# Patient Record
Sex: Female | Born: 1955
Health system: Southern US, Community
[De-identification: ages and names within clinical notes are randomized; demographics above are authoritative.]

## PROBLEM LIST (undated history)

## (undated) DIAGNOSIS — E119 Type 2 diabetes mellitus without complications: Secondary | ICD-10-CM

## (undated) DIAGNOSIS — L309 Dermatitis, unspecified: Secondary | ICD-10-CM

## (undated) DIAGNOSIS — D649 Anemia, unspecified: Secondary | ICD-10-CM

## (undated) DIAGNOSIS — R519 Headache, unspecified: Secondary | ICD-10-CM

## (undated) DIAGNOSIS — M1990 Unspecified osteoarthritis, unspecified site: Secondary | ICD-10-CM

## (undated) DIAGNOSIS — M109 Gout, unspecified: Secondary | ICD-10-CM

## (undated) DIAGNOSIS — I83019 Varicose veins of right lower extremity with ulcer of unspecified site: Secondary | ICD-10-CM

## (undated) DIAGNOSIS — N189 Chronic kidney disease, unspecified: Secondary | ICD-10-CM

## (undated) DIAGNOSIS — I10 Essential (primary) hypertension: Secondary | ICD-10-CM

## (undated) DIAGNOSIS — K219 Gastro-esophageal reflux disease without esophagitis: Secondary | ICD-10-CM

## (undated) DIAGNOSIS — I890 Lymphedema, not elsewhere classified: Secondary | ICD-10-CM

## (undated) HISTORY — PX: LEG SURGERY: SHX1003

## (undated) HISTORY — PX: OTHER SURGICAL HISTORY: SHX169

## (undated) HISTORY — PX: DG RIGHT TIBIA AND FIBULA (ARMC HX): HXRAD1598

---

## 2007-06-01 ENCOUNTER — Emergency Department: Payer: Self-pay

## 2007-10-29 ENCOUNTER — Ambulatory Visit: Payer: Self-pay | Admitting: Gastroenterology

## 2010-01-12 DIAGNOSIS — IMO0002 Reserved for concepts with insufficient information to code with codable children: Secondary | ICD-10-CM | POA: Insufficient documentation

## 2010-01-12 DIAGNOSIS — R000 Tachycardia, unspecified: Secondary | ICD-10-CM | POA: Insufficient documentation

## 2010-01-12 DIAGNOSIS — S129XXA Fracture of neck, unspecified, initial encounter: Secondary | ICD-10-CM | POA: Insufficient documentation

## 2010-01-12 DIAGNOSIS — E0590 Thyrotoxicosis, unspecified without thyrotoxic crisis or storm: Secondary | ICD-10-CM | POA: Insufficient documentation

## 2010-05-01 DIAGNOSIS — T847XXA Infection and inflammatory reaction due to other internal orthopedic prosthetic devices, implants and grafts, initial encounter: Secondary | ICD-10-CM | POA: Insufficient documentation

## 2010-06-04 ENCOUNTER — Ambulatory Visit: Payer: Self-pay | Admitting: Orthopaedic Surgery

## 2014-11-11 ENCOUNTER — Inpatient Hospital Stay: Payer: Self-pay | Admitting: Specialist

## 2014-11-11 LAB — CBC
HCT: 40.1 % (ref 35.0–47.0)
HGB: 13.2 g/dL (ref 12.0–16.0)
MCH: 29 pg (ref 26.0–34.0)
MCHC: 32.9 g/dL (ref 32.0–36.0)
MCV: 88 fL (ref 80–100)
Platelet: 283 10*3/uL (ref 150–440)
RBC: 4.55 10*6/uL (ref 3.80–5.20)
RDW: 14.5 % (ref 11.5–14.5)
WBC: 11 10*3/uL (ref 3.6–11.0)

## 2014-11-11 LAB — COMPREHENSIVE METABOLIC PANEL
ALK PHOS: 97 U/L
ALT: 24 U/L
Albumin: 3 g/dL — ABNORMAL LOW (ref 3.4–5.0)
Anion Gap: 4 — ABNORMAL LOW (ref 7–16)
BILIRUBIN TOTAL: 0.6 mg/dL (ref 0.2–1.0)
BUN: 27 mg/dL — ABNORMAL HIGH (ref 7–18)
CALCIUM: 9.4 mg/dL (ref 8.5–10.1)
CHLORIDE: 99 mmol/L (ref 98–107)
CO2: 31 mmol/L (ref 21–32)
CREATININE: 1.23 mg/dL (ref 0.60–1.30)
GFR CALC AF AMER: 58 — AB
GFR CALC NON AF AMER: 47 — AB
Glucose: 115 mg/dL — ABNORMAL HIGH (ref 65–99)
Osmolality: 274 (ref 275–301)
Potassium: 3.4 mmol/L — ABNORMAL LOW (ref 3.5–5.1)
SGOT(AST): 30 U/L (ref 15–37)
Sodium: 134 mmol/L — ABNORMAL LOW (ref 136–145)
Total Protein: 8.8 g/dL — ABNORMAL HIGH (ref 6.4–8.2)

## 2014-11-12 LAB — CBC WITH DIFFERENTIAL/PLATELET
BASOS PCT: 0.4 %
Basophil #: 0 10*3/uL (ref 0.0–0.1)
EOS PCT: 1.3 %
Eosinophil #: 0.1 10*3/uL (ref 0.0–0.7)
HCT: 35 % (ref 35.0–47.0)
HGB: 11.5 g/dL — ABNORMAL LOW (ref 12.0–16.0)
LYMPHS PCT: 19.3 %
Lymphocyte #: 2 10*3/uL (ref 1.0–3.6)
MCH: 28.7 pg (ref 26.0–34.0)
MCHC: 32.9 g/dL (ref 32.0–36.0)
MCV: 87 fL (ref 80–100)
MONOS PCT: 13 %
Monocyte #: 1.3 x10 3/mm — ABNORMAL HIGH (ref 0.2–0.9)
NEUTROS ABS: 6.7 10*3/uL — AB (ref 1.4–6.5)
NEUTROS PCT: 66 %
Platelet: 251 10*3/uL (ref 150–440)
RBC: 4.01 10*6/uL (ref 3.80–5.20)
RDW: 14.3 % (ref 11.5–14.5)
WBC: 10.2 10*3/uL (ref 3.6–11.0)

## 2014-11-12 LAB — BASIC METABOLIC PANEL
Anion Gap: 8 (ref 7–16)
BUN: 27 mg/dL — ABNORMAL HIGH (ref 7–18)
CHLORIDE: 101 mmol/L (ref 98–107)
CO2: 29 mmol/L (ref 21–32)
Calcium, Total: 8.5 mg/dL (ref 8.5–10.1)
Creatinine: 1.21 mg/dL (ref 0.60–1.30)
EGFR (African American): 59 — ABNORMAL LOW
EGFR (Non-African Amer.): 48 — ABNORMAL LOW
Glucose: 163 mg/dL — ABNORMAL HIGH (ref 65–99)
Osmolality: 284 (ref 275–301)
Potassium: 3.4 mmol/L — ABNORMAL LOW (ref 3.5–5.1)
Sodium: 138 mmol/L (ref 136–145)

## 2014-11-13 LAB — POTASSIUM: Potassium: 3.7 mmol/L (ref 3.5–5.1)

## 2014-11-16 LAB — CULTURE, BLOOD (SINGLE)

## 2014-11-25 DIAGNOSIS — I890 Lymphedema, not elsewhere classified: Secondary | ICD-10-CM | POA: Insufficient documentation

## 2014-11-25 DIAGNOSIS — IMO0002 Reserved for concepts with insufficient information to code with codable children: Secondary | ICD-10-CM | POA: Insufficient documentation

## 2015-01-17 DIAGNOSIS — R05 Cough: Secondary | ICD-10-CM | POA: Insufficient documentation

## 2015-01-17 DIAGNOSIS — T464X5A Adverse effect of angiotensin-converting-enzyme inhibitors, initial encounter: Secondary | ICD-10-CM | POA: Insufficient documentation

## 2015-01-17 DIAGNOSIS — I10 Essential (primary) hypertension: Secondary | ICD-10-CM | POA: Insufficient documentation

## 2015-01-17 DIAGNOSIS — R058 Other specified cough: Secondary | ICD-10-CM | POA: Insufficient documentation

## 2015-01-17 DIAGNOSIS — L309 Dermatitis, unspecified: Secondary | ICD-10-CM | POA: Insufficient documentation

## 2015-01-17 DIAGNOSIS — M19271 Secondary osteoarthritis, right ankle and foot: Secondary | ICD-10-CM | POA: Insufficient documentation
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
## 2015-09-08 ENCOUNTER — Ambulatory Visit (INDEPENDENT_AMBULATORY_CARE_PROVIDER_SITE_OTHER): Payer: BC Managed Care – PPO | Admitting: Physician Assistant

## 2015-09-08 ENCOUNTER — Encounter: Payer: Self-pay | Admitting: Physician Assistant

## 2015-09-08 VITALS — BP 140/80 | HR 83 | Temp 97.6°F | Resp 16 | Ht 62.0 in | Wt 251.2 lb

## 2015-09-08 DIAGNOSIS — Z0000 Encounter for general adult medical examination without abnormal findings: Secondary | ICD-10-CM

## 2015-09-08 DIAGNOSIS — Z7189 Other specified counseling: Secondary | ICD-10-CM

## 2015-09-08 DIAGNOSIS — I890 Lymphedema, not elsewhere classified: Secondary | ICD-10-CM

## 2015-09-08 DIAGNOSIS — E0590 Thyrotoxicosis, unspecified without thyrotoxic crisis or storm: Secondary | ICD-10-CM

## 2015-09-08 DIAGNOSIS — Z7689 Persons encountering health services in other specified circumstances: Secondary | ICD-10-CM

## 2015-09-08 DIAGNOSIS — Z23 Encounter for immunization: Secondary | ICD-10-CM | POA: Diagnosis not present

## 2015-09-08 DIAGNOSIS — I10 Essential (primary) hypertension: Secondary | ICD-10-CM | POA: Diagnosis not present

## 2015-09-08 MED ORDER — FUROSEMIDE 20 MG PO TABS: 20.0000 mg | ORAL_TABLET | Freq: Every day | ORAL | Status: DC

## 2015-09-08 MED ORDER — LOSARTAN POTASSIUM 50 MG PO TABS: 50.0000 mg | ORAL_TABLET | Freq: Every day | ORAL | Status: DC

## 2015-09-08 MED ORDER — METOPROLOL TARTRATE 25 MG PO TABS: 25.0000 mg | ORAL_TABLET | Freq: Every day | ORAL | Status: DC

## 2015-09-08 MED ORDER — TRIAMCINOLONE ACETONIDE 0.1 % EX OINT: TOPICAL_OINTMENT | Freq: Two times a day (BID) | CUTANEOUS | Status: DC

## 2015-09-08 MED ORDER — AMLODIPINE BESYLATE 2.5 MG PO TABS: 2.5000 mg | ORAL_TABLET | Freq: Every day | ORAL | Status: DC

## 2015-09-08 MED ORDER — HYDROCHLOROTHIAZIDE 25 MG PO TABS: 25.0000 mg | ORAL_TABLET | Freq: Every day | ORAL | Status: DC
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
## 2015-09-08 NOTE — Patient Instructions (Addendum)
Menopause is a normal process in which your reproductive ability comes to an end. This process happens gradually over a span of months to years, usually between the ages of 48 and 55. Menopause is complete when you have missed 12 consecutive menstrual periods. It is important to talk with your health care provider about some of the most common conditions that affect postmenopausal women, such as heart disease, cancer, and bone loss (osteoporosis). Adopting a healthy lifestyle and getting preventive care can help to promote your health and wellness. Those actions can also lower your chances of developing some of these common conditions. WHAT SHOULD I KNOW ABOUT MENOPAUSE? During menopause, you may experience a number of symptoms, such as:  Moderate-to-severe hot flashes.  Night sweats.  Decrease in sex drive.  Mood swings.  Headaches.  Tiredness.  Irritability.  Memory problems.  Insomnia. Choosing to treat or not to treat menopausal changes is an individual decision that you make with your health care provider. WHAT SHOULD I KNOW ABOUT HORMONE REPLACEMENT THERAPY AND SUPPLEMENTS? Hormone therapy products are effective for treating symptoms that are associated with menopause, such as hot flashes and night sweats. Hormone replacement carries certain risks, especially as you become older. If you are thinking about using estrogen or estrogen with progestin treatments, discuss the benefits and risks with your health care provider. WHAT SHOULD I KNOW ABOUT HEART DISEASE AND STROKE? Heart disease, heart attack, and stroke become more likely as you age. This may be due, in part, to the hormonal changes that your body experiences during menopause. These can affect how your body processes dietary fats, triglycerides, and cholesterol. Heart attack and stroke are both medical emergencies. There are many things that you can do to help prevent heart disease and stroke:  Have your blood pressure  checked at least every 1-2 years. High blood pressure causes heart disease and increases the risk of stroke.  If you are 55-79 years old, ask your health care provider if you should take aspirin to prevent a heart attack or a stroke.  Do not use any tobacco products, including cigarettes, chewing tobacco, or electronic cigarettes. If you need help quitting, ask your health care provider.  It is important to eat a healthy diet and maintain a healthy weight.  Be sure to include plenty of vegetables, fruits, low-fat dairy products, and lean protein.  Avoid eating foods that are high in solid fats, added sugars, or salt (sodium).  Get regular exercise. This is one of the most important things that you can do for your health.  Try to exercise for at least 150 minutes each week. The type of exercise that you do should increase your heart rate and make you sweat. This is known as moderate-intensity exercise.  Try to do strengthening exercises at least twice each week. Do these in addition to the moderate-intensity exercise.  Know your numbers.Ask your health care provider to check your cholesterol and your blood glucose. Continue to have your blood tested as directed by your health care provider. WHAT SHOULD I KNOW ABOUT CANCER SCREENING? There are several types of cancer. Take the following steps to reduce your risk and to catch any cancer development as early as possible. Breast Cancer  Practice breast self-awareness.  This means understanding how your breasts normally appear and feel.  It also means doing regular breast self-exams. Let your health care provider know about any changes, no matter how small.  If you are 40 or older, have a clinician do a   breast exam (clinical breast exam or CBE) every year. Depending on your age, family history, and medical history, it may be recommended that you also have a yearly breast X-ray (mammogram).  If you have a family history of breast cancer,  talk with your health care provider about genetic screening.  If you are at high risk for breast cancer, talk with your health care provider about having an MRI and a mammogram every year.  Breast cancer (BRCA) gene test is recommended for women who have family members with BRCA-related cancers. Results of the assessment will determine the need for genetic counseling and BRCA1 and for BRCA2 testing. BRCA-related cancers include these types:  Breast. This occurs in males or females.  Ovarian.  Tubal. This may also be called fallopian tube cancer.  Cancer of the abdominal or pelvic lining (peritoneal cancer).  Prostate.  Pancreatic. Cervical, Uterine, and Ovarian Cancer Your health care provider may recommend that you be screened regularly for cancer of the pelvic organs. These include your ovaries, uterus, and vagina. This screening involves a pelvic exam, which includes checking for microscopic changes to the surface of your cervix (Pap test).  For women ages 21-65, health care providers may recommend a pelvic exam and a Pap test every three years. For women ages 77-65, they may recommend the Pap test and pelvic exam, combined with testing for human papilloma virus (HPV), every five years. Some types of HPV increase your risk of cervical cancer. Testing for HPV may also be done on women of any age who have unclear Pap test results.  Other health care providers may not recommend any screening for nonpregnant women who are considered low risk for pelvic cancer and have no symptoms. Ask your health care provider if a screening pelvic exam is right for you.  If you have had past treatment for cervical cancer or a condition that could lead to cancer, you need Pap tests and screening for cancer for at least 20 years after your treatment. If Pap tests have been discontinued for you, your risk factors (such as having a new sexual partner) need to be reassessed to determine if you should start having  screenings again. Some women have medical problems that increase the chance of getting cervical cancer. In these cases, your health care provider may recommend that you have screening and Pap tests more often.  If you have a family history of uterine cancer or ovarian cancer, talk with your health care provider about genetic screening.  If you have vaginal bleeding after reaching menopause, tell your health care provider.  There are currently no reliable tests available to screen for ovarian cancer. Lung Cancer Lung cancer screening is recommended for adults 3-70 years old who are at high risk for lung cancer because of a history of smoking. A yearly low-dose CT scan of the lungs is recommended if you:  Currently smoke.  Have a history of at least 30 pack-years of smoking and you currently smoke or have quit within the past 15 years. A pack-year is smoking an average of one pack of cigarettes per day for one year. Yearly screening should:  Continue until it has been 15 years since you quit.  Stop if you develop a health problem that would prevent you from having lung cancer treatment. Colorectal Cancer  This type of cancer can be detected and can often be prevented.  Routine colorectal cancer screening usually begins at age 38 and continues through age 12.  If you have  risk factors for colon cancer, your health care provider may recommend that you be screened at an earlier age.  If you have a family history of colorectal cancer, talk with your health care provider about genetic screening.  Your health care provider may also recommend using home test kits to check for hidden blood in your stool.  A small camera at the end of a tube can be used to examine your colon directly (sigmoidoscopy or colonoscopy). This is done to check for the earliest forms of colorectal cancer.  Direct examination of the colon should be repeated every 5-10 years until age 67. However, if early forms of  precancerous polyps or small growths are found or if you have a family history or genetic risk for colorectal cancer, you may need to be screened more often. Skin Cancer  Check your skin from head to toe regularly.  Monitor any moles. Be sure to tell your health care provider:  About any new moles or changes in moles, especially if there is a change in a mole's shape or color.  If you have a mole that is larger than the size of a pencil eraser.  If any of your family members has a history of skin cancer, especially at a young age, talk with your health care provider about genetic screening.  Always use sunscreen. Apply sunscreen liberally and repeatedly throughout the day.  Whenever you are outside, protect yourself by wearing long sleeves, pants, a wide-brimmed hat, and sunglasses. WHAT SHOULD I KNOW ABOUT OSTEOPOROSIS? Osteoporosis is a condition in which bone destruction happens more quickly than new bone creation. After menopause, you may be at an increased risk for osteoporosis. To help prevent osteoporosis or the bone fractures that can happen because of osteoporosis, the following is recommended:  If you are 39-61 years old, get at least 1,000 mg of calcium and at least 600 mg of vitamin D per day.  If you are older than age 16 but younger than age 7, get at least 1,200 mg of calcium and at least 600 mg of vitamin D per day.  If you are older than age 47, get at least 1,200 mg of calcium and at least 800 mg of vitamin D per day. Smoking and excessive alcohol intake increase the risk of osteoporosis. Eat foods that are rich in calcium and vitamin D, and do weight-bearing exercises several times each week as directed by your health care provider. WHAT SHOULD I KNOW ABOUT HOW MENOPAUSE AFFECTS [place]? Depression may occur at any age, but it is more common as you become older. Common symptoms of depression include:  Low or sad mood.  Changes in sleep patterns.  Changes  in appetite or eating patterns.  Feeling an overall lack of motivation or enjoyment of activities that you previously enjoyed.  Frequent crying spells. Talk with your health care provider if you think that you are experiencing depression. WHAT SHOULD I KNOW ABOUT IMMUNIZATIONS? It is important that you get and maintain your immunizations. These include:  Tetanus, diphtheria, and pertussis (Tdap) booster vaccine.  Influenza every year before the flu season begins.  Pneumonia vaccine.  Shingles vaccine. Your health care provider may also recommend other immunizations.   This information is not intended to replace advice given to you by your health care provider. Make sure you discuss any questions you have with your health care provider.   Document Released: 12/20/2005 Document Revised: 11/18/2014 Document Reviewed: 06/30/2014 Elsevier Interactive Patient Education 2016 [employer]  Hypertension Hypertension, commonly called high blood pressure, is when the force of blood pumping through your arteries is too strong. Your arteries are the blood vessels that carry blood from your heart throughout your body. A blood pressure reading consists of a higher number over a lower number, such as 110/72. The higher number (systolic) is the pressure inside your arteries when your heart pumps. The lower number (diastolic) is the pressure inside your arteries when your heart relaxes. Ideally you want your blood pressure below 120/80. Hypertension forces your heart to work harder to pump blood. Your arteries may become narrow or stiff. Having untreated or uncontrolled hypertension can cause heart attack, stroke, kidney disease, and other problems. RISK FACTORS Some risk factors for high blood pressure are controllable. Others are not.  Risk factors you cannot control include:   Race. You may be at higher risk if you are African American.  Age. Risk increases with age.  Gender. Men are at higher  risk than women before age 97 years. After age 81, women are at higher risk than men. Risk factors you can control include:  Not getting enough exercise or physical activity.  Being overweight.  Getting too much fat, sugar, calories, or salt in your diet.  Drinking too much alcohol. SIGNS AND SYMPTOMS Hypertension does not usually cause signs or symptoms. Extremely high blood pressure (hypertensive crisis) may cause headache, anxiety, shortness of breath, and nosebleed. DIAGNOSIS To check if you have hypertension, your health care provider will measure your blood pressure while you are seated, with your arm held at the level of your heart. It should be measured at least twice using the same arm. Certain conditions can cause a difference in blood pressure between your right and left arms. A blood pressure reading that is higher than normal on one occasion does not mean that you need treatment. If it is not clear whether you have high blood pressure, you may be asked to return on a different day to have your blood pressure checked again. Or, you may be asked to monitor your blood pressure at home for 1 or more weeks. TREATMENT Treating high blood pressure includes making lifestyle changes and possibly taking medicine. Living a healthy lifestyle can help lower high blood pressure. You may need to change some of your habits. Lifestyle changes may include:  Following the DASH diet. This diet is high in fruits, vegetables, and whole grains. It is low in salt, red meat, and added sugars.  Keep your sodium intake below 2,300 mg per day.  Getting at least 30-45 minutes of aerobic exercise at least 4 times per week.  Losing weight if necessary.  Not smoking.  Limiting alcoholic beverages.  Learning ways to reduce stress. Your health care provider may prescribe medicine if lifestyle changes are not enough to get your blood pressure under control, and if one of the following is true:  You are  85-51 years of age and your systolic blood pressure is above 140.  You are 68 years of age or older, and your systolic blood pressure is above 150.  Your diastolic blood pressure is above 90.  You have diabetes, and your systolic blood pressure is over 161 or your diastolic blood pressure is over 90.  You have kidney disease and your blood pressure is above 140/90.  You have heart disease and your blood pressure is above 140/90. Your personal target blood pressure may vary depending on your medical conditions, your age, and other factors.  HOME CARE INSTRUCTIONS  Have your blood pressure rechecked as directed by your health care provider.   Take medicines only as directed by your health care provider. Follow the directions carefully. Blood pressure medicines must be taken as prescribed. The medicine does not work as well when you skip doses. Skipping doses also puts you at risk for problems.  Do not smoke.   Monitor your blood pressure at home as directed by your health care provider. SEEK MEDICAL CARE IF:   You think you are having a reaction to medicines taken.  You have recurrent headaches or feel dizzy.  You have swelling in your ankles.  You have trouble with your vision. SEEK IMMEDIATE MEDICAL CARE IF:  You develop a severe headache or confusion.  You have unusual weakness, numbness, or feel faint.  You have severe chest or abdominal pain.  You vomit repeatedly.  You have trouble breathing. MAKE SURE YOU:   Understand these instructions.  Will watch your condition.  Will get help right away if you are not doing well or get worse.   This information is not intended to replace advice given to you by your health care provider. Make sure you discuss any questions you have with your health care provider.   Document Released: 10/28/2005 Document Revised: 03/14/2015 Document Reviewed: 08/20/2013 Elsevier Interactive Patient Education 2016 [employer]  [employer]  Losartan tablets What is this medicine? LOSARTAN (loe SAR tan) is used to treat high blood pressure and to reduce the risk of stroke in certain patients. This drug also slows the progression of kidney disease in patients with diabetes. This medicine may be used for other purposes; ask your health care provider or pharmacist if you have questions. What should I tell my health care provider before I take this medicine? They need to know if you have any of these conditions: -heart failure -kidney or liver disease -an unusual or allergic reaction to losartan, other medicines, foods, dyes, or preservatives -pregnant or trying to get pregnant -breast-feeding How should I use this medicine? Take this medicine by mouth with a glass of water. Follow the directions on the prescription label. This medicine can be taken with or without food. Take your doses at regular intervals. Do not take your medicine more often than directed. Talk to your pediatrician regarding the use of this medicine in children. Special care may be needed. Overdosage: If you think you have taken too much of this medicine contact a poison control center or emergency room at once. NOTE: This medicine is only for you. Do not share this medicine with others. What if I miss a dose? If you miss a dose, take it as soon as you can. If it is almost time for your next dose, take only that dose. Do not take double or extra doses. What may interact with this medicine? -blood pressure medicines -diuretics, especially triamterene, spironolactone, or amiloride -fluconazole -NSAIDs, medicines for pain and inflammation, like ibuprofen or naproxen -potassium salts or potassium supplements -rifampin This list may not describe all possible interactions. Give your health care provider a list of all the medicines, herbs, non-prescription drugs, or dietary supplements you use. Also tell them if you smoke, drink alcohol, or use illegal drugs.  Some items may interact with your medicine. What should I watch for while using this medicine? Visit your doctor or health care professional for regular checks on your progress. Check your blood pressure as directed. Ask your doctor or health care professional what your blood pressure  should be and when you should contact him or her. Call your doctor or health care professional if you notice an irregular or fast heart beat. Women should inform their doctor if they wish to become pregnant or think they might be pregnant. There is a potential for serious side effects to an unborn child, particularly in the second or third trimester. Talk to your health care professional or pharmacist for more information. You may get drowsy or dizzy. Do not drive, use machinery, or do anything that needs mental alertness until you know how this drug affects you. Do not stand or sit up quickly, especially if you are an older patient. This reduces the risk of dizzy or fainting spells. Alcohol can make you more drowsy and dizzy. Avoid alcoholic drinks. Avoid salt substitutes unless you are told otherwise by your doctor or health care professional. Do not treat yourself for coughs, colds, or pain while you are taking this medicine without asking your doctor or health care professional for advice. Some ingredients may increase your blood pressure. What side effects may I notice from receiving this medicine? Side effects that you should report to your doctor or health care professional as soon as possible: -confusion, dizziness, light headedness or fainting spells -decreased amount of urine passed -difficulty breathing or swallowing, hoarseness, or tightening of the throat -fast or irregular heart beat, palpitations, or chest pain -skin rash, itching -swelling of your face, lips, tongue, hands, or feet Side effects that usually do not require medical attention (report to your doctor or health care professional if they  continue or are bothersome): -cough -decreased sexual function or desire -headache -nasal congestion or stuffiness -nausea or stomach pain -sore or cramping muscles This list may not describe all possible side effects. Call your doctor for medical advice about side effects. You may report side effects to FDA at 1-800-FDA-1088. Where should I keep my medicine? Keep out of the reach of children. Store at room temperature between 15 and 30 degrees C (59 and 86 degrees F). Protect from light. Keep container tightly closed. Throw away any unused medicine after the expiration date. NOTE: This sheet is a summary. It may not cover all possible information. If you have questions about this medicine, talk to your doctor, pharmacist, or health care provider.    2016, Elsevier/[employer]. (2008-01-08 16:42:18)

## 2015-09-09 LAB — CBC WITH DIFFERENTIAL/PLATELET
BASOS: 0 %
Basophils Absolute: 0 10*3/uL (ref 0.0–0.2)
EOS (ABSOLUTE): 0.1 10*3/uL (ref 0.0–0.4)
EOS: 2 %
HEMATOCRIT: 38.3 % (ref 34.0–46.6)
Hemoglobin: 12.6 g/dL (ref 11.1–15.9)
IMMATURE GRANS (ABS): 0 10*3/uL (ref 0.0–0.1)
IMMATURE GRANULOCYTES: 0 %
LYMPHS: 41 %
Lymphocytes Absolute: 2.2 10*3/uL (ref 0.7–3.1)
MCH: 28.8 pg (ref 26.6–33.0)
MCHC: 32.9 g/dL (ref 31.5–35.7)
MCV: 87 fL (ref 79–97)
MONOS ABS: 0.4 10*3/uL (ref 0.1–0.9)
Monocytes: 7 %
NEUTROS PCT: 50 %
Neutrophils Absolute: 2.6 10*3/uL (ref 1.4–7.0)
PLATELETS: 283 10*3/uL (ref 150–379)
RBC: 4.38 x10E6/uL (ref 3.77–5.28)
RDW: 14.6 % (ref 12.3–15.4)
WBC: 5.3 10*3/uL (ref 3.4–10.8)

## 2015-09-09 LAB — COMPREHENSIVE METABOLIC PANEL
ALT: 12 IU/L (ref 0–32)
AST: 16 IU/L (ref 0–40)
Albumin/Globulin Ratio: 1.3 (ref 1.1–2.5)
Albumin: 4.3 g/dL (ref 3.5–5.5)
Alkaline Phosphatase: 99 IU/L (ref 39–117)
BUN / CREAT RATIO: 17 (ref 9–23)
BUN: 13 mg/dL (ref 6–24)
Bilirubin Total: 0.4 mg/dL (ref 0.0–1.2)
CALCIUM: 9.4 mg/dL (ref 8.7–10.2)
CO2: 27 mmol/L (ref 18–29)
CREATININE: 0.75 mg/dL (ref 0.57–1.00)
Chloride: 99 mmol/L (ref 97–106)
GFR, EST AFRICAN AMERICAN: 101 mL/min/{1.73_m2} (ref >59)
GFR, EST NON AFRICAN AMERICAN: 88 mL/min/{1.73_m2} (ref >59)
Globulin, Total: 3.3 g/dL (ref 1.5–4.5)
Glucose: 104 mg/dL — ABNORMAL HIGH (ref 65–99)
Potassium: 4.4 mmol/L (ref 3.5–5.2)
Sodium: 141 mmol/L (ref 136–144)
TOTAL PROTEIN: 7.6 g/dL (ref 6.0–8.5)

## 2015-09-09 LAB — TSH: TSH: 0.69 u[IU]/mL (ref 0.450–4.500)

## 2015-09-09 LAB — LIPID PANEL
CHOL/HDL RATIO: 2.3 ratio (ref 0.0–4.4)
Cholesterol, Total: 166 mg/dL (ref 100–199)
HDL: 72 mg/dL (ref >39)
LDL Calculated: 81 mg/dL (ref 0–99)
TRIGLYCERIDES: 66 mg/dL (ref 0–149)
VLDL Cholesterol Cal: 13 mg/dL (ref 5–40)

## 2015-09-11 ENCOUNTER — Telehealth: Payer: Self-pay

## 2015-09-11 NOTE — Telephone Encounter (Signed)
-----   Message from Mar Daring, PA-C sent at 09/11/2015  8:30 AM EDT ----- All labs are stable and WNL

## 2015-09-11 NOTE — Telephone Encounter (Signed)
Patient advised as directed below.  Thanks,  -Joseline 

## 2016-03-06 ENCOUNTER — Encounter: Payer: Self-pay | Admitting: Physician Assistant

## 2016-03-06 ENCOUNTER — Ambulatory Visit (INDEPENDENT_AMBULATORY_CARE_PROVIDER_SITE_OTHER): Payer: BC Managed Care – PPO | Admitting: Physician Assistant

## 2016-03-06 VITALS — BP 160/90 | HR 70 | Temp 98.0°F | Resp 16 | Wt 261.4 lb

## 2016-03-06 DIAGNOSIS — I10 Essential (primary) hypertension: Secondary | ICD-10-CM

## 2016-03-06 DIAGNOSIS — Z6841 Body Mass Index (BMI) 40.0 and over, adult: Secondary | ICD-10-CM

## 2016-03-06 MED ORDER — LOSARTAN POTASSIUM 50 MG PO TABS: 50.0000 mg | ORAL_TABLET | Freq: Every day | ORAL | Status: DC

## 2016-03-06 NOTE — Progress Notes (Signed)
Patient: Autumn Burton Female    DOB: 1956/08/07   60 y.o.   MRN: [id number] Visit Date: 03/06/2016  Today's Provider: Mar Daring, PA-C   Chief Complaint  Patient presents with  . Follow-up    Essential Hypertension   Subjective:    HPI  Hypertension, follow-up:  BP Readings from Last 3 Encounters:  03/06/16 160/90  09/08/15 140/80    She was last seen for hypertension 6 months ago.  BP at that visit was 140/80. Management since that visit included to discontinued Lisinopril. She was put on Losartan. Amlodipine, Hydrochlorothiazide and Metoprolol were refilled and to have been continued along with the Furosemide for the leg swelling (lymphedema). Patient is not taking Losartan, Amlodipine and Furosemide; doesn't remember that she needed all these medications.   She reports she has a lot going on right now. She is taking care of her husband and mother who are both sick. She states she worries about them both a lot. She does not sleep deeply because she is constantly listening for her husband's breathing. She cannot remember things to do for herself as she is constantly making sure she has taken care of them. She states she sits with her mother during the day at her sister's house and tries to take her husband there also so she only has to watch them at one place. Some days her husband refuses to go over there so she has to go back and forth throughout the day to keep checking on him as well.   She reports good compliance with treatment. She thought the only medicines she needed to be taking was the Metoprolol, Aspirin, and Hydrochlorothiazide. She is not having side effects.  She is not exercising. She is starting to have bilateral knee pain but she is morbidly obese and also has lymphedema complicating issues.  She is adherent to low salt diet.   Outside blood pressures are systolic in the upper [id number] and the diastolic upper [id number] per patient. She is using her  mother's blood pressure cuff.  She is experiencing lower extremity edema.  Patient denies chest pain/pressure, headache, dizziness,fatigue, irregular heart beat, or palpitations. Cardiovascular risk factors include hypertension and obesity (BMI >= 30 kg/m2).    Weight trend: fluctuating a bit Wt Readings from Last 3 Encounters:  03/06/16 261 lb 6.4 oz (118.57 kg)  09/08/15 251 lb 3.2 oz (113.944 kg)    Current diet: in general, an "unhealthy" diet. She states most days she is only eating dinner because she is too busy doing for everyone else.   ------------------------------------------------------------------------      No Known Allergies Previous Medications   ASPIRIN EC 81 MG TABLET    Take by mouth.   FUROSEMIDE (LASIX) 20 MG TABLET    Take 1 tablet (20 mg total) by mouth daily.   HYDROCHLOROTHIAZIDE (HYDRODIURIL) 25 MG TABLET    Take 1 tablet (25 mg total) by mouth daily.   METOPROLOL TARTRATE (LOPRESSOR) 25 MG TABLET    Take 1 tablet (25 mg total) by mouth daily.   TRIAMCINOLONE OINTMENT (KENALOG) 0.1 %    Apply topically 2 (two) times daily.    Review of Systems  Constitutional: Negative for fatigue.  Eyes: Negative for visual disturbance.  Respiratory: Negative for cough, chest tightness and shortness of breath.   Cardiovascular: Positive for leg swelling. Negative for chest pain and palpitations.  Gastrointestinal: Negative.   Endocrine: Negative for polydipsia.  Musculoskeletal: Positive for arthralgias (bilateral  knees).  Neurological: Negative for dizziness, light-headedness and headaches.    Social History  Substance Use Topics  . Smoking status: Never Smoker   . Smokeless tobacco: Never Used  . Alcohol Use: No   Objective:   BP 160/90 mmHg  Pulse 70  Temp(Src) 98 F (36.7 C) (Oral)  Resp 16  Wt 261 lb 6.4 oz (118.57 kg)  Physical Exam  Constitutional: She appears well-developed and well-nourished. No distress.  Cardiovascular: Normal rate, regular  rhythm and normal heart sounds.  Exam reveals no gallop and no friction rub.   No murmur heard. Pulmonary/Chest: Effort normal and breath sounds normal. No respiratory distress. She has no wheezes. She has no rales.  Skin: She is not diaphoretic.  Vitals reviewed.       Assessment & Plan:     1. Essential (primary) hypertension Will add losartan for better control of hypertension. She voiced understanding and reiterated the medications she is to be taking daily. I will have her return in 3 months to recheck labs and see how her BP is maintaining with this addition. She is to continue HCTZ, metoprolol as well. Discontinued Norvasc for the time being since she is not taking and has not been taking since before she established with me. She is to call if she has any acute issue, questions, concerns or adverse reaction to the medication.  - losartan (COZAAR) 50 MG tablet; Take 1 tablet (50 mg total) by mouth daily.  Dispense: 90 tablet; Refill: 0  2. Morbid obesity with BMI of 45.0-49.9, adult ([place]) Discussed trying to eat a smaller meal for dinner and work on adding breakfast to her routine to help with memory and energy during the day. Try finding time for adding physical activity in small 5 minute spurts. Consider food diary.        Mar Daring, PA-C  [facility]

## 2016-03-06 NOTE — Patient Instructions (Signed)
Losartan tablets What is this medicine? LOSARTAN (loe SAR tan) is used to treat high blood pressure and to reduce the risk of stroke in certain patients. This drug also slows the progression of kidney disease in patients with diabetes. This medicine may be used for other purposes; ask your health care provider or pharmacist if you have questions. What should I tell my health care provider before I take this medicine? They need to know if you have any of these conditions: -heart failure -kidney or liver disease -an unusual or allergic reaction to losartan, other medicines, foods, dyes, or preservatives -pregnant or trying to get pregnant -breast-feeding How should I use this medicine? Take this medicine by mouth with a glass of water. Follow the directions on the prescription label. This medicine can be taken with or without food. Take your doses at regular intervals. Do not take your medicine more often than directed. Talk to your pediatrician regarding the use of this medicine in children. Special care may be needed. Overdosage: If you think you have taken too much of this medicine contact a poison control center or emergency room at once. NOTE: This medicine is only for you. Do not share this medicine with others. What if I miss a dose? If you miss a dose, take it as soon as you can. If it is almost time for your next dose, take only that dose. Do not take double or extra doses. What may interact with this medicine? -blood pressure medicines -diuretics, especially triamterene, spironolactone, or amiloride -fluconazole -NSAIDs, medicines for pain and inflammation, like ibuprofen or naproxen -potassium salts or potassium supplements -rifampin This list may not describe all possible interactions. Give your health care provider a list of all the medicines, herbs, non-prescription drugs, or dietary supplements you use. Also tell them if you smoke, drink alcohol, or use illegal drugs. Some items  may interact with your medicine. What should I watch for while using this medicine? Visit your doctor or health care professional for regular checks on your progress. Check your blood pressure as directed. Ask your doctor or health care professional what your blood pressure should be and when you should contact him or her. Call your doctor or health care professional if you notice an irregular or fast heart beat. Women should inform their doctor if they wish to become pregnant or think they might be pregnant. There is a potential for serious side effects to an unborn child, particularly in the second or third trimester. Talk to your health care professional or pharmacist for more information. You may get drowsy or dizzy. Do not drive, use machinery, or do anything that needs mental alertness until you know how this drug affects you. Do not stand or sit up quickly, especially if you are an older patient. This reduces the risk of dizzy or fainting spells. Alcohol can make you more drowsy and dizzy. Avoid alcoholic drinks. Avoid salt substitutes unless you are told otherwise by your doctor or health care professional. Do not treat yourself for coughs, colds, or pain while you are taking this medicine without asking your doctor or health care professional for advice. Some ingredients may increase your blood pressure. What side effects may I notice from receiving this medicine? Side effects that you should report to your doctor or health care professional as soon as possible: -confusion, dizziness, light headedness or fainting spells -decreased amount of urine passed -difficulty breathing or swallowing, hoarseness, or tightening of the throat -fast or irregular heart beat, palpitations,   or chest pain -skin rash, itching -swelling of your face, lips, tongue, hands, or feet Side effects that usually do not require medical attention (report to your doctor or health care professional if they continue or are  bothersome): -cough -decreased sexual function or desire -headache -nasal congestion or stuffiness -nausea or stomach pain -sore or cramping muscles This list may not describe all possible side effects. Call your doctor for medical advice about side effects. You may report side effects to FDA at 1-800-FDA-1088. Where should I keep my medicine? Keep out of the reach of children. Store at room temperature between 15 and 30 degrees C (59 and 86 degrees F). Protect from light. Keep container tightly closed. Throw away any unused medicine after the expiration date. NOTE: This sheet is a summary. It may not cover all possible information. If you have questions about this medicine, talk to your doctor, pharmacist, or health care provider.    2016, Elsevier/Gold Standard. (2008-01-08 16:42:18)  

## 2016-03-26 ENCOUNTER — Encounter: Payer: Self-pay | Admitting: Family Medicine

## 2016-03-26 ENCOUNTER — Ambulatory Visit
Admission: RE | Admit: 2016-03-26 | Discharge: 2016-03-26 | Disposition: A | Discharge status: Home / Self Care | Payer: BC Managed Care – PPO | Source: Ambulatory Visit | Attending: Family Medicine | Admitting: Family Medicine

## 2016-03-26 ENCOUNTER — Ambulatory Visit (INDEPENDENT_AMBULATORY_CARE_PROVIDER_SITE_OTHER): Payer: BC Managed Care – PPO | Admitting: Family Medicine

## 2016-03-26 VITALS — BP 130/70 | HR 92 | Temp 97.4°F | Resp 16 | Wt 261.8 lb

## 2016-03-26 DIAGNOSIS — M25562 Pain in left knee: Secondary | ICD-10-CM | POA: Diagnosis not present

## 2016-03-26 DIAGNOSIS — M1712 Unilateral primary osteoarthritis, left knee: Secondary | ICD-10-CM | POA: Insufficient documentation

## 2016-03-26 MED ORDER — HYDROCODONE-ACETAMINOPHEN 5-325 MG PO TABS: ORAL_TABLET | ORAL | Status: DC

## 2016-03-26 MED ORDER — MELOXICAM 15 MG PO TABS: 15.0000 mg | ORAL_TABLET | Freq: Every day | ORAL | Status: DC

## 2016-03-26 NOTE — Patient Instructions (Signed)
We will call you with the x-ray report. Don't take ibuprofen or Aleve with the meloxicam. Limit daily total of tylenol to 3000 mg/day.

## 2016-03-26 NOTE — Progress Notes (Signed)
Subjective:     Patient ID: Autumn Burton, female   DOB: [birth year], 60 y.o.   MRN: [id number]  HPI  Chief Complaint  Patient presents with  . Knee Pain    Patient comes in office today with concerns of sharp stabbing knee pain that has been occuring over the period of 3 weeks. Patient states that pain is worse at night, she has noticed redness and swelling. Patient denies any injury or fall, patient reports difficulty walking and sleeping at night. Patient states that last night she took 1000mg  of Tylenol with no relief.   States redness has resolved but continues to have both W.B.and N.W.B. Pain. 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 Prior right leg injury limits w.b. On that side. Not currently using as assistive device. Has also been taking high dose ibuprofen 3 x day.   Review of Systems  Constitutional: Negative for fever and chills.       Objective:   Physical Exam  Constitutional: She appears well-developed and well-nourished. She appears distressed (pain with standing and w.b.).  Musculoskeletal:  Left knee with mild increased warmth but no sign of infection. Tender over the medial aspect with increased pain with full extension and flexion > 90 degrees. No patellar tenderness. Collateral ligaments stable.       Assessment:    1. Knee pain, acute, left: Suspect osteoarthritis flare - DG Knee Complete 4 Views Left; Future - meloxicam (MOBIC) 15 MG tablet; Take 1 tablet (15 mg total) by mouth daily.  Dispense: 30 tablet; Refill: 0 - HYDROcodone-acetaminophen (NORCO/VICODIN) 5-325 MG tablet; 1/2 to one every 4-6 hours as needed for pain  Dispense: 28 tablet; Refill: 0    Plan:    Further f/u pending x-ray with probable orthopedic referral. Discussed nsaid and tylenol use.

## 2016-03-27 ENCOUNTER — Telehealth: Payer: Self-pay

## 2016-03-27 NOTE — Telephone Encounter (Signed)
-----   Message from Carmon Ginsberg, [place] sent at 03/26/2016  4:46 PM EDT ----- Arthritic changes noted particularly on the side you are hurting. Do you wish to try the new medication for a week or two or wish to see an orthopedic doctor now.

## 2016-03-27 NOTE — Telephone Encounter (Signed)
Patient advised as directed below. Patient prefers to try medication first before proceeding with ortho referral. Pharmacy: [facility].

## 2016-05-06 ENCOUNTER — Other Ambulatory Visit: Payer: Self-pay | Admitting: Family Medicine

## 2016-06-05 ENCOUNTER — Ambulatory Visit (INDEPENDENT_AMBULATORY_CARE_PROVIDER_SITE_OTHER): Payer: BC Managed Care – PPO | Admitting: Physician Assistant

## 2016-06-05 ENCOUNTER — Encounter: Payer: Self-pay | Admitting: Physician Assistant

## 2016-06-05 VITALS — BP 132/70 | Temp 97.6°F | Resp 16 | Wt 260.0 lb

## 2016-06-05 DIAGNOSIS — L309 Dermatitis, unspecified: Secondary | ICD-10-CM

## 2016-06-05 DIAGNOSIS — I10 Essential (primary) hypertension: Secondary | ICD-10-CM | POA: Diagnosis not present

## 2016-06-05 MED ORDER — MOMETASONE FUROATE 0.1 % EX CREA: 1.0000 "application " | TOPICAL_CREAM | Freq: Two times a day (BID) | CUTANEOUS | 0 refills | Status: DC

## 2016-06-05 MED ORDER — LOSARTAN POTASSIUM 50 MG PO TABS: 50.0000 mg | ORAL_TABLET | Freq: Every day | ORAL | 1 refills | Status: DC

## 2016-06-05 NOTE — Progress Notes (Signed)
Patient: Autumn Burton Female    DOB: 11-10-1956   60 y.o.   MRN: [id number] Visit Date: 06/05/2016  Today's Provider: Mar Daring, PA-C   Chief Complaint  Patient presents with  . Hypertension    1 month follow up    Subjective:    HPI  Hypertension, follow-up:  BP Readings from Last 3 Encounters:  06/05/16 132/70  03/26/16 130/70  03/06/16 (!) 160/90    She was last seen for hypertension 3 months ago.  BP at that visit was 130/70. Management since that visit includes adding Losartan 50mg  daily. She reports good compliance with treatment. She is not having side effects.  She is not exercising. She is not adherent to low salt diet.   Outside blood pressures are not being checked. Patient denies chest pain, exertional chest pressure/discomfort, fatigue, irregular heart beat, palpitations and tachypnea.       Weight trend: stable Wt Readings from Last 3 Encounters:  06/05/16 260 lb (117.9 kg)  03/26/16 261 lb 12.8 oz (118.8 kg)  03/06/16 261 lb 6.4 oz (118.6 kg)    Current diet: well balanced  ------------------------------------------------------------------------      No Known Allergies Current Meds  Medication Sig  . aspirin EC 81 MG tablet Take by mouth.  . furosemide (LASIX) 20 MG tablet Take 1 tablet (20 mg total) by mouth daily.  . hydrochlorothiazide (HYDRODIURIL) 25 MG tablet Take 1 tablet (25 mg total) by mouth daily.  Marland Kitchen HYDROcodone-acetaminophen (NORCO/VICODIN) 5-325 MG tablet 1/2 to one every 4-6 hours as needed for pain  . losartan (COZAAR) 50 MG tablet Take 1 tablet (50 mg total) by mouth daily.  . meloxicam (MOBIC) 15 MG tablet TAKE ONE TABLET BY MOUTH ONCE DAILY  . metoprolol tartrate (LOPRESSOR) 25 MG tablet Take 1 tablet (25 mg total) by mouth daily.  Marland Kitchen triamcinolone ointment (KENALOG) 0.1 % Apply topically 2 (two) times daily. (Patient taking differently: Apply topically 2 (two) times daily. )    Review of Systems    Constitutional: Negative.   Respiratory: Negative.   Cardiovascular: Negative.   Musculoskeletal: Negative.   Skin: Negative.     Social History  Substance Use Topics  . Smoking status: Never Smoker  . Smokeless tobacco: Never Used  . Alcohol use No   Objective:   BP 132/70 (BP Location: Right Arm, Patient Position: Sitting, Cuff Size: Large)   Temp 97.6 F (36.4 C)   Resp 16   Wt 260 lb (117.9 kg)   BMI 47.55 kg/m   Physical Exam  Constitutional: She appears well-developed and well-nourished. No distress.  Neck: Normal range of motion. Neck supple. No tracheal deviation present. No thyromegaly present.  Cardiovascular: Normal rate, regular rhythm and normal heart sounds.  Exam reveals no gallop and no friction rub.   No murmur heard. Pulmonary/Chest: Effort normal and breath sounds normal. No respiratory distress. She has no wheezes. She has no rales.  Lymphadenopathy:    She has no cervical adenopathy.  Skin: She is not diaphoretic.  Vitals reviewed.     Assessment & Plan:     1. Essential (primary) hypertension Improved and stable. Diagnosis pulled for medication refill. Continue current medical treatment plan. Will check labs as below and f/u pending results. I will see her back in 3 months for CPE. - losartan (COZAAR) 50 MG tablet; Take 1 tablet (50 mg total) by mouth daily.  Dispense: 90 tablet; Refill: 1 - Basic metabolic panel - CBC  2. Eczema Uncontrolled with triamcinolone cream. WIll try mometasone as below. If still no improvement she is to call the office as we may refer to dermatology. - mometasone (ELOCON) 0.1 % cream; Apply 1 application topically 2 (two) times daily. To affected areas  Dispense: 50 g; Refill: 0  The entirety of the information documented in the History of Present Illness, Review of Systems and Physical Exam were personally obtained by me. Portions of this information were initially documented by Wilburt Finlay, CMA and reviewed by me  for thoroughness and accuracy.      Mar Daring, PA-C  [facility]

## 2016-06-05 NOTE — Patient Instructions (Signed)
Hypertension Hypertension, commonly called high blood pressure, is when the force of blood pumping through your arteries is too strong. Your arteries are the blood vessels that carry blood from your heart throughout your body. A blood pressure reading consists of a higher number over a lower number, such as 110/72. The higher number (systolic) is the pressure inside your arteries when your heart pumps. The lower number (diastolic) is the pressure inside your arteries when your heart relaxes. Ideally you want your blood pressure below 120/80. Hypertension forces your heart to work harder to pump blood. Your arteries may become narrow or stiff. Having untreated or uncontrolled hypertension can cause heart attack, stroke, kidney disease, and other problems. RISK FACTORS Some risk factors for high blood pressure are controllable. Others are not.  Risk factors you cannot control include:   Race. You may be at higher risk if you are African American.  Age. Risk increases with age.  Gender. Men are at higher risk than women before age 45 years. After age 65, women are at higher risk than men. Risk factors you can control include:  Not getting enough exercise or physical activity.  Being overweight.  Getting too much fat, sugar, calories, or salt in your diet.  Drinking too much alcohol. SIGNS AND SYMPTOMS Hypertension does not usually cause signs or symptoms. Extremely high blood pressure (hypertensive crisis) may cause headache, anxiety, shortness of breath, and nosebleed. DIAGNOSIS To check if you have hypertension, your health care provider will measure your blood pressure while you are seated, with your arm held at the level of your heart. It should be measured at least twice using the same arm. Certain conditions can cause a difference in blood pressure between your right and left arms. A blood pressure reading that is higher than normal on one occasion does not mean that you need treatment. If  it is not clear whether you have high blood pressure, you may be asked to return on a different day to have your blood pressure checked again. Or, you may be asked to monitor your blood pressure at home for 1 or more weeks. TREATMENT Treating high blood pressure includes making lifestyle changes and possibly taking medicine. Living a healthy lifestyle can help lower high blood pressure. You may need to change some of your habits. Lifestyle changes may include:  Following the DASH diet. This diet is high in fruits, vegetables, and whole grains. It is low in salt, red meat, and added sugars.  Keep your sodium intake below 2,300 mg per day.  Getting at least 30-45 minutes of aerobic exercise at least 4 times per week.  Losing weight if necessary.  Not smoking.  Limiting alcoholic beverages.  Learning ways to reduce stress. Your health care provider may prescribe medicine if lifestyle changes are not enough to get your blood pressure under control, and if one of the following is true:  You are 18-59 years of age and your systolic blood pressure is above 140.  You are 60 years of age or older, and your systolic blood pressure is above 150.  Your diastolic blood pressure is above 90.  You have diabetes, and your systolic blood pressure is over 140 or your diastolic blood pressure is over 90.  You have kidney disease and your blood pressure is above 140/90.  You have heart disease and your blood pressure is above 140/90. Your personal target blood pressure may vary depending on your medical conditions, your age, and other factors. HOME CARE INSTRUCTIONS    Have your blood pressure rechecked as directed by your health care provider.   Take medicines only as directed by your health care provider. Follow the directions carefully. Blood pressure medicines must be taken as prescribed. The medicine does not work as well when you skip doses. Skipping doses also puts you at risk for  problems.  Do not smoke.   Monitor your blood pressure at home as directed by your health care provider. SEEK MEDICAL CARE IF:   You think you are having a reaction to medicines taken.  You have recurrent headaches or feel dizzy.  You have swelling in your ankles.  You have trouble with your vision. SEEK IMMEDIATE MEDICAL CARE IF:  You develop a severe headache or confusion.  You have unusual weakness, numbness, or feel faint.  You have severe chest or abdominal pain.  You vomit repeatedly.  You have trouble breathing. MAKE SURE YOU:   Understand these instructions.  Will watch your condition.  Will get help right away if you are not doing well or get worse.   This information is not intended to replace advice given to you by your health care provider. Make sure you discuss any questions you have with your health care provider.   Document Released: 10/28/2005 Document Revised: 03/14/2015 Document Reviewed: 08/20/2013 Elsevier Interactive Patient Education 2016 Elsevier Inc.  

## 2016-06-06 LAB — BASIC METABOLIC PANEL
BUN / CREAT RATIO: 18 (ref 12–28)
BUN: 16 mg/dL (ref 8–27)
CALCIUM: 9.5 mg/dL (ref 8.7–10.3)
CO2: 27 mmol/L (ref 18–29)
CREATININE: 0.9 mg/dL (ref 0.57–1.00)
Chloride: 96 mmol/L (ref 96–106)
GFR calc Af Amer: 80 mL/min/{1.73_m2} (ref >59)
GFR calc non Af Amer: 70 mL/min/{1.73_m2} (ref >59)
GLUCOSE: 109 mg/dL — AB (ref 65–99)
Potassium: 3.8 mmol/L (ref 3.5–5.2)
Sodium: 141 mmol/L (ref 134–144)

## 2016-06-06 LAB — CBC
HEMATOCRIT: 36.7 % (ref 34.0–46.6)
HEMOGLOBIN: 11.9 g/dL (ref 11.1–15.9)
MCH: 28.3 pg (ref 26.6–33.0)
MCHC: 32.4 g/dL (ref 31.5–35.7)
MCV: 87 fL (ref 79–97)
Platelets: 346 10*3/uL (ref 150–379)
RBC: 4.2 x10E6/uL (ref 3.77–5.28)
RDW: 15.1 % (ref 12.3–15.4)
WBC: 6.3 10*3/uL (ref 3.4–10.8)

## 2016-07-08 ENCOUNTER — Telehealth: Payer: Self-pay | Admitting: Physician Assistant

## 2016-07-08 NOTE — Telephone Encounter (Signed)
Pt called needing refill on her execma medication cream.  She wants to large jar. ( [place]) ?  She uses [address].  Her call back is [phone]  Thanks Con Memos

## 2016-07-09 ENCOUNTER — Other Ambulatory Visit: Payer: Self-pay | Admitting: Family Medicine

## 2016-07-09 DIAGNOSIS — L309 Dermatitis, unspecified: Secondary | ICD-10-CM

## 2016-07-09 MED ORDER — TRIAMCINOLONE ACETONIDE 0.025 % EX OINT: 1.0000 "application " | TOPICAL_OINTMENT | Freq: Two times a day (BID) | CUTANEOUS | 2 refills | Status: DC

## 2016-07-09 NOTE — Telephone Encounter (Signed)
done

## 2016-07-09 NOTE — Telephone Encounter (Signed)
I am not sure if this is the same thing that [facility] sent back yesterday.  Pt states they are not the same Rx.    Pt contacted office for refill request on the following medications:  TRIAMCINOLONE OINTMENT (KENALOG) 0.1 %.  Pt is requesting the 1 pound jar.  [place]  CB#[id number]/MW

## 2016-07-09 NOTE — Telephone Encounter (Signed)
Please review. KW 

## 2016-07-09 NOTE — Telephone Encounter (Signed)
Spoke with patient on the phone she states that she is request a pound of triamcinolone oint, she states that she takes this for maintenance. Patient stated that when she saw Patrici Ranks her skin was so bad that she agreed to try the Mometasone cream.

## 2016-07-09 NOTE — Telephone Encounter (Signed)
Please clarify with the patient. Jennie switched her to Mometasone cream at the end of last month.

## 2016-09-04 ENCOUNTER — Ambulatory Visit (INDEPENDENT_AMBULATORY_CARE_PROVIDER_SITE_OTHER): Payer: BC Managed Care – PPO | Admitting: Physician Assistant

## 2016-09-04 ENCOUNTER — Encounter: Payer: Self-pay | Admitting: Physician Assistant

## 2016-09-04 VITALS — BP 120/70 | HR 85 | Temp 97.8°F | Resp 16 | Ht 60.0 in | Wt 264.0 lb

## 2016-09-04 DIAGNOSIS — Z833 Family history of diabetes mellitus: Secondary | ICD-10-CM | POA: Diagnosis not present

## 2016-09-04 DIAGNOSIS — Z1211 Encounter for screening for malignant neoplasm of colon: Secondary | ICD-10-CM

## 2016-09-04 DIAGNOSIS — I10 Essential (primary) hypertension: Secondary | ICD-10-CM

## 2016-09-04 DIAGNOSIS — Z23 Encounter for immunization: Secondary | ICD-10-CM | POA: Diagnosis not present

## 2016-09-04 DIAGNOSIS — Z1159 Encounter for screening for other viral diseases: Secondary | ICD-10-CM

## 2016-09-04 DIAGNOSIS — Z0000 Encounter for general adult medical examination without abnormal findings: Secondary | ICD-10-CM | POA: Diagnosis not present

## 2016-09-04 DIAGNOSIS — I890 Lymphedema, not elsewhere classified: Secondary | ICD-10-CM | POA: Diagnosis not present

## 2016-09-04 DIAGNOSIS — Z13220 Encounter for screening for lipoid disorders: Secondary | ICD-10-CM

## 2016-09-04 DIAGNOSIS — L309 Dermatitis, unspecified: Secondary | ICD-10-CM

## 2016-09-04 DIAGNOSIS — E0590 Thyrotoxicosis, unspecified without thyrotoxic crisis or storm: Secondary | ICD-10-CM | POA: Diagnosis not present

## 2016-09-04 DIAGNOSIS — Z124 Encounter for screening for malignant neoplasm of cervix: Secondary | ICD-10-CM

## 2016-09-04 DIAGNOSIS — Z136 Encounter for screening for cardiovascular disorders: Secondary | ICD-10-CM

## 2016-09-04 DIAGNOSIS — Z1231 Encounter for screening mammogram for malignant neoplasm of breast: Secondary | ICD-10-CM | POA: Diagnosis not present

## 2016-09-04 DIAGNOSIS — Z1239 Encounter for other screening for malignant neoplasm of breast: Secondary | ICD-10-CM

## 2016-09-04 LAB — POCT URINALYSIS DIPSTICK
Bilirubin, UA: NEGATIVE
Blood, UA: NEGATIVE
Glucose, UA: NEGATIVE
Ketones, UA: NEGATIVE
LEUKOCYTES UA: NEGATIVE
NITRITE UA: NEGATIVE
PH UA: 5
PROTEIN UA: NEGATIVE
Spec Grav, UA: 1.02
Urobilinogen, UA: 0.2

## 2016-09-04 MED ORDER — FUROSEMIDE 20 MG PO TABS: 20.0000 mg | ORAL_TABLET | Freq: Every day | ORAL | 3 refills | Status: DC

## 2016-09-04 MED ORDER — LOSARTAN POTASSIUM 50 MG PO TABS
50.0000 mg | ORAL_TABLET | Freq: Every day | ORAL | 3 refills | Status: DC
Start: 2016-09-04 — End: 2017-09-09

## 2016-09-04 MED ORDER — PREDNISONE 10 MG PO TABS: ORAL_TABLET | ORAL | 0 refills | Status: DC

## 2016-09-04 MED ORDER — TRIAMCINOLONE ACETONIDE 0.025 % EX OINT: 1.0000 "application " | TOPICAL_OINTMENT | Freq: Two times a day (BID) | CUTANEOUS | 3 refills | Status: DC

## 2016-09-04 MED ORDER — METOPROLOL TARTRATE 25 MG PO TABS: 25.0000 mg | ORAL_TABLET | Freq: Every day | ORAL | 3 refills | Status: DC

## 2016-09-04 MED ORDER — HYDROCHLOROTHIAZIDE 25 MG PO TABS: 25.0000 mg | ORAL_TABLET | Freq: Every day | ORAL | 3 refills | Status: DC

## 2016-09-04 NOTE — Patient Instructions (Signed)

## 2016-09-04 NOTE — Progress Notes (Signed)
Patient: Autumn Burton, Female    DOB: [birth year], 60 y.o.   MRN: [id number] Visit Date: 09/04/2016  Today's Provider: Mar Daring, PA-C   Chief Complaint  Patient presents with  . Annual Exam   Subjective:    Annual physical exam Autumn Burton is a 60 y.o. female who presents today for health maintenance and complete physical. She feels well. She reports exercising 2 days a week. She reports she is sleeping well.  CPE:09/08/15 Pap-04/01/2011 Neg ([facility]) Mammogram:04/07/15-BI-RADS 1 colonscopy done in 09/2011 per pt and need to be recheck in 5 yrs.   Hypertension, follow-up:  BP Readings from Last 3 Encounters:  09/04/16 120/70  06/05/16 132/70  03/26/16 130/70    She was last seen for hypertension 6 months ago.  BP at that visit was 160/90. Management since that visit includes added Losartan. Continue HCTZ,Metoprolol and D/c Norvasc. She reports excellent compliance with treatment. She is not having side effects.  She is exercising. She is adherent to low salt diet.   Outside blood pressures are stable. She is experiencing lower extremity edema.  Patient denies chest pain.   Cardiovascular risk factors include hypertension and obesity (BMI >= 30 kg/m2).  Use of agents associated with hypertension: none.     Weight trend: stable Wt Readings from Last 3 Encounters:  09/04/16 264 lb (119.7 kg)  06/05/16 260 lb (117.9 kg)  03/26/16 261 lb 12.8 oz (118.8 kg)    Current diet: in general, an "unhealthy" diet  ------------------------------------------------------------------------  Patient c/o itchy rash on toes. Patient reports it is worsening.   Review of Systems  Constitutional: Negative.   HENT: Negative.   Eyes: Negative.   Respiratory: Negative.   Cardiovascular: Negative.   Gastrointestinal: Negative.   Endocrine: Negative.   Genitourinary: Negative.   Musculoskeletal: Negative.   Skin: Positive for rash.    Allergic/Immunologic: Negative.   Neurological: Negative.   Hematological: Negative.   Psychiatric/Behavioral: Negative.     Social History      She  reports that she has never smoked. She has never used smokeless tobacco. She reports that she does not drink alcohol or use drugs.       Social History   Social History  . Marital status: Married    Spouse name: N/A  . Number of children: 1  . Years of education: N/A   Social History Main Topics  . Smoking status: Never Smoker  . Smokeless tobacco: Never Used  . Alcohol use No  . Drug use: No  . Sexual activity: Not Asked   Other Topics Concern  . None   Social History Narrative  . None    History reviewed. No pertinent past medical history.   Patient Active Problem List   Diagnosis Date Noted  . Morbid obesity with BMI of 45.0-49.9, adult ([place]) 03/06/2016  . Chronic eczema 01/17/2015  . Adverse effect of angiotensin-converting enzyme inhibitor 01/17/2015  . Essential (primary) hypertension 01/17/2015  . Secondary osteoarthritis of ankle 01/17/2015  . Abscess or cellulitis of leg 11/25/2014  . Acquired lymphedema of leg 11/25/2014  . 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 Pseudoarthrosis of cervical spine ([place]) 01/12/2010  . Hyperthyroidism, subclinical 01/12/2010  . Fast heart beat 01/12/2010    Past Surgical History:  Procedure Laterality Date  . DG RIGHT TIBIA AND FIBULA ([place] HX)    . LEG SURGERY Right     Family History  Family Status  Relation Status  . Mother Alive  . Father Deceased at age 63   cause of death was Alzheimer's Disease  . Sister Alive  . Son Alive        Her family history includes COPD in her mother; Diabetes in her mother; Healthy in her sister and son; Heart disease in her mother.    No Known Allergies  Current Meds  Medication Sig  . aspirin EC 81 MG tablet Take by mouth.  . furosemide (LASIX) 20 MG  tablet Take 1 tablet (20 mg total) by mouth daily.  . hydrochlorothiazide (HYDRODIURIL) 25 MG tablet Take 1 tablet (25 mg total) by mouth daily.  Marland Kitchen losartan (COZAAR) 50 MG tablet Take 1 tablet (50 mg total) by mouth daily.  . metoprolol tartrate (LOPRESSOR) 25 MG tablet Take 1 tablet (25 mg total) by mouth daily.  Marland Kitchen triamcinolone (KENALOG) 0.025 % ointment Apply 1 application topically 2 (two) times daily. As needed for eczema    Patient Care Team: Mar Daring, PA-C as PCP - General (Family Medicine)     Objective:   Vitals: BP 120/70 (BP Location: Left Arm, Patient Position: Sitting, Cuff Size: Large)   Pulse 85   Temp 97.8 F (36.6 C) (Oral)   Resp 16   Ht 5' (1.524 m)   Wt 264 lb (119.7 kg)   SpO2 100%   BMI 51.56 kg/m    Physical Exam  Constitutional: She is oriented to person, place, and time. She appears well-developed and well-nourished. No distress.  HENT:  Head: Normocephalic and atraumatic.  Right Ear: Hearing, tympanic membrane, external ear and ear canal normal.  Left Ear: Hearing, tympanic membrane, external ear and ear canal normal.  Nose: Nose normal.  Mouth/Throat: Uvula is midline, oropharynx is clear and moist and mucous membranes are normal. No oropharyngeal exudate.  Eyes: Conjunctivae and EOM are normal. Pupils are equal, round, and reactive to light. Right eye exhibits no discharge. Left eye exhibits no discharge. No scleral icterus.  Neck: Normal range of motion. Neck supple. No JVD present. Carotid bruit is not present. No tracheal deviation present. No thyromegaly present.  Cardiovascular: Normal rate, regular rhythm, normal heart sounds and intact distal pulses.  Exam reveals no gallop and no friction rub.   No murmur heard. Pulmonary/Chest: Effort normal and breath sounds normal. No respiratory distress. She has no wheezes. She has no rales. She exhibits no tenderness. Right breast exhibits no inverted nipple, no mass, no nipple discharge, no  skin change and no tenderness. Left breast exhibits no inverted nipple, no mass, no nipple discharge, no skin change and no tenderness. Breasts are symmetrical.  Abdominal: Soft. Bowel sounds are normal. She exhibits no distension and no mass. There is no tenderness. There is no rebound and no guarding. Hernia confirmed negative in the right inguinal area and confirmed negative in the left inguinal area.  Genitourinary: Rectum normal, vagina normal and uterus normal. No breast swelling, tenderness, discharge or bleeding. Pelvic exam was performed with patient supine. There is no rash, tenderness, lesion or injury on the right labia. There is no rash, tenderness, lesion or injury on the left labia. Cervix exhibits no motion tenderness, no discharge and no friability. Right adnexum displays no mass, no tenderness and no fullness. Left adnexum displays no mass, no tenderness and no fullness. No erythema, tenderness or bleeding in the vagina. No signs of injury around the vagina. No vaginal discharge found.  Musculoskeletal: Normal range of motion. She  exhibits edema (severe lymphedema noted R>L). She exhibits no tenderness.  Lymphadenopathy:    She has no cervical adenopathy.       Right: No inguinal adenopathy present.       Left: No inguinal adenopathy present.  Neurological: She is alert and oriented to person, place, and time. She has normal reflexes. No cranial nerve deficit. Coordination normal.  Skin: Skin is warm and dry. No rash noted. She is not diaphoretic.     Psychiatric: She has a normal mood and affect. Her behavior is normal. Judgment and thought content normal.  Vitals reviewed.    Depression Screen PHQ 2/9 Scores 09/04/2016  PHQ - 2 Score 0      Assessment & Plan:     Routine Health Maintenance and Physical Exam  Exercise Activities and Dietary recommendations Goals    . Increase physical activity    . Reduce calorie intake to 1500 calories per day    . Reduce salt  intake to 2 grams per day or less       Immunization History  Administered Date(s) Administered  . Influenza,inj,Quad PF,36+ Mos 09/08/2015, 09/04/2016  . Tdap 09/04/2016    Health Maintenance  Topic Date Due  . Hepatitis C Screening  1956/09/15  . HIV Screening  10/08/1970  . ZOSTAVAX  10/09/2015  . COLONOSCOPY  10/08/2016  . MAMMOGRAM  02/09/2017  . PAP SMEAR  09/11/2017  . TETANUS/TDAP  09/04/2026  . INFLUENZA VACCINE  Completed      Discussed health benefits of physical activity, and encouraged her to engage in regular exercise appropriate for her age and condition.    1. Annual physical exam UA normal. Normal physical exam today. Will check labs as below and f/u pending lab results. If labs are stable and WNL she will not need to have these rechecked for one year at her next annual physical exam. She is to call the office in the meantime if she has any acute issue, questions or concerns. - POCT urinalysis dipstick  2. Breast cancer screening Breast exam today was normal. There is no family history of breast cancer. She does perform regular self breast exams. Mammogram was ordered as below. Information for [facility] clinic was given to patient so she may schedule her mammogram at her convenience. - MM Digital Screening; Future  3. Colon cancer screening Referred to Dr. Allen Norris. - Ambulatory referral to Gastroenterology  4. Cervical cancer screening Pap collected today. Will send as below and f/u pending results. - Pap IG and HPV (high risk) DNA detection  5. Encounter for lipid screening for cardiovascular disease Will check labs as below and f/u pending results. - Lipid Panel With LDL/HDL Ratio  6. Family history of diabetes mellitus (DM) Will check labs as below and f/u pending results. - Hemoglobin A1c  7. Hyperthyroidism, subclinical Stable. Will check labs as below and f/u pending results. - TSH  8. Essential (primary) hypertension Stable. Diagnosis  pulled for medication refill. Continue current medical treatment plan. Will check labs as below and f/u pending results. - CBC with Differential/Platelet - Comprehensive metabolic panel - hydrochlorothiazide (HYDRODIURIL) 25 MG tablet; Take 1 tablet (25 mg total) by mouth daily.  Dispense: 90 tablet; Refill: 3 - losartan (COZAAR) 50 MG tablet; Take 1 tablet (50 mg total) by mouth daily.  Dispense: 90 tablet; Refill: 3 - metoprolol tartrate (LOPRESSOR) 25 MG tablet; Take 1 tablet (25 mg total) by mouth daily.  Dispense: 90 tablet; Refill: 3  9. Chronic eczema  Worsening. Will give prednisone as below to try to get better control. Then once completed start triamcinolone back. - predniSONE (DELTASONE) 10 MG tablet; Take 6 tabs PO on day 1&2, 5 tabs PO on day 3&4, 4 tabs PO on day 5&6, 3 tabs PO on day 7&8, 2 tabs PO on day 9&10, 1 tab PO on day 11&12.  Dispense: 42 tablet; Refill: 0  10. Need for immunization against influenza Flu vaccine given today without complication. Patient sat upright for 15 minutes to check for adverse reaction before being released. - Flu Vaccine QUAD 36+ mos IM  11. Need for Tdap vaccination Tdap vaccine given to patient without complications. Patient sat for 15 minutes after administration and was tolerated well without adverse effects. - Tdap vaccine greater than or equal to 7yo IM  12. Need for hepatitis C screening test - Hepatitis C Antibody  13. Acquired lymphedema of leg Stable. Diagnosis pulled for medication refill. Continue current medical treatment plan. - furosemide (LASIX) 20 MG tablet; Take 1 tablet (20 mg total) by mouth daily.  Dispense: 90 tablet; Refill: 3  14. Eczema, unspecified type Stable. Diagnosis pulled for medication refill. Continue current medical treatment plan. - triamcinolone (KENALOG) 0.025 % ointment; Apply 1 application topically 2 (two) times daily. As needed for eczema  Dispense: 454 g; Refill:  3  --------------------------------------------------------------------    Mar Daring, PA-C  [facility]

## 2016-09-06 LAB — LIPID PANEL WITH LDL/HDL RATIO
CHOLESTEROL TOTAL: 172 mg/dL (ref 100–199)
HDL: 70 mg/dL (ref >39)
LDL Calculated: 91 mg/dL (ref 0–99)
LDl/HDL Ratio: 1.3 ratio units (ref 0.0–3.2)
TRIGLYCERIDES: 57 mg/dL (ref 0–149)
VLDL Cholesterol Cal: 11 mg/dL (ref 5–40)

## 2016-09-06 LAB — CBC WITH DIFFERENTIAL/PLATELET
BASOS ABS: 0 10*3/uL (ref 0.0–0.2)
Basos: 0 %
EOS (ABSOLUTE): 0 10*3/uL (ref 0.0–0.4)
Eos: 0 %
HEMOGLOBIN: 12.9 g/dL (ref 11.1–15.9)
Hematocrit: 38.6 % (ref 34.0–46.6)
Immature Grans (Abs): 0 10*3/uL (ref 0.0–0.1)
Immature Granulocytes: 0 %
LYMPHS ABS: 1.1 10*3/uL (ref 0.7–3.1)
LYMPHS: 15 %
MCH: 28.9 pg (ref 26.6–33.0)
MCHC: 33.4 g/dL (ref 31.5–35.7)
MCV: 87 fL (ref 79–97)
MONOCYTES: 4 %
Monocytes Absolute: 0.3 10*3/uL (ref 0.1–0.9)
Neutrophils Absolute: 5.8 10*3/uL (ref 1.4–7.0)
Neutrophils: 81 %
Platelets: 341 10*3/uL (ref 150–379)
RBC: 4.46 x10E6/uL (ref 3.77–5.28)
RDW: 14.7 % (ref 12.3–15.4)
WBC: 7.2 10*3/uL (ref 3.4–10.8)

## 2016-09-06 LAB — COMPREHENSIVE METABOLIC PANEL
ALBUMIN: 4.1 g/dL (ref 3.6–4.8)
ALK PHOS: 98 IU/L (ref 39–117)
ALT: 18 IU/L (ref 0–32)
AST: 11 IU/L (ref 0–40)
Albumin/Globulin Ratio: 1.1 — ABNORMAL LOW (ref 1.2–2.2)
BUN / CREAT RATIO: 18 (ref 12–28)
BUN: 15 mg/dL (ref 8–27)
Bilirubin Total: 0.3 mg/dL (ref 0.0–1.2)
CO2: 27 mmol/L (ref 18–29)
CREATININE: 0.83 mg/dL (ref 0.57–1.00)
Calcium: 9.6 mg/dL (ref 8.7–10.3)
Chloride: 100 mmol/L (ref 96–106)
GFR calc Af Amer: 89 mL/min/{1.73_m2} (ref >59)
GFR calc non Af Amer: 77 mL/min/{1.73_m2} (ref >59)
GLUCOSE: 139 mg/dL — AB (ref 65–99)
Globulin, Total: 3.7 g/dL (ref 1.5–4.5)
Potassium: 4.3 mmol/L (ref 3.5–5.2)
Sodium: 143 mmol/L (ref 134–144)
Total Protein: 7.8 g/dL (ref 6.0–8.5)

## 2016-09-06 LAB — HEMOGLOBIN A1C
ESTIMATED AVERAGE GLUCOSE: 143 mg/dL
HEMOGLOBIN A1C: 6.6 % — AB (ref 4.8–5.6)

## 2016-09-06 LAB — TSH: TSH: 0.303 u[IU]/mL — ABNORMAL LOW (ref 0.450–4.500)

## 2016-09-06 LAB — HEPATITIS C ANTIBODY: Hep C Virus Ab: 0.2 s/co ratio (ref 0.0–0.9)

## 2016-09-09 ENCOUNTER — Telehealth: Payer: Self-pay

## 2016-09-09 LAB — PAP IG AND HPV HIGH-RISK
HPV, HIGH-RISK: NEGATIVE
PAP SMEAR COMMENT: 0

## 2016-09-09 NOTE — Telephone Encounter (Signed)
-----   Message from Mar Daring, [place] sent at 09/09/2016 12:43 PM EDT ----- HgBA1c is increased to 6.6 which is diabetic level. TSH is slightly off as well. All other labs stable. Work on lifestyle modifications for diabetes including limiting sugars, carbohydrates and fatty foods. Increase physical activity to try to get in 150 minutes of moderate activity per week. We will recheck these 2 labs in 3 months. If A1c still elevated we will start medication, and if TSH still low we will get [place] of thyroid.

## 2016-09-09 NOTE — Telephone Encounter (Signed)
Patient advised as directed below.  Thanks,  -Joseline 

## 2016-09-10 ENCOUNTER — Telehealth: Payer: Self-pay

## 2016-09-10 NOTE — Telephone Encounter (Signed)
Patient advised as below.  

## 2016-09-10 NOTE — Telephone Encounter (Signed)
-----   Message from Mar Daring, PA-C sent at 09/10/2016  8:21 AM EDT ----- Pap is normal, HPV negative.  Will repeat in 3-5 years.

## 2016-09-12 ENCOUNTER — Telehealth: Payer: Self-pay | Admitting: Gastroenterology

## 2016-09-12 ENCOUNTER — Telehealth: Payer: Self-pay

## 2016-09-12 ENCOUNTER — Other Ambulatory Visit: Payer: Self-pay

## 2016-09-12 DIAGNOSIS — Z1211 Encounter for screening for malignant neoplasm of colon: Secondary | ICD-10-CM

## 2016-09-12 MED ORDER — NA SULFATE-K SULFATE-MG SULF 17.5-3.13-1.6 GM/177ML PO SOLN: 1.0000 | ORAL | 0 refills | Status: DC

## 2016-09-12 NOTE — Telephone Encounter (Signed)
Patient needs to reschedule her colonoscopy. 12/4 and would like to move it to 12/18. Please call

## 2016-09-12 NOTE — Telephone Encounter (Signed)
Gastroenterology Pre-Procedure Review  Request Date: 10/14/16 Requesting Physician: Dr. Vicente Males  PATIENT REVIEW QUESTIONS: The patient responded to the following health history questions as indicated:    1. Are you having any GI issues? no 2. Do you have a personal history of Polyps? no 3. Do you have a family history of Colon Cancer or Polyps? no 4. Diabetes Mellitus? no 5. Joint replacements in the past 12 months?no 6. Major health problems in the past 3 months?no 7. Any artificial heart valves, MVP, or defibrillator?no    MEDICATIONS & ALLERGIES:    Patient reports the following regarding taking any anticoagulation/antiplatelet therapy:   Plavix, Coumadin, Eliquis, Xarelto, Lovenox, Pradaxa, Brilinta, or Effient? no Aspirin? yes (81mg  Daily)  Patient confirms/reports the following medications:  Current Outpatient Prescriptions  Medication Sig Dispense Refill  . aspirin EC 81 MG tablet Take by mouth.    . furosemide (LASIX) 20 MG tablet Take 1 tablet (20 mg total) by mouth daily. 90 tablet 3  . hydrochlorothiazide (HYDRODIURIL) 25 MG tablet Take 1 tablet (25 mg total) by mouth daily. 90 tablet 3  . losartan (COZAAR) 50 MG tablet Take 1 tablet (50 mg total) by mouth daily. 90 tablet 3  . metoprolol tartrate (LOPRESSOR) 25 MG tablet Take 1 tablet (25 mg total) by mouth daily. 90 tablet 3  . predniSONE (DELTASONE) 10 MG tablet Take 6 tabs PO on day 1&2, 5 tabs PO on day 3&4, 4 tabs PO on day 5&6, 3 tabs PO on day 7&8, 2 tabs PO on day 9&10, 1 tab PO on day 11&12. 42 tablet 0  . triamcinolone (KENALOG) 0.025 % ointment Apply 1 application topically 2 (two) times daily. As needed for eczema 454 g 3   No current facility-administered medications for this visit.     Patient confirms/reports the following allergies:  No Known Allergies  No orders of the defined types were placed in this encounter.   AUTHORIZATION INFORMATION Primary Insurance: 1D#: Group #:  Secondary  Insurance: 1D#: Group #:  SCHEDULE INFORMATION: Date: 10/14/16 Time: Location: [facility]- Dr. Vicente Males

## 2016-09-12 NOTE — Telephone Encounter (Signed)
rescheduled

## 2016-09-12 NOTE — Telephone Encounter (Signed)
Insurance in chart verified.  Orders placed.  Paperwork mailed.  Suprep sent to pharmacy.

## 2016-09-13 ENCOUNTER — Encounter: Payer: Self-pay | Admitting: Physician Assistant

## 2016-10-28 ENCOUNTER — Encounter: Payer: Self-pay | Admitting: *Deleted

## 2016-10-28 ENCOUNTER — Ambulatory Visit
Admission: RE | Admit: 2016-10-28 | Discharge: 2016-10-28 | Disposition: A | Discharge status: Home / Self Care | Payer: BC Managed Care – PPO | Source: Ambulatory Visit | Attending: Gastroenterology | Admitting: Gastroenterology

## 2016-10-28 ENCOUNTER — Encounter
Admission: RE | Disposition: A | Discharge status: Home / Self Care | Payer: Self-pay | Source: Ambulatory Visit | Attending: Gastroenterology

## 2016-10-28 ENCOUNTER — Ambulatory Visit: Payer: BC Managed Care – PPO | Admitting: Anesthesiology

## 2016-10-28 DIAGNOSIS — Z86010 Personal history of colonic polyps: Secondary | ICD-10-CM | POA: Diagnosis not present

## 2016-10-28 DIAGNOSIS — Z7982 Long term (current) use of aspirin: Secondary | ICD-10-CM | POA: Diagnosis not present

## 2016-10-28 DIAGNOSIS — Z79899 Other long term (current) drug therapy: Secondary | ICD-10-CM | POA: Diagnosis not present

## 2016-10-28 DIAGNOSIS — E0590 Thyrotoxicosis, unspecified without thyrotoxic crisis or storm: Secondary | ICD-10-CM | POA: Diagnosis not present

## 2016-10-28 DIAGNOSIS — Z1211 Encounter for screening for malignant neoplasm of colon: Secondary | ICD-10-CM | POA: Diagnosis not present

## 2016-10-28 DIAGNOSIS — I10 Essential (primary) hypertension: Secondary | ICD-10-CM | POA: Diagnosis not present

## 2016-10-28 DIAGNOSIS — M1990 Unspecified osteoarthritis, unspecified site: Secondary | ICD-10-CM | POA: Insufficient documentation

## 2016-10-28 DIAGNOSIS — K635 Polyp of colon: Secondary | ICD-10-CM

## 2016-10-28 DIAGNOSIS — D125 Benign neoplasm of sigmoid colon: Secondary | ICD-10-CM | POA: Diagnosis not present

## 2016-10-28 DIAGNOSIS — E669 Obesity, unspecified: Secondary | ICD-10-CM | POA: Diagnosis not present

## 2016-10-28 DIAGNOSIS — Z6834 Body mass index (BMI) 34.0-34.9, adult: Secondary | ICD-10-CM | POA: Diagnosis not present

## 2016-10-28 HISTORY — DX: Essential (primary) hypertension: I10

## 2016-10-28 HISTORY — PX: COLONOSCOPY WITH PROPOFOL: SHX5780

## 2016-10-28 SURGERY — COLONOSCOPY WITH PROPOFOL
Anesthesia: General

## 2016-10-28 MED ORDER — FENTANYL CITRATE (PF) 100 MCG/2ML IJ SOLN
INTRAMUSCULAR | Status: DC | PRN
Start: 2016-10-28 — End: 2016-10-28
  Administered 2016-10-28: 50 ug via INTRAVENOUS

## 2016-10-28 MED ORDER — PHENYLEPHRINE HCL 10 MG/ML IJ SOLN
INTRAMUSCULAR | Status: DC | PRN
  Administered 2016-10-28: 100 ug via INTRAVENOUS

## 2016-10-28 MED ORDER — SODIUM CHLORIDE 0.9 % IV SOLN
INTRAVENOUS | Status: DC
  Administered 2016-10-28: 1000 mL via INTRAVENOUS

## 2016-10-28 MED ORDER — PROPOFOL 10 MG/ML IV BOLUS
INTRAVENOUS | Status: DC | PRN
  Administered 2016-10-28: 50 mg via INTRAVENOUS

## 2016-10-28 MED ORDER — LIDOCAINE HCL (CARDIAC) 20 MG/ML IV SOLN
INTRAVENOUS | Status: DC | PRN
  Administered 2016-10-28: 40 mg via INTRAVENOUS

## 2016-10-28 MED ORDER — PROPOFOL 500 MG/50ML IV EMUL
INTRAVENOUS | Status: DC | PRN
  Administered 2016-10-28: 150 ug/kg/min via INTRAVENOUS

## 2016-10-28 MED ORDER — MIDAZOLAM HCL 2 MG/2ML IJ SOLN
INTRAMUSCULAR | Status: DC | PRN
  Administered 2016-10-28: 1 mg via INTRAVENOUS

## 2016-10-28 NOTE — OR Nursing (Signed)
One polyp removed from sigmoid colon but wasn't retrieved. Polyp was cold snared.

## 2016-10-28 NOTE — Transfer of Care (Signed)
Immediate Anesthesia Transfer of Care Note  Patient: Autumn Burton  Procedure(s) Performed: Procedure(s): COLONOSCOPY WITH PROPOFOL (N/A)  Patient Location: PACU and Endoscopy Unit  Anesthesia Type:General  Level of Consciousness: sedated  Airway & Oxygen Therapy: Patient Spontanous Breathing and Patient connected to nasal cannula oxygen  Post-op Assessment: Report given to RN and Post -op Vital signs reviewed and stable  Post vital signs: Reviewed and stable  Last Vitals:  Vitals:   10/28/16 0650 10/28/16 0759  BP: (!) 146/82 (!) 97/59  Pulse: 95 78  Resp: 16 (!) 22  Temp: (!) 36.1 C (!) [id number]    Complications: No apparent anesthesia complications

## 2016-10-28 NOTE — Op Note (Signed)
[facility] Gastroenterology Patient Name: Autumn Burton Procedure Date: 10/28/2016 7:31 AM MRN: [id number] Account #: [id number] Date of Birth: [birth year] Admit Type: Outpatient Age: 60 Room: [facility] ENDO ROOM 3 Gender: Female Note Status: Finalized Procedure:            Colonoscopy Indications:          Surveillance: Personal history of colonic polyps                        (unknown histology) on last colonoscopy 5 years ago Providers:            Jonathon Bellows MD, MD Referring MD:         Mar Daring (Referring MD) Medicines:            Monitored Anesthesia Care Complications:        No immediate complications. Procedure:            Pre-Anesthesia Assessment:                       - Prior to the procedure, a History and Physical was                        performed, and patient medications, allergies and                        sensitivities were reviewed. The patient's tolerance of                        previous anesthesia was reviewed.                       - The risks and benefits of the procedure and the                        sedation options and risks were discussed with the                        patient. All questions were answered and informed                        consent was obtained.                       - The risks and benefits of the procedure and the                        sedation options and risks were discussed with the                        patient. All questions were answered and informed                        consent was obtained.                       - ASA Grade Assessment: II - A patient with mild                        systemic disease.  After obtaining informed consent, the colonoscope was                        passed under direct vision. Throughout the procedure,                        the patient's blood pressure, pulse, and oxygen                        saturations were monitored continuously. The                       Colonoscope was introduced through the anus and                        advanced to the the cecum, identified by the                        appendiceal orifice, IC valve and transillumination.                        The colonoscopy was performed with ease. The patient                        tolerated the procedure well. The quality of the bowel                        preparation was good. Findings:      The perianal and digital rectal examinations were normal.      A 5 mm, non-bleeding polyp was found in the sigmoid colon. The polyp was       sessile. The polyp was removed with a cold snare. Resection and       retrieval were complete. No biopsies or other specimens were collected       for this exam. Impression:           - One 5 mm, non-bleeding polyp in the sigmoid colon,                        removed with a cold snare. Resected and retrieved. No                        specimens collected. Recommendation:       - Repeat colonoscopy in 5 years for surveillance.                       - Patient has a contact number available for                        emergencies. The signs and symptoms of potential                        delayed complications were discussed with the patient.                        Return to normal activities tomorrow. Written discharge                        instructions were provided to the patient.                       -  Resume previous diet.                       - Continue present medications.                       - Await pathology results. Procedure Code(s):    --- Professional ---                       [phone], Colonoscopy, flexible; with removal of tumor(s),                        polyp(s), or other lesion(s) by snare technique Diagnosis Code(s):    --- Professional ---                       Z86.010, Personal history of colonic polyps                       D12.5, Benign neoplasm of sigmoid colon CPT copyright 2016 American Medical  Association. All rights reserved. The codes documented in this report are preliminary and upon coder review may  be revised to meet current compliance requirements. Jonathon Bellows, MD Jonathon Bellows MD, MD 10/28/2016 7:56:13 AM This report has been signed electronically. Number of Addenda: 0 Note Initiated On: 10/28/2016 7:31 AM Scope Withdrawal Time: 0 hours 13 minutes 7 seconds  Total Procedure Duration: 0 hours 16 minutes 49 seconds       [facility]

## 2016-10-28 NOTE — H&P (Signed)
Jonathon Bellows MD [address], [place], [place] [address] Phone: [phone] Fax : [phone]  Primary Care Physician:  Mar Daring, PA-C Primary Gastroenterologist:  Dr. Jonathon Bellows   Pre-Procedure History & Physical: HPI:  Autumn Burton is a 60 y.o. female is here for an colonoscopy.   Past Medical History:  Diagnosis Date  . Hypertension     Past Surgical History:  Procedure Laterality Date  . DG RIGHT TIBIA AND FIBULA ([place] HX)    . LEG SURGERY Right     Prior to Admission medications   Medication Sig Start Date End Date Taking? Authorizing Provider  aspirin EC 81 MG tablet Take by mouth.   Yes Historical Provider, MD  furosemide (LASIX) 20 MG tablet Take 1 tablet (20 mg total) by mouth daily. 09/04/16  Yes Clearnce Sorrel Burnette, PA-C  hydrochlorothiazide (HYDRODIURIL) 25 MG tablet Take 1 tablet (25 mg total) by mouth daily. 09/04/16  Yes Clearnce Sorrel Burnette, PA-C  losartan (COZAAR) 50 MG tablet Take 1 tablet (50 mg total) by mouth daily. 09/04/16  Yes Clearnce Sorrel Burnette, PA-C  metoprolol tartrate (LOPRESSOR) 25 MG tablet Take 1 tablet (25 mg total) by mouth daily. 09/04/16  Yes Jennifer M Burnette, PA-C  Na Sulfate-K Sulfate-Mg Sulf (SUPREP BOWEL PREP KIT) 17.5-3.13-1.6 GM/180ML SOLN Take 1 kit by mouth as directed. 09/12/16  Yes Jonathon Bellows, MD  predniSONE (DELTASONE) 10 MG tablet Take 6 tabs PO on day 1&2, 5 tabs PO on day 3&4, 4 tabs PO on day 5&6, 3 tabs PO on day 7&8, 2 tabs PO on day 9&10, 1 tab PO on day 11&12. 09/04/16  Yes Clearnce Sorrel Burnette, PA-C  triamcinolone (KENALOG) 0.025 % ointment Apply 1 application topically 2 (two) times daily. As needed for eczema 09/04/16  Yes Mar Daring, PA-C    Allergies as of 09/12/2016  . (No Known Allergies)    Family History  Problem Relation Age of Onset  . Diabetes Mother   . COPD Mother   . Heart disease Mother   . Healthy Sister   . Healthy Son     Social History   Social History  .  Marital status: Married    Spouse name: N/A  . Number of children: 1  . Years of education: N/A   Occupational History  . Not on file.   Social History Main Topics  . Smoking status: Never Smoker  . Smokeless tobacco: Never Used  . Alcohol use No  . Drug use: No  . Sexual activity: Not on file   Other Topics Concern  . Not on file   Social History Narrative  . No narrative on file    Review of Systems: See HPI, otherwise negative ROS  Physical Exam: BP (!) 146/82   Pulse 95   Temp (!) 96.9 F (36.1 C) (Tympanic)   Resp 16   Ht '5\' 5"'$  (1.651 m)   Wt 207 lb (93.9 kg)   SpO2 100%   BMI 34.45 kg/m  General:   Alert,  pleasant and cooperative in NAD Head:  Normocephalic and atraumatic. Neck:  Supple; no masses or thyromegaly. Lungs:  Clear throughout to auscultation.    Heart:  Regular rate and rhythm. Abdomen:  Soft, nontender and nondistended. Normal bowel sounds, without guarding, and without rebound.   Neurologic:  Alert and  oriented x4;  grossly normal neurologically.  Impression/Plan: Autumn Burton is here for an colonoscopy to be performed for colorectal cancer screening  Risks, benefits, limitations, and alternatives regarding  colonoscopy have been reviewed with the patient.  Questions have been answered.  All parties agreeable.   Jonathon Bellows, MD  10/28/2016, 7:17 AM

## 2016-10-28 NOTE — Anesthesia Preprocedure Evaluation (Signed)
Anesthesia Evaluation  Patient identified by MRN, date of birth, ID band Patient awake    Reviewed: Allergy & Precautions, NPO status , Patient's Chart, lab work & pertinent test results  Airway Mallampati: II       Dental  (+) Upper Dentures, Lower Dentures   Pulmonary neg pulmonary ROS,    breath sounds clear to auscultation       Cardiovascular Exercise Tolerance: Good hypertension, Pt. on home beta blockers and Pt. on medications  Rhythm:Regular Rate:Normal     Neuro/Psych negative neurological ROS     GI/Hepatic negative GI ROS, Neg liver ROS,   Endo/Other  Hyperthyroidism Morbid obesity  Renal/GU negative Renal ROS     Musculoskeletal  (+) Arthritis ,   Abdominal (+) + obese,   Peds  Hematology   Anesthesia Other Findings   Reproductive/Obstetrics                             Anesthesia Physical Anesthesia Plan  ASA: II  Anesthesia Plan: General   Post-op Pain Management:    Induction: Intravenous  Airway Management Planned: Natural Airway and Nasal Cannula  Additional Equipment:   Intra-op Plan:   Post-operative Plan:   Informed Consent: I have reviewed the patients History and Physical, chart, labs and discussed the procedure including the risks, benefits and alternatives for the proposed anesthesia with the patient or authorized representative who has indicated his/her understanding and acceptance.     Plan Discussed with: CRNA  Anesthesia Plan Comments:         Anesthesia Quick Evaluation

## 2016-10-28 NOTE — Anesthesia Procedure Notes (Signed)
Date/Time: 10/28/2016 7:31 AM Performed by: Doreen Salvage Pre-anesthesia Checklist: Patient identified, Emergency Drugs available, Suction available and Patient being monitored Patient Re-evaluated:Patient Re-evaluated prior to inductionOxygen Delivery Method: Nasal cannula Intubation Type: IV induction Dental Injury: Teeth and Oropharynx as per pre-operative assessment  Comments: Nasal cannula with etCO2 monitoring

## 2016-10-28 NOTE — Anesthesia Postprocedure Evaluation (Signed)
Anesthesia Post Note  Patient: Autumn Burton  Procedure(s) Performed: Procedure(s) (LRB): COLONOSCOPY WITH PROPOFOL (N/A)  Anesthesia Type: General Level of consciousness: awake Pain management: pain level controlled Vital Signs Assessment: post-procedure vital signs reviewed and stable Respiratory status: spontaneous breathing Cardiovascular status: stable Anesthetic complications: no    Last Vitals:  Vitals:   10/28/16 0800 10/28/16 0810  BP: 104/75 104/75  Pulse: 77 79  Resp: 18 (!) 22  Temp:      Last Pain:  Vitals:   10/28/16 0759  TempSrc: Tympanic                 VAN STAVEREN,Kyung Muto

## 2016-10-29 ENCOUNTER — Encounter: Payer: Self-pay | Admitting: Gastroenterology

## 2017-02-13 ENCOUNTER — Encounter: Payer: Self-pay | Admitting: Family Medicine

## 2017-02-13 ENCOUNTER — Ambulatory Visit (INDEPENDENT_AMBULATORY_CARE_PROVIDER_SITE_OTHER): Payer: BC Managed Care – PPO | Admitting: Family Medicine

## 2017-02-13 VITALS — BP 120/58 | HR 93 | Temp 97.6°F | Resp 18 | Wt 265.8 lb

## 2017-02-13 DIAGNOSIS — B349 Viral infection, unspecified: Secondary | ICD-10-CM

## 2017-02-13 LAB — POC INFLUENZA A&B (BINAX/QUICKVUE)
INFLUENZA A, POC: NEGATIVE
Influenza B, POC: NEGATIVE

## 2017-02-13 MED ORDER — AMOXICILLIN-POT CLAVULANATE 875-125 MG PO TABS: 1.0000 | ORAL_TABLET | Freq: Two times a day (BID) | ORAL | 0 refills | Status: DC

## 2017-02-13 MED ORDER — HYDROCODONE-HOMATROPINE 5-1.5 MG/5ML PO SYRP: ORAL_SOLUTION | ORAL | 0 refills | Status: DC

## 2017-02-13 NOTE — Patient Instructions (Signed)
Try Mucinex D. If sinuses not clearing over the next day or two start the antibiotic.

## 2017-02-13 NOTE — Progress Notes (Signed)
Subjective:     Patient ID: Autumn Burton, female   DOB: 05/05/1956, 61 y.o.   MRN: [id number]  HPI  Chief Complaint  Patient presents with  . Cough    Patient comes in office today with complaints of cough and congestion since 02/07/17. Associated with cough patient reports the following; nausea/vomiting, feverx3 days, chest tightness, shortness of breath, wheezing, diarrhea and incontience due to cough. Patient has been taking otc Benadryl, cough syrup and Robitussin. Patient reports that she has not had fever in the past two days and she did recieve her flu shot this past year.   Reports cough is minimally productive. Sinus pressure remains with purulent drainage.   Review of Systems     Objective:   Physical Exam  Constitutional: She appears well-developed and well-nourished. No distress.  Ears: T.M's intact without inflammation Sinuses: purulent drainage apparent on tissue Throat: no tonsillar enlargement or exudate Neck: no cervical adenopathy Lungs: clear     Assessment:    1. Acute viral syndrome - POC Influenza A&B(BINAX/QUICKVUE) - HYDROcodone-homatropine (HYCODAN) 5-1.5 MG/5ML syrup; 5 ml 4-6 hours as needed for cough  Dispense: 240 mL; Refill: 0 - amoxicillin-clavulanate (AUGMENTIN) 875-125 MG tablet; Take 1 tablet by mouth 2 (two) times daily.  Dispense: 20 tablet; Refill: 0    Plan:    Try Mucinex D. Start abx if sinuses not improving over the next day or two.

## 2017-09-09 ENCOUNTER — Other Ambulatory Visit: Payer: Self-pay | Admitting: Physician Assistant

## 2017-09-09 DIAGNOSIS — I10 Essential (primary) hypertension: Secondary | ICD-10-CM

## 2017-09-29 ENCOUNTER — Encounter: Payer: BC Managed Care – PPO | Admitting: Physician Assistant

## 2017-10-09 ENCOUNTER — Encounter: Payer: Self-pay | Admitting: Physician Assistant

## 2017-10-09 ENCOUNTER — Other Ambulatory Visit: Payer: Self-pay

## 2017-10-09 ENCOUNTER — Ambulatory Visit (INDEPENDENT_AMBULATORY_CARE_PROVIDER_SITE_OTHER): Payer: BC Managed Care – PPO | Admitting: Physician Assistant

## 2017-10-09 VITALS — BP 120/66 | HR 80 | Temp 97.7°F | Resp 16 | Ht 65.0 in | Wt 263.0 lb

## 2017-10-09 DIAGNOSIS — E0590 Thyrotoxicosis, unspecified without thyrotoxic crisis or storm: Secondary | ICD-10-CM

## 2017-10-09 DIAGNOSIS — I890 Lymphedema, not elsewhere classified: Secondary | ICD-10-CM

## 2017-10-09 DIAGNOSIS — Z114 Encounter for screening for human immunodeficiency virus [HIV]: Secondary | ICD-10-CM

## 2017-10-09 DIAGNOSIS — Z0000 Encounter for general adult medical examination without abnormal findings: Secondary | ICD-10-CM | POA: Diagnosis not present

## 2017-10-09 DIAGNOSIS — I10 Essential (primary) hypertension: Secondary | ICD-10-CM

## 2017-10-09 DIAGNOSIS — L309 Dermatitis, unspecified: Secondary | ICD-10-CM | POA: Diagnosis not present

## 2017-10-09 DIAGNOSIS — Z6841 Body Mass Index (BMI) 40.0 and over, adult: Secondary | ICD-10-CM | POA: Diagnosis not present

## 2017-10-09 DIAGNOSIS — Z1231 Encounter for screening mammogram for malignant neoplasm of breast: Secondary | ICD-10-CM | POA: Diagnosis not present

## 2017-10-09 DIAGNOSIS — Z1239 Encounter for other screening for malignant neoplasm of breast: Secondary | ICD-10-CM

## 2017-10-09 DIAGNOSIS — Z23 Encounter for immunization: Secondary | ICD-10-CM

## 2017-10-09 MED ORDER — PREDNISONE 10 MG PO TABS: ORAL_TABLET | ORAL | 0 refills | Status: DC

## 2017-10-09 NOTE — Patient Instructions (Signed)
Health Maintenance for Postmenopausal Women Menopause is a normal process in which your reproductive ability comes to an end. This process happens gradually over a span of months to years, usually between the ages of 22 and 9. Menopause is complete when you have missed 12 consecutive menstrual periods. It is important to talk with your health care provider about some of the most common conditions that affect postmenopausal women, such as heart disease, cancer, and bone loss (osteoporosis). Adopting a healthy lifestyle and getting preventive care can help to promote your health and wellness. Those actions can also lower your chances of developing some of these common conditions. What should I know about menopause? During menopause, you may experience a number of symptoms, such as:  Moderate-to-severe hot flashes.  Night sweats.  Decrease in sex drive.  Mood swings.  Headaches.  Tiredness.  Irritability.  Memory problems.  Insomnia.  Choosing to treat or not to treat menopausal changes is an individual decision that you make with your health care provider. What should I know about hormone replacement therapy and supplements? Hormone therapy products are effective for treating symptoms that are associated with menopause, such as hot flashes and night sweats. Hormone replacement carries certain risks, especially as you become older. If you are thinking about using estrogen or estrogen with progestin treatments, discuss the benefits and risks with your health care provider. What should I know about heart disease and stroke? Heart disease, heart attack, and stroke become more likely as you age. This may be due, in part, to the hormonal changes that your body experiences during menopause. These can affect how your body processes dietary fats, triglycerides, and cholesterol. Heart attack and stroke are both medical emergencies. There are many things that you can do to help prevent heart disease  and stroke:  Have your blood pressure checked at least every 1-2 years. High blood pressure causes heart disease and increases the risk of stroke.  If you are 53-22 years old, ask your health care provider if you should take aspirin to prevent a heart attack or a stroke.  Do not use any tobacco products, including cigarettes, chewing tobacco, or electronic cigarettes. If you need help quitting, ask your health care provider.  It is important to eat a healthy diet and maintain a healthy weight. ? Be sure to include plenty of vegetables, fruits, low-fat dairy products, and lean protein. ? Avoid eating foods that are high in solid fats, added sugars, or salt (sodium).  Get regular exercise. This is one of the most important things that you can do for your health. ? Try to exercise for at least 150 minutes each week. The type of exercise that you do should increase your heart rate and make you sweat. This is known as moderate-intensity exercise. ? Try to do strengthening exercises at least twice each week. Do these in addition to the moderate-intensity exercise.  Know your numbers.Ask your health care provider to check your cholesterol and your blood glucose. Continue to have your blood tested as directed by your health care provider.  What should I know about cancer screening? There are several types of cancer. Take the following steps to reduce your risk and to catch any cancer development as early as possible. Breast Cancer  Practice breast self-awareness. ? This means understanding how your breasts normally appear and feel. ? It also means doing regular breast self-exams. Let your health care provider know about any changes, no matter how small.  If you are 40  or older, have a clinician do a breast exam (clinical breast exam or CBE) every year. Depending on your age, family history, and medical history, it may be recommended that you also have a yearly breast X-ray (mammogram).  If you  have a family history of breast cancer, talk with your health care provider about genetic screening.  If you are at high risk for breast cancer, talk with your health care provider about having an MRI and a mammogram every year.  Breast cancer (BRCA) gene test is recommended for women who have family members with BRCA-related cancers. Results of the assessment will determine the need for genetic counseling and BRCA1 and for BRCA2 testing. BRCA-related cancers include these types: ? Breast. This occurs in males or females. ? Ovarian. ? Tubal. This may also be called fallopian tube cancer. ? Cancer of the abdominal or pelvic lining (peritoneal cancer). ? Prostate. ? Pancreatic.  Cervical, Uterine, and Ovarian Cancer Your health care provider may recommend that you be screened regularly for cancer of the pelvic organs. These include your ovaries, uterus, and vagina. This screening involves a pelvic exam, which includes checking for microscopic changes to the surface of your cervix (Pap test).  For women ages 21-65, health care providers may recommend a pelvic exam and a Pap test every three years. For women ages 79-65, they may recommend the Pap test and pelvic exam, combined with testing for human papilloma virus (HPV), every five years. Some types of HPV increase your risk of cervical cancer. Testing for HPV may also be done on women of any age who have unclear Pap test results.  Other health care providers may not recommend any screening for nonpregnant women who are considered low risk for pelvic cancer and have no symptoms. Ask your health care provider if a screening pelvic exam is right for you.  If you have had past treatment for cervical cancer or a condition that could lead to cancer, you need Pap tests and screening for cancer for at least 20 years after your treatment. If Pap tests have been discontinued for you, your risk factors (such as having a new sexual partner) need to be  reassessed to determine if you should start having screenings again. Some women have medical problems that increase the chance of getting cervical cancer. In these cases, your health care provider may recommend that you have screening and Pap tests more often.  If you have a family history of uterine cancer or ovarian cancer, talk with your health care provider about genetic screening.  If you have vaginal bleeding after reaching menopause, tell your health care provider.  There are currently no reliable tests available to screen for ovarian cancer.  Lung Cancer Lung cancer screening is recommended for adults 69-62 years old who are at high risk for lung cancer because of a history of smoking. A yearly low-dose CT scan of the lungs is recommended if you:  Currently smoke.  Have a history of at least 30 pack-years of smoking and you currently smoke or have quit within the past 15 years. A pack-year is smoking an average of one pack of cigarettes per day for one year.  Yearly screening should:  Continue until it has been 15 years since you quit.  Stop if you develop a health problem that would prevent you from having lung cancer treatment.  Colorectal Cancer  This type of cancer can be detected and can often be prevented.  Routine colorectal cancer screening usually begins at  age 42 and continues through age 45.  If you have risk factors for colon cancer, your health care provider may recommend that you be screened at an earlier age.  If you have a family history of colorectal cancer, talk with your health care provider about genetic screening.  Your health care provider may also recommend using home test kits to check for hidden blood in your stool.  A small camera at the end of a tube can be used to examine your colon directly (sigmoidoscopy or colonoscopy). This is done to check for the earliest forms of colorectal cancer.  Direct examination of the colon should be repeated every  5-10 years until age 71. However, if early forms of precancerous polyps or small growths are found or if you have a family history or genetic risk for colorectal cancer, you may need to be screened more often.  Skin Cancer  Check your skin from head to toe regularly.  Monitor any moles. Be sure to tell your health care provider: ? About any new moles or changes in moles, especially if there is a change in a mole's shape or color. ? If you have a mole that is larger than the size of a pencil eraser.  If any of your family members has a history of skin cancer, especially at a young age, talk with your health care provider about genetic screening.  Always use sunscreen. Apply sunscreen liberally and repeatedly throughout the day.  Whenever you are outside, protect yourself by wearing long sleeves, pants, a wide-brimmed hat, and sunglasses.  What should I know about osteoporosis? Osteoporosis is a condition in which bone destruction happens more quickly than new bone creation. After menopause, you may be at an increased risk for osteoporosis. To help prevent osteoporosis or the bone fractures that can happen because of osteoporosis, the following is recommended:  If you are 46-71 years old, get at least 1,000 mg of calcium and at least 600 mg of vitamin D per day.  If you are older than age 55 but younger than age 65, get at least 1,200 mg of calcium and at least 600 mg of vitamin D per day.  If you are older than age 54, get at least 1,200 mg of calcium and at least 800 mg of vitamin D per day.  Smoking and excessive alcohol intake increase the risk of osteoporosis. Eat foods that are rich in calcium and vitamin D, and do weight-bearing exercises several times each week as directed by your health care provider. What should I know about how menopause affects my mental health? Depression may occur at any age, but it is more common as you become older. Common symptoms of depression  include:  Low or sad mood.  Changes in sleep patterns.  Changes in appetite or eating patterns.  Feeling an overall lack of motivation or enjoyment of activities that you previously enjoyed.  Frequent crying spells.  Talk with your health care provider if you think that you are experiencing depression. What should I know about immunizations? It is important that you get and maintain your immunizations. These include:  Tetanus, diphtheria, and pertussis (Tdap) booster vaccine.  Influenza every year before the flu season begins.  Pneumonia vaccine.  Shingles vaccine.  Your health care provider may also recommend other immunizations. This information is not intended to replace advice given to you by your health care provider. Make sure you discuss any questions you have with your health care provider. Document Released: 12/20/2005  Document Revised: 05/17/2016 Document Reviewed: 08/01/2015 [employer] Interactive Patient Education  2018 [employer]  

## 2017-10-09 NOTE — Progress Notes (Signed)
Patient: Autumn Burton, Female    DOB: June 12, 1956, 61 y.o.   MRN: [id number] Visit Date: 10/09/2017  Today's Provider: Mar Daring, PA-C   Chief Complaint  Patient presents with  . Annual Exam   Subjective:    Annual physical exam Autumn Burton is a 61 y.o. female who presents today for health maintenance and complete physical. She feels well. She reports exercising none. She reports she is sleeping well. 09/04/16 CPE 09/04/16 Pap-neg; HPV-neg 09/12/16 Mammogram-BI-RADS 2 10/28/16 Colonoscopy-polyps ----------------------------------------------------------------- Patient is requesting referral to dermatology, patient reports that she has been dealing with chronic eczema for several years and it has progressively gotten worse. It is now affecting her scalp and she is starting to lose hair in patches. Patient reports using Eucerin. She also has triamcinolone cream she uses once daily.   Review of Systems  Constitutional: Negative.   HENT: Negative.   Eyes: Negative.   Respiratory: Negative.   Cardiovascular: Negative.   Gastrointestinal: Negative.   Endocrine: Negative.   Genitourinary: Negative.   Musculoskeletal: Negative.   Skin: Positive for rash.  Allergic/Immunologic: Negative.   Neurological: Negative.   Hematological: Negative.   Psychiatric/Behavioral: Negative.     Social History      She  reports that  has never smoked. she has never used smokeless tobacco. She reports that she does not drink alcohol or use drugs.       Social History   Socioeconomic History  . Marital status: Married    Spouse name: None  . Number of children: 1  . Years of education: None  . Highest education level: None  Social Needs  . Financial resource strain: None  . Food insecurity - worry: None  . Food insecurity - inability: None  . Transportation needs - medical: None  . Transportation needs - non-medical: None  Occupational History  . None    Tobacco Use  . Smoking status: Never Smoker  . Smokeless tobacco: Never Used  Substance and Sexual Activity  . Alcohol use: No    Alcohol/week: 0.0 oz  . Drug use: No  . Sexual activity: None  Other Topics Concern  . None  Social History Narrative  . None    Past Medical History:  Diagnosis Date  . Hypertension      Patient Active Problem List   Diagnosis Date Noted  . Hx of colonic polyps   . Polyp of sigmoid colon   . Morbid obesity with BMI of 45.0-49.9, adult ([place]) 03/06/2016  . Chronic eczema 01/17/2015  . Adverse effect of angiotensin-converting enzyme inhibitor 01/17/2015  . Essential (primary) hypertension 01/17/2015  . Secondary osteoarthritis of ankle 01/17/2015  . Abscess or cellulitis of leg 11/25/2014  . Acquired lymphedema of leg 11/25/2014  . 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 Pseudoarthrosis of cervical spine ([place]) 01/12/2010  . Hyperthyroidism, subclinical 01/12/2010  . Fast heart beat 01/12/2010    Past Surgical History:  Procedure Laterality Date  . COLONOSCOPY WITH PROPOFOL N/A 10/28/2016   Procedure: COLONOSCOPY WITH PROPOFOL;  Surgeon: Jonathon Bellows, MD;  Location: [facility] ENDOSCOPY;  Service: Endoscopy;  Laterality: N/A;  . DG RIGHT TIBIA AND FIBULA ([place] HX)    . LEG SURGERY Right     Family History        Family Status  Relation Name Status  . Mother  Alive  . Father  Deceased at age 45  cause of death was Alzheimer's Disease  . Sister  Alive  . Son  Alive        Her family history includes COPD in her mother; Diabetes in her mother; Healthy in her sister and son; Heart disease in her mother.     No Known Allergies   Current Outpatient Medications:  .  aspirin EC 81 MG tablet, Take by mouth., Disp: , Rfl:  .  furosemide (LASIX) 20 MG tablet, Take 1 tablet (20 mg total) by mouth daily., Disp: 90 tablet, Rfl: 3 .  hydrochlorothiazide (HYDRODIURIL) 25 MG tablet,  Take 1 tablet (25 mg total) by mouth daily., Disp: 90 tablet, Rfl: 3 .  losartan (COZAAR) 50 MG tablet, TAKE ONE TABLET BY MOUTH ONCE DAILY, Disp: 90 tablet, Rfl: 3 .  metoprolol tartrate (LOPRESSOR) 25 MG tablet, Take 1 tablet (25 mg total) by mouth daily., Disp: 90 tablet, Rfl: 3 .  triamcinolone (KENALOG) 0.025 % ointment, Apply 1 application topically 2 (two) times daily. As needed for eczema, Disp: 454 g, Rfl: 3   Patient Care Team: Mar Daring, PA-C as PCP - General (Family Medicine)      Objective:   Vitals: BP 120/66 (BP Location: Left Arm, Patient Position: Sitting, Cuff Size: Large)   Pulse 80   Temp 97.7 F (36.5 C) (Oral)   Resp 16   Ht 5\' 5"  (1.651 m)   Wt 263 lb (119.3 kg)   SpO2 99%   BMI 43.77 kg/m    Vitals:   10/09/17 0810  BP: 120/66  Pulse: 80  Resp: 16  Temp: 97.7 F (36.5 C)  TempSrc: Oral  SpO2: 99%  Weight: 263 lb (119.3 kg)  Height: 5\' 5"  (1.651 m)     Physical Exam  Constitutional: She is oriented to person, place, and time. She appears well-developed and well-nourished. No distress.  HENT:  Head: Normocephalic and atraumatic.  Right Ear: Hearing, tympanic membrane, external ear and ear canal normal.  Left Ear: Hearing, tympanic membrane, external ear and ear canal normal.  Nose: Nose normal.  Mouth/Throat: Uvula is midline, oropharynx is clear and moist and mucous membranes are normal. No oropharyngeal exudate.  Eyes: Conjunctivae and EOM are normal. Pupils are equal, round, and reactive to light. Right eye exhibits no discharge. Left eye exhibits no discharge. No scleral icterus.  Neck: Normal range of motion. Neck supple. No JVD present. Carotid bruit is not present. No tracheal deviation present. No thyromegaly present.  Cardiovascular: Normal rate, regular rhythm, normal heart sounds and intact distal pulses. Exam reveals no gallop and no friction rub.  No murmur heard. Pulmonary/Chest: Effort normal and breath sounds normal. No  respiratory distress. She has no decreased breath sounds. She has no wheezes. She has no rales. She exhibits no tenderness. Right breast exhibits no inverted nipple, no mass, no nipple discharge, no skin change and no tenderness. Left breast exhibits no inverted nipple, no mass, no nipple discharge, no skin change and no tenderness. Breasts are symmetrical.  Abdominal: Soft. Bowel sounds are normal. She exhibits no distension and no mass. There is no tenderness. There is no rebound and no guarding.  Musculoskeletal: She exhibits edema and tenderness.  Chronic lymphedema of the right lower extremity  Lymphadenopathy:    She has no cervical adenopathy.  Neurological: She is alert and oriented to person, place, and time.  Skin: Skin is warm and dry. No rash noted. She is not diaphoretic.  Psychiatric: She has a normal mood and affect.  Her behavior is normal. Judgment and thought content normal.  Vitals reviewed.    Depression Screen PHQ 2/9 Scores 09/04/2016  PHQ - 2 Score 0      Assessment & Plan:     Routine Health Maintenance and Physical Exam  Exercise Activities and Dietary recommendations Goals    . Increase physical activity    . Reduce calorie intake to 1500 calories per day    . Reduce salt intake to 2 grams per day or less       Immunization History  Administered Date(s) Administered  . Influenza,inj,Quad PF,6+ Mos 09/08/2015, 09/04/2016  . Tdap 09/04/2016    Health Maintenance  Topic Date Due  . HIV Screening  10/08/1970  . INFLUENZA VACCINE  06/11/2017  . MAMMOGRAM  09/12/2018  . PAP SMEAR  09/05/2019  . COLONOSCOPY  10/28/2021  . TETANUS/TDAP  09/04/2026  . Hepatitis C Screening  Completed     Discussed health benefits of physical activity, and encouraged her to engage in regular exercise appropriate for her age and condition.    1. Annual physical exam Normal physical exam today. Will check labs as below and f/u pending lab results. If labs are stable  and WNL she will not need to have these rechecked for one year at her next annual physical exam. She is to call the office in the meantime if she has any acute issue, questions or concerns. - Lipid panel  2. Breast cancer screening Breast exam normal today. Patient has mammograms at [place]. Phone number given to patient for her to call and schedule.   3. Essential (primary) hypertension Stable and well controlled today. Continue Furosemide 20mg , HCTZ 25mg , losartan 50mg , metoprolol 25mg . Will check labs as below and f/u pending results. - CBC with Differential/Platelet - Comprehensive metabolic panel - Lipid panel  4. Hyperthyroidism, subclinical Previously stable. Will check labs as below and f/u pending results. - Lipid panel - TSH  5. Morbid obesity with BMI of 45.0-49.9, adult ([place]) Limited with exercise due to severe chronic lymphedema. Will check labs as below and f/u pending results. - Lipid panel  6. Screening for HIV (human immunodeficiency virus) - HIV antibody  7. Chronic eczema Will give prednisone as below to calm down current eczema flare. Information given to patient for [facility] as she wants to call to schedule her own appt.  - predniSONE (DELTASONE) 10 MG tablet; Take 6 tabs PO on day 1&2, 5 tabs PO on day 3&4, 4 tabs PO on day 5&6, 3 tabs PO on day 7&8, 2 tabs PO on day 9&10, 1 tab PO on day 11&12.  Dispense: 42 tablet; Refill: 0  8. Acquired lymphedema of leg Currently stable and unchanged. On Furosemide 20mg  and HCTZ 25mg  for fluid control.   9. Need for influenza vaccination Flu vaccine given today without complication. Patient sat upright for 15 minutes to check for adverse reaction before being released. - Flu Vaccine QUAD 36+ mos IM  --------------------------------------------------------------------    Mar Daring, PA-C  [facility]

## 2017-10-10 ENCOUNTER — Telehealth: Payer: Self-pay

## 2017-10-10 LAB — LIPID PANEL
CHOL/HDL RATIO: 2.2 (calc) (ref <5.0)
Cholesterol: 172 mg/dL (ref <200)
HDL: 78 mg/dL (ref >50)
LDL CHOLESTEROL (CALC): 76 mg/dL
NON-HDL CHOLESTEROL (CALC): 94 mg/dL (ref <130)
TRIGLYCERIDES: 93 mg/dL (ref <150)

## 2017-10-10 LAB — COMPLETE METABOLIC PANEL WITH GFR
AG RATIO: 1.2 (calc) (ref 1.0–2.5)
ALKALINE PHOSPHATASE (APISO): 93 U/L (ref 33–130)
ALT: 17 U/L (ref 6–29)
AST: 16 U/L (ref 10–35)
Albumin: 4.1 g/dL (ref 3.6–5.1)
BILIRUBIN TOTAL: 0.4 mg/dL (ref 0.2–1.2)
BUN: 15 mg/dL (ref 7–25)
CHLORIDE: 99 mmol/L (ref 98–110)
CO2: 29 mmol/L (ref 20–32)
Calcium: 9.3 mg/dL (ref 8.6–10.4)
Creat: 0.76 mg/dL (ref 0.50–0.99)
GFR, EST AFRICAN AMERICAN: 97 mL/min/{1.73_m2} (ref >=60)
GFR, Est Non African American: 84 mL/min/{1.73_m2} (ref >=60)
GLUCOSE: 105 mg/dL — AB (ref 65–99)
Globulin: 3.5 g/dL (calc) (ref 1.9–3.7)
Potassium: 3.3 mmol/L — ABNORMAL LOW (ref 3.5–5.3)
Sodium: 138 mmol/L (ref 135–146)
Total Protein: 7.6 g/dL (ref 6.1–8.1)

## 2017-10-10 LAB — CBC WITH DIFFERENTIAL/PLATELET
BASOS PCT: 0.6 %
Basophils Absolute: 40 cells/uL (ref 0–200)
EOS ABS: 442 {cells}/uL (ref 15–500)
Eosinophils Relative: 6.7 %
HEMATOCRIT: 36.9 % (ref 35.0–45.0)
Hemoglobin: 12.3 g/dL (ref 11.7–15.5)
LYMPHS ABS: 1511 {cells}/uL (ref 850–3900)
MCH: 28.5 pg (ref 27.0–33.0)
MCHC: 33.3 g/dL (ref 32.0–36.0)
MCV: 85.6 fL (ref 80.0–100.0)
MPV: 11.6 fL (ref 7.5–12.5)
Monocytes Relative: 6.9 %
Neutro Abs: 4151 cells/uL (ref 1500–7800)
Neutrophils Relative %: 62.9 %
Platelets: 306 10*3/uL (ref 140–400)
RBC: 4.31 10*6/uL (ref 3.80–5.10)
RDW: 13.8 % (ref 11.0–15.0)
Total Lymphocyte: 22.9 %
WBC: 6.6 10*3/uL (ref 3.8–10.8)
WBCMIX: 455 {cells}/uL (ref 200–950)

## 2017-10-10 LAB — HIV ANTIBODY (ROUTINE TESTING W REFLEX): HIV 1&2 Ab, 4th Generation: NONREACTIVE

## 2017-10-10 LAB — TSH: TSH: 0.44 m[IU]/L (ref 0.40–4.50)

## 2017-10-10 NOTE — Telephone Encounter (Signed)
-----   Message from Mar Daring, [place] sent at 10/10/2017  9:24 AM EST ----- All labs are WNL and stable with exception of potassium which is borderline low at 3.3, cut off is 3.5. Would recommend to increase potassium rich foods in diet such as, sweet potatoes, broccoli, bananas, leafy greens. Can recheck potassium in 4-6 weeks if wanted.

## 2017-10-10 NOTE — Telephone Encounter (Signed)
LMTCB  Thanks,  -Joseline 

## 2017-10-13 NOTE — Telephone Encounter (Signed)
Patient advised as below. Patient verbalizes understanding and is in agreement with treatment plan.  

## 2017-10-29 LAB — HM MAMMOGRAPHY

## 2017-10-30 ENCOUNTER — Other Ambulatory Visit: Payer: Self-pay | Admitting: Physician Assistant

## 2017-10-30 DIAGNOSIS — I10 Essential (primary) hypertension: Secondary | ICD-10-CM

## 2017-10-30 DIAGNOSIS — L309 Dermatitis, unspecified: Secondary | ICD-10-CM

## 2017-11-05 ENCOUNTER — Telehealth: Payer: Self-pay | Admitting: Physician Assistant

## 2017-11-05 DIAGNOSIS — L309 Dermatitis, unspecified: Secondary | ICD-10-CM

## 2017-11-05 MED ORDER — TRIAMCINOLONE ACETONIDE 0.025 % EX OINT: TOPICAL_OINTMENT | CUTANEOUS | 1 refills | Status: DC

## 2017-11-05 NOTE — Telephone Encounter (Signed)
Sent in Rx for jar

## 2017-11-05 NOTE — Telephone Encounter (Signed)
Patient advised.

## 2017-11-05 NOTE — Telephone Encounter (Signed)
Pt needs Triacinolone RX changed from small tube to 1 lb Tub.  States the medication was called in to [facility] with in the incorrect amount.

## 2017-11-14 ENCOUNTER — Encounter: Payer: Self-pay | Admitting: Physician Assistant

## 2017-11-18 ENCOUNTER — Telehealth: Payer: Self-pay

## 2017-11-18 NOTE — Telephone Encounter (Signed)
Left message for patient to see if [facility] imaging got in contact with her for further imaging from her last mammogram that was done 12/19. Since we have not receive results.

## 2017-12-25 ENCOUNTER — Telehealth: Payer: Self-pay

## 2017-12-25 ENCOUNTER — Ambulatory Visit: Payer: BC Managed Care – PPO | Admitting: Physician Assistant

## 2017-12-25 NOTE — Telephone Encounter (Signed)
Agreed -

## 2017-12-25 NOTE — Telephone Encounter (Signed)
Noted  

## 2017-12-25 NOTE — Telephone Encounter (Signed)
Pt called reporting of lower left sided "back spasms"  She states it started Sunday but has become severe last night.  Pt sounded very distressed on the phone.  She first requested for a prescription for the pain.  I told her she would have to be seen in the office first.  She said she was in too much pain to come in.  I advised her that if she was in this much back it would be better if she went to the ED to be evaluated she refused.  She finally agree to an appointment at 3:45 with you.   Thanks,   -Mickel Baas

## 2017-12-25 NOTE — Telephone Encounter (Signed)
Called to check on patient per [facility] and patient sound in a lot of distress. Per patient she sleep through her appointment and the pain is worse states " I can barely make to the bathroom". Advised patient that she needed to go to the ED.  Thanks,  -Niketa Turner

## 2018-01-23 ENCOUNTER — Telehealth: Payer: Self-pay | Admitting: Physician Assistant

## 2018-01-23 DIAGNOSIS — I10 Essential (primary) hypertension: Secondary | ICD-10-CM

## 2018-01-23 MED ORDER — OLMESARTAN MEDOXOMIL 20 MG PO TABS: 20.0000 mg | ORAL_TABLET | Freq: Every day | ORAL | 1 refills | Status: DC

## 2018-01-23 NOTE — Telephone Encounter (Signed)
Will change from losartan to olmesartan

## 2018-01-23 NOTE — Telephone Encounter (Signed)
Patient states that [facility] called her telling her to stop taking her losartan (COZAAR) 50 MG tablet.  She is wanting to know if she needs to stop taking it or if she needs to start taking something else.

## 2018-01-23 NOTE — Telephone Encounter (Signed)
Patient reports that the pharmacy told her that the medication was recalled. She is requesting that you send in the new medication for 90 day supply. Thanks!

## 2018-01-23 NOTE — Telephone Encounter (Signed)
L/M for patient to call back to see if the pharmacist told her why she should stop taking the medication.

## 2018-08-09 ENCOUNTER — Other Ambulatory Visit: Payer: Self-pay | Admitting: Physician Assistant

## 2018-08-09 DIAGNOSIS — I10 Essential (primary) hypertension: Secondary | ICD-10-CM

## 2018-08-10 ENCOUNTER — Telehealth: Payer: Self-pay | Admitting: Physician Assistant

## 2018-08-10 NOTE — Telephone Encounter (Signed)
Please Review

## 2018-08-10 NOTE — Telephone Encounter (Signed)
Patient switched to olmesartan 20mg .

## 2018-08-10 NOTE — Telephone Encounter (Signed)
[facility] faxed refill request for the following medications:  losartan (COZAAR) 50 MG tablet  90 day supply  Last date filled: 05/04/2018  Last Rx: 09/10/2017 Medication was noted to be discontinued on 01/23/2018 due to change in therapy. LOV: 10/09/17 Please advise. Thanks TNP

## 2018-08-11 NOTE — Telephone Encounter (Signed)
Pt called checking on the status of the refill request. Pt stated she has been out of them medication since Friday. Please advise pt with the info from Jenni's response below. Thanks TNP

## 2018-08-12 ENCOUNTER — Other Ambulatory Visit: Payer: Self-pay

## 2018-08-12 DIAGNOSIS — I10 Essential (primary) hypertension: Secondary | ICD-10-CM

## 2018-08-12 MED ORDER — HYDROCHLOROTHIAZIDE 25 MG PO TABS: 25.0000 mg | ORAL_TABLET | Freq: Every day | ORAL | 3 refills | Status: DC

## 2018-08-12 MED ORDER — OLMESARTAN MEDOXOMIL 20 MG PO TABS: 20.0000 mg | ORAL_TABLET | Freq: Every day | ORAL | 3 refills | Status: DC

## 2018-08-12 MED ORDER — METOPROLOL TARTRATE 25 MG PO TABS: 25.0000 mg | ORAL_TABLET | Freq: Every day | ORAL | 3 refills | Status: DC

## 2018-08-12 NOTE — Telephone Encounter (Signed)
Patient requesting refills on the following medications. HCTZ,Metoprolol and Olmesartan to be send to [facility].  Thanks,  -Tishina Lown

## 2018-10-23 ENCOUNTER — Other Ambulatory Visit: Payer: Self-pay

## 2018-10-23 ENCOUNTER — Encounter: Payer: Self-pay | Admitting: Physician Assistant

## 2018-10-23 ENCOUNTER — Ambulatory Visit (INDEPENDENT_AMBULATORY_CARE_PROVIDER_SITE_OTHER): Payer: BC Managed Care – PPO | Admitting: Physician Assistant

## 2018-10-23 VITALS — BP 134/84 | HR 89 | Temp 98.0°F | Resp 16 | Ht 62.0 in | Wt 257.0 lb

## 2018-10-23 DIAGNOSIS — Z6841 Body Mass Index (BMI) 40.0 and over, adult: Secondary | ICD-10-CM

## 2018-10-23 DIAGNOSIS — Z0000 Encounter for general adult medical examination without abnormal findings: Secondary | ICD-10-CM

## 2018-10-23 DIAGNOSIS — Z1239 Encounter for other screening for malignant neoplasm of breast: Secondary | ICD-10-CM

## 2018-10-23 DIAGNOSIS — Z23 Encounter for immunization: Secondary | ICD-10-CM

## 2018-10-23 DIAGNOSIS — I10 Essential (primary) hypertension: Secondary | ICD-10-CM

## 2018-10-23 DIAGNOSIS — E0590 Thyrotoxicosis, unspecified without thyrotoxic crisis or storm: Secondary | ICD-10-CM

## 2018-10-23 DIAGNOSIS — I890 Lymphedema, not elsewhere classified: Secondary | ICD-10-CM

## 2018-10-23 DIAGNOSIS — R7309 Other abnormal glucose: Secondary | ICD-10-CM

## 2018-10-23 DIAGNOSIS — L97511 Non-pressure chronic ulcer of other part of right foot limited to breakdown of skin: Secondary | ICD-10-CM

## 2018-10-23 NOTE — Patient Instructions (Signed)
Health Maintenance for Postmenopausal Women Menopause is a normal process in which your reproductive ability comes to an end. This process happens gradually over a span of months to years, usually between the ages of 22 and 9. Menopause is complete when you have missed 12 consecutive menstrual periods. It is important to talk with your health care provider about some of the most common conditions that affect postmenopausal women, such as heart disease, cancer, and bone loss (osteoporosis). Adopting a healthy lifestyle and getting preventive care can help to promote your health and wellness. Those actions can also lower your chances of developing some of these common conditions. What should I know about menopause? During menopause, you may experience a number of symptoms, such as:  Moderate-to-severe hot flashes.  Night sweats.  Decrease in sex drive.  Mood swings.  Headaches.  Tiredness.  Irritability.  Memory problems.  Insomnia.  Choosing to treat or not to treat menopausal changes is an individual decision that you make with your health care provider. What should I know about hormone replacement therapy and supplements? Hormone therapy products are effective for treating symptoms that are associated with menopause, such as hot flashes and night sweats. Hormone replacement carries certain risks, especially as you become older. If you are thinking about using estrogen or estrogen with progestin treatments, discuss the benefits and risks with your health care provider. What should I know about heart disease and stroke? Heart disease, heart attack, and stroke become more likely as you age. This may be due, in part, to the hormonal changes that your body experiences during menopause. These can affect how your body processes dietary fats, triglycerides, and cholesterol. Heart attack and stroke are both medical emergencies. There are many things that you can do to help prevent heart disease  and stroke:  Have your blood pressure checked at least every 1-2 years. High blood pressure causes heart disease and increases the risk of stroke.  If you are 53-22 years old, ask your health care provider if you should take aspirin to prevent a heart attack or a stroke.  Do not use any tobacco products, including cigarettes, chewing tobacco, or electronic cigarettes. If you need help quitting, ask your health care provider.  It is important to eat a healthy diet and maintain a healthy weight. ? Be sure to include plenty of vegetables, fruits, low-fat dairy products, and lean protein. ? Avoid eating foods that are high in solid fats, added sugars, or salt (sodium).  Get regular exercise. This is one of the most important things that you can do for your health. ? Try to exercise for at least 150 minutes each week. The type of exercise that you do should increase your heart rate and make you sweat. This is known as moderate-intensity exercise. ? Try to do strengthening exercises at least twice each week. Do these in addition to the moderate-intensity exercise.  Know your numbers.Ask your health care provider to check your cholesterol and your blood glucose. Continue to have your blood tested as directed by your health care provider.  What should I know about cancer screening? There are several types of cancer. Take the following steps to reduce your risk and to catch any cancer development as early as possible. Breast Cancer  Practice breast self-awareness. ? This means understanding how your breasts normally appear and feel. ? It also means doing regular breast self-exams. Let your health care provider know about any changes, no matter how small.  If you are 40  or older, have a clinician do a breast exam (clinical breast exam or CBE) every year. Depending on your age, family history, and medical history, it may be recommended that you also have a yearly breast X-ray (mammogram).  If you  have a family history of breast cancer, talk with your health care provider about genetic screening.  If you are at high risk for breast cancer, talk with your health care provider about having an MRI and a mammogram every year.  Breast cancer (BRCA) gene test is recommended for women who have family members with BRCA-related cancers. Results of the assessment will determine the need for genetic counseling and BRCA1 and for BRCA2 testing. BRCA-related cancers include these types: ? Breast. This occurs in males or females. ? Ovarian. ? Tubal. This may also be called fallopian tube cancer. ? Cancer of the abdominal or pelvic lining (peritoneal cancer). ? Prostate. ? Pancreatic.  Cervical, Uterine, and Ovarian Cancer Your health care provider may recommend that you be screened regularly for cancer of the pelvic organs. These include your ovaries, uterus, and vagina. This screening involves a pelvic exam, which includes checking for microscopic changes to the surface of your cervix (Pap test).  For women ages 21-65, health care providers may recommend a pelvic exam and a Pap test every three years. For women ages 79-65, they may recommend the Pap test and pelvic exam, combined with testing for human papilloma virus (HPV), every five years. Some types of HPV increase your risk of cervical cancer. Testing for HPV may also be done on women of any age who have unclear Pap test results.  Other health care providers may not recommend any screening for nonpregnant women who are considered low risk for pelvic cancer and have no symptoms. Ask your health care provider if a screening pelvic exam is right for you.  If you have had past treatment for cervical cancer or a condition that could lead to cancer, you need Pap tests and screening for cancer for at least 20 years after your treatment. If Pap tests have been discontinued for you, your risk factors (such as having a new sexual partner) need to be  reassessed to determine if you should start having screenings again. Some women have medical problems that increase the chance of getting cervical cancer. In these cases, your health care provider may recommend that you have screening and Pap tests more often.  If you have a family history of uterine cancer or ovarian cancer, talk with your health care provider about genetic screening.  If you have vaginal bleeding after reaching menopause, tell your health care provider.  There are currently no reliable tests available to screen for ovarian cancer.  Lung Cancer Lung cancer screening is recommended for adults 69-62 years old who are at high risk for lung cancer because of a history of smoking. A yearly low-dose CT scan of the lungs is recommended if you:  Currently smoke.  Have a history of at least 30 pack-years of smoking and you currently smoke or have quit within the past 15 years. A pack-year is smoking an average of one pack of cigarettes per day for one year.  Yearly screening should:  Continue until it has been 15 years since you quit.  Stop if you develop a health problem that would prevent you from having lung cancer treatment.  Colorectal Cancer  This type of cancer can be detected and can often be prevented.  Routine colorectal cancer screening usually begins at  age 42 and continues through age 45.  If you have risk factors for colon cancer, your health care provider may recommend that you be screened at an earlier age.  If you have a family history of colorectal cancer, talk with your health care provider about genetic screening.  Your health care provider may also recommend using home test kits to check for hidden blood in your stool.  A small camera at the end of a tube can be used to examine your colon directly (sigmoidoscopy or colonoscopy). This is done to check for the earliest forms of colorectal cancer.  Direct examination of the colon should be repeated every  5-10 years until age 71. However, if early forms of precancerous polyps or small growths are found or if you have a family history or genetic risk for colorectal cancer, you may need to be screened more often.  Skin Cancer  Check your skin from head to toe regularly.  Monitor any moles. Be sure to tell your health care provider: ? About any new moles or changes in moles, especially if there is a change in a mole's shape or color. ? If you have a mole that is larger than the size of a pencil eraser.  If any of your family members has a history of skin cancer, especially at a young age, talk with your health care provider about genetic screening.  Always use sunscreen. Apply sunscreen liberally and repeatedly throughout the day.  Whenever you are outside, protect yourself by wearing long sleeves, pants, a wide-brimmed hat, and sunglasses.  What should I know about osteoporosis? Osteoporosis is a condition in which bone destruction happens more quickly than new bone creation. After menopause, you may be at an increased risk for osteoporosis. To help prevent osteoporosis or the bone fractures that can happen because of osteoporosis, the following is recommended:  If you are 46-71 years old, get at least 1,000 mg of calcium and at least 600 mg of vitamin D per day.  If you are older than age 55 but younger than age 65, get at least 1,200 mg of calcium and at least 600 mg of vitamin D per day.  If you are older than age 54, get at least 1,200 mg of calcium and at least 800 mg of vitamin D per day.  Smoking and excessive alcohol intake increase the risk of osteoporosis. Eat foods that are rich in calcium and vitamin D, and do weight-bearing exercises several times each week as directed by your health care provider. What should I know about how menopause affects my mental health? Depression may occur at any age, but it is more common as you become older. Common symptoms of depression  include:  Low or sad mood.  Changes in sleep patterns.  Changes in appetite or eating patterns.  Feeling an overall lack of motivation or enjoyment of activities that you previously enjoyed.  Frequent crying spells.  Talk with your health care provider if you think that you are experiencing depression. What should I know about immunizations? It is important that you get and maintain your immunizations. These include:  Tetanus, diphtheria, and pertussis (Tdap) booster vaccine.  Influenza every year before the flu season begins.  Pneumonia vaccine.  Shingles vaccine.  Your health care provider may also recommend other immunizations. This information is not intended to replace advice given to you by your health care provider. Make sure you discuss any questions you have with your health care provider. Document Released: 12/20/2005  Document Revised: 05/17/2016 Document Reviewed: 08/01/2015 [employer] Interactive Patient Education  2018 [employer]  

## 2018-10-23 NOTE — Progress Notes (Signed)
Patient: Autumn Burton, Female    DOB: [birth year], 62 y.o.   MRN: [id number] Visit Date: 10/23/2018  Today's Provider: Mar Daring, PA-C   Chief Complaint  Patient presents with  . Annual Exam   Subjective:    Annual physical exam Autumn Burton is a 62 y.o. female who presents today for health maintenance and complete physical. She feels well. She reports exercising none. She reports she is sleeping poorly. Patient reports that her husband passed away in 10-Jul-2023.  2017/10/12 CPE 09/04/16 Pap/HPV-negative 10/28/16 Colonoscopy-polyps 11/12/17 Mammogram-BI-RADS 2 -----------------------------------------------------------------   Review of Systems  Constitutional: Negative.   HENT: Negative.   Eyes: Negative.   Respiratory: Negative.   Cardiovascular: Negative.   Gastrointestinal: Negative.   Endocrine: Negative.   Genitourinary: Positive for urgency.  Musculoskeletal: Negative.   Skin: Negative.   Allergic/Immunologic: Negative.   Neurological: Negative.   Hematological: Negative.   Psychiatric/Behavioral: Negative.     Social History      She  reports that she has never smoked. She has never used smokeless tobacco. She reports that she does not drink alcohol or use drugs.       Social History   Socioeconomic History  . Marital status: Married    Spouse name: Not on file  . Number of children: 1  . Years of education: Not on file  . Highest education level: Not on file  Occupational History  . Not on file  Social Needs  . Financial resource strain: Not on file  . Food insecurity:    Worry: Not on file    Inability: Not on file  . Transportation needs:    Medical: Not on file    Non-medical: Not on file  Tobacco Use  . Smoking status: Never Smoker  . Smokeless tobacco: Never Used  Substance and Sexual Activity  . Alcohol use: No    Alcohol/week: 0.0 standard drinks  . Drug use: No  . Sexual activity: Not on file  Lifestyle  .  Physical activity:    Days per week: Not on file    Minutes per session: Not on file  . Stress: Not on file  Relationships  . Social connections:    Talks on phone: Not on file    Gets together: Not on file    Attends religious service: Not on file    Active member of club or organization: Not on file    Attends meetings of clubs or organizations: Not on file    Relationship status: Not on file  Other Topics Concern  . Not on file  Social History Narrative  . Not on file    Past Medical History:  Diagnosis Date  . Hypertension      Patient Active Problem List   Diagnosis Date Noted  . Hx of colonic polyps   . Polyp of sigmoid colon   . Morbid obesity with BMI of 45.0-49.9, adult ([place]) 03/06/2016  . Chronic eczema 01/17/2015  . Cough due to ACE inhibitor 01/17/2015  . Essential (primary) hypertension 01/17/2015  . Secondary osteoarthritis of right ankle 01/17/2015  . Abscess or cellulitis of leg 11/25/2014  . Acquired lymphedema of leg 11/25/2014  . Infection and inflammatory reaction due to internal orthopedic device, implant, and graft ([place]) 05/01/2010  . Pseudoarthrosis of cervical spine ([place]) 01/12/2010  . Hyperthyroidism, subclinical 01/12/2010  . Tachycardia 01/12/2010  . Nonunion of fracture 01/12/2010    Past Surgical History:  Procedure Laterality Date  .  COLONOSCOPY WITH PROPOFOL N/A 10/28/2016   Procedure: COLONOSCOPY WITH PROPOFOL;  Surgeon: Jonathon Bellows, MD;  Location: [facility] ENDOSCOPY;  Service: Endoscopy;  Laterality: N/A;  . DG RIGHT TIBIA AND FIBULA ([place] HX)    . LEG SURGERY Right     Family History        Family Status  Relation Name Status  . Mother  Alive  . Father  Deceased at age 69       cause of death was Alzheimer's Disease  . Sister  Alive  . Son  Alive        Her family history includes COPD in her mother; Diabetes in her mother; Healthy in her sister and son; Heart disease in her mother.      No Known Allergies   Current  Outpatient Medications:  .  aspirin EC 81 MG tablet, Take by mouth., Disp: , Rfl:  .  furosemide (LASIX) 20 MG tablet, Take 1 tablet (20 mg total) by mouth daily., Disp: 90 tablet, Rfl: 3 .  hydrochlorothiazide (HYDRODIURIL) 25 MG tablet, Take 1 tablet (25 mg total) by mouth daily., Disp: 90 tablet, Rfl: 3 .  metoprolol tartrate (LOPRESSOR) 25 MG tablet, Take 1 tablet (25 mg total) by mouth daily., Disp: 90 tablet, Rfl: 3 .  olmesartan (BENICAR) 20 MG tablet, Take 1 tablet (20 mg total) by mouth daily., Disp: 90 tablet, Rfl: 3 .  triamcinolone (KENALOG) 0.025 % ointment, APPLY TOPICALLY TWICE DAILY AS NEEDED FOR ECZEMA., Disp: 454 g, Rfl: 1   Patient Care Team: Mar Daring, PA-C as PCP - General (Family Medicine)      Objective:   Vitals: BP 134/84 (BP Location: Left Arm, Patient Position: Sitting, Cuff Size: Large)   Pulse 89   Temp 98 F (36.7 C) (Oral)   Resp 16   Ht 5\' 2"  (1.575 m)   Wt 257 lb (116.6 kg)   BMI 47.01 kg/m    Vitals:   10/23/18 0913  BP: 134/84  Pulse: 89  Resp: 16  Temp: 98 F (36.7 C)  TempSrc: Oral  Weight: 257 lb (116.6 kg)  Height: 5\' 2"  (1.575 m)     Physical Exam Vitals signs reviewed.  Constitutional:      General: She is not in acute distress.    Appearance: She is well-developed. She is not diaphoretic.  HENT:     Head: Normocephalic and atraumatic.     Right Ear: External ear normal.     Left Ear: External ear normal.     Nose: Nose normal.     Mouth/Throat:     Pharynx: No oropharyngeal exudate.  Eyes:     General: No scleral icterus.       Right eye: No discharge.        Left eye: No discharge.     Conjunctiva/sclera: Conjunctivae normal.     Pupils: Pupils are equal, round, and reactive to light.  Neck:     Musculoskeletal: Normal range of motion and neck supple.     Thyroid: No thyromegaly.     Vascular: No carotid bruit or JVD.     Trachea: No tracheal deviation.  Cardiovascular:     Rate and Rhythm: Normal rate  and regular rhythm.     Heart sounds: Normal heart sounds. No murmur. No friction rub. No gallop.   Pulmonary:     Effort: Pulmonary effort is normal. No respiratory distress.     Breath sounds: Normal breath sounds. No wheezing or rales.  Chest:     Chest wall: No tenderness.  Abdominal:     General: Bowel sounds are normal. There is no distension.     Palpations: Abdomen is soft. There is no mass.     Tenderness: There is no abdominal tenderness. There is no guarding or rebound.  Musculoskeletal: Normal range of motion.        General: No tenderness.  Lymphadenopathy:     Cervical: No cervical adenopathy.  Skin:    General: Skin is warm and dry.     Findings: No rash.       Neurological:     Mental Status: She is alert and oriented to person, place, and time.  Psychiatric:        Behavior: Behavior normal.        Thought Content: Thought content normal.        Judgment: Judgment normal.      Depression Screen PHQ 2/9 Scores 10/23/2018 10/09/2017 09/04/2016  PHQ - 2 Score 2 0 0  PHQ- 9 Score 6 0 -      Assessment & Plan:     Routine Health Maintenance and Physical Exam  Exercise Activities and Dietary recommendations Goals    . Increase physical activity    . Reduce calorie intake to 1500 calories per day    . Reduce salt intake to 2 grams per day or less       Immunization History  Administered Date(s) Administered  . Influenza, Seasonal, Injecte, Preservative Fre 09/22/2007  . Influenza,inj,Quad PF,6+ Mos 09/08/2015, 09/04/2016, 10/09/2017  . Influenza-Unspecified 11/11/2014  . Tdap 09/04/2016    Health Maintenance  Topic Date Due  . INFLUENZA VACCINE  06/11/2018  . MAMMOGRAM  10/29/2018  . PAP SMEAR-Modifier  09/05/2019  . TETANUS/TDAP  09/04/2026  . COLONOSCOPY  10/28/2026  . Hepatitis C Screening  Completed  . HIV Screening  Completed     Discussed health benefits of physical activity, and encouraged her to engage in regular exercise  appropriate for her age and condition.    1. Annual physical exam Normal physical exam today. Will check labs as below and f/u pending lab results. If labs are stable and WNL she will not need to have these rechecked for one year at her next annual physical exam. She is to call the office in the meantime if she has any acute issue, questions or concerns.  2. Breast cancer screening There is no family history of breast cancer. She does perform regular self breast exams. Mammogram was ordered as below. Information for [facility] Breast clinic was given to patient so she may schedule her mammogram at her convenience. - MM 3D SCREEN BREAST BILATERAL; Future  3. Essential hypertension Stable. Continue metoprolol 25mg  daily, HCTZ 25mg  and olmesartan 20mg  daily. Will check labs as below and f/u pending results. - CBC with Differential/Platelet - Comprehensive metabolic panel - Lipid Panel With LDL/HDL Ratio - HgB A1c  4. Hyperthyroidism, subclinical Stable. No treatment. Will check labs as below and f/u pending results. - TSH  5. Morbid obesity with BMI of 45.0-49.9, adult ([place]) Counseled patient on healthy lifestyle modifications including dieting and exercise.  - CBC with Differential/Platelet - Comprehensive metabolic panel - Lipid Panel With LDL/HDL Ratio - HgB A1c  6. Elevated glucose History of this over the last few years. Will check labs as below and f/u pending results. - Comprehensive metabolic panel - Lipid Panel With LDL/HDL Ratio - HgB A1c  7. Ulcer of great toe, right, limited  to breakdown of skin ([place]) New. Reports increasing toe pain over the last few months and "callus" formation. On exam there is a hard callus with central ulceration. May be severe corn and advised patient to pad more until evaluated by podiatry. Due to severe lymphedema will refer just to be on the safe side as she may have prolonged healing.  - Ambulatory referral to Podiatry  8. Acquired lymphedema of  leg Unchanged. Does have pumps at home.   9. Need for influenza vaccination Flu vaccine given today without complication. Patient sat upright for 15 minutes to check for adverse reaction before being released. - Flu Vaccine QUAD 36+ mos IM  --------------------------------------------------------------------    Mar Daring, PA-C  [facility]

## 2018-10-24 LAB — HEMOGLOBIN A1C
Est. average glucose Bld gHb Est-mCnc: 134 mg/dL
Hgb A1c MFr Bld: 6.3 % — ABNORMAL HIGH (ref 4.8–5.6)

## 2018-10-24 LAB — CBC WITH DIFFERENTIAL/PLATELET
Basophils Absolute: 0 10*3/uL (ref 0.0–0.2)
Basos: 1 %
EOS (ABSOLUTE): 0.3 10*3/uL (ref 0.0–0.4)
EOS: 5 %
HEMATOCRIT: 36.8 % (ref 34.0–46.6)
HEMOGLOBIN: 12.1 g/dL (ref 11.1–15.9)
IMMATURE GRANS (ABS): 0 10*3/uL (ref 0.0–0.1)
Immature Granulocytes: 0 %
LYMPHS: 30 %
Lymphocytes Absolute: 1.8 10*3/uL (ref 0.7–3.1)
MCH: 30.2 pg (ref 26.6–33.0)
MCHC: 32.9 g/dL (ref 31.5–35.7)
MCV: 92 fL (ref 79–97)
Monocytes Absolute: 0.6 10*3/uL (ref 0.1–0.9)
Monocytes: 10 %
NEUTROS PCT: 54 %
Neutrophils Absolute: 3.3 10*3/uL (ref 1.4–7.0)
Platelets: 313 10*3/uL (ref 150–450)
RBC: 4.01 x10E6/uL (ref 3.77–5.28)
RDW: 13.1 % (ref 12.3–15.4)
WBC: 6 10*3/uL (ref 3.4–10.8)

## 2018-10-24 LAB — COMPREHENSIVE METABOLIC PANEL
A/G RATIO: 1.2 (ref 1.2–2.2)
ALT: 13 IU/L (ref 0–32)
AST: 17 IU/L (ref 0–40)
Albumin: 4.1 g/dL (ref 3.6–4.8)
Alkaline Phosphatase: 83 IU/L (ref 39–117)
BUN/Creatinine Ratio: 20 (ref 12–28)
BUN: 19 mg/dL (ref 8–27)
Bilirubin Total: 0.4 mg/dL (ref 0.0–1.2)
CO2: 24 mmol/L (ref 20–29)
Calcium: 9.7 mg/dL (ref 8.7–10.3)
Chloride: 99 mmol/L (ref 96–106)
Creatinine, Ser: 0.95 mg/dL (ref 0.57–1.00)
GFR, EST AFRICAN AMERICAN: 74 mL/min/{1.73_m2} (ref >59)
GFR, EST NON AFRICAN AMERICAN: 64 mL/min/{1.73_m2} (ref >59)
GLOBULIN, TOTAL: 3.3 g/dL (ref 1.5–4.5)
Glucose: 92 mg/dL (ref 65–99)
POTASSIUM: 4.4 mmol/L (ref 3.5–5.2)
SODIUM: 141 mmol/L (ref 134–144)
TOTAL PROTEIN: 7.4 g/dL (ref 6.0–8.5)

## 2018-10-24 LAB — LIPID PANEL WITH LDL/HDL RATIO
Cholesterol, Total: 173 mg/dL (ref 100–199)
HDL: 76 mg/dL (ref >39)
LDL Calculated: 82 mg/dL (ref 0–99)
LDL/HDL RATIO: 1.1 ratio (ref 0.0–3.2)
Triglycerides: 73 mg/dL (ref 0–149)
VLDL Cholesterol Cal: 15 mg/dL (ref 5–40)

## 2018-10-24 LAB — TSH: TSH: 0.593 u[IU]/mL (ref 0.450–4.500)

## 2018-10-26 ENCOUNTER — Telehealth: Payer: Self-pay

## 2018-10-26 NOTE — Telephone Encounter (Signed)
-----   Message from Mar Daring, PA-C sent at 10/26/2018  8:02 AM EST ----- A1c down to 6.3 from 6.6! Keep up the good work. All other labs are all normal and stable.

## 2018-10-26 NOTE — Telephone Encounter (Signed)
Patient was advised.  

## 2019-02-18 ENCOUNTER — Other Ambulatory Visit: Payer: Self-pay

## 2019-02-18 ENCOUNTER — Ambulatory Visit (INDEPENDENT_AMBULATORY_CARE_PROVIDER_SITE_OTHER): Payer: BC Managed Care – PPO | Admitting: Physician Assistant

## 2019-02-18 ENCOUNTER — Encounter: Payer: Self-pay | Admitting: Physician Assistant

## 2019-02-18 VITALS — BP 121/75 | HR 97 | Temp 97.5°F | Resp 16 | Wt 255.6 lb

## 2019-02-18 DIAGNOSIS — Z4802 Encounter for removal of sutures: Secondary | ICD-10-CM

## 2019-02-18 NOTE — Progress Notes (Signed)
       Patient: Autumn Burton Female    DOB: May 11, 1956   63 y.o.   MRN: [id number] Visit Date: 02/18/2019  Today's Provider: Mar Daring, PA-C   Chief Complaint  Patient presents with  . Suture / Staple Removal   Subjective:     HPI  Patient here today with c/o having a suture in her lower back that was placed by Dermatology back in February and it has not dissolved.  No Known Allergies   Current Outpatient Medications:  .  aspirin EC 81 MG tablet, Take by mouth., Disp: , Rfl:  .  furosemide (LASIX) 20 MG tablet, Take 1 tablet (20 mg total) by mouth daily., Disp: 90 tablet, Rfl: 3 .  hydrochlorothiazide (HYDRODIURIL) 25 MG tablet, Take 1 tablet (25 mg total) by mouth daily., Disp: 90 tablet, Rfl: 3 .  metoprolol tartrate (LOPRESSOR) 25 MG tablet, Take 1 tablet (25 mg total) by mouth daily., Disp: 90 tablet, Rfl: 3 .  olmesartan (BENICAR) 20 MG tablet, Take 1 tablet (20 mg total) by mouth daily., Disp: 90 tablet, Rfl: 3 .  triamcinolone (KENALOG) 0.025 % ointment, APPLY TOPICALLY TWICE DAILY AS NEEDED FOR ECZEMA., Disp: 454 g, Rfl: 1  Review of Systems  Constitutional: Negative.   Respiratory: Negative.   Cardiovascular: Negative.   Skin:       Suture present  Neurological: Negative.     Social History   Tobacco Use  . Smoking status: Never Smoker  . Smokeless tobacco: Never Used  Substance Use Topics  . Alcohol use: No    Alcohol/week: 0.0 standard drinks      Objective:   BP 121/75 (BP Location: Left Arm, Patient Position: Sitting, Cuff Size: Large)   Pulse 97   Temp (!) 97.5 F (36.4 C) (Oral)   Resp 16   Wt 255 lb 9.6 oz (115.9 kg)   BMI 46.75 kg/m  Vitals:   02/18/19 1353  BP: 121/75  Pulse: 97  Resp: 16  Temp: (!) 97.5 F (36.4 C)  TempSrc: Oral  Weight: 255 lb 9.6 oz (115.9 kg)     Physical Exam Vitals signs reviewed.  Constitutional:      Appearance: Normal appearance. She is well-developed. She is obese. She is not  ill-appearing.  HENT:     Head: Normocephalic and atraumatic.  Neck:     Musculoskeletal: Normal range of motion and neck supple.  Pulmonary:     Effort: Pulmonary effort is normal. No respiratory distress.  Skin:    Comments: One suture present in low back with healed tissue overlying  Neurological:     Mental Status: She is alert.  Psychiatric:        Mood and Affect: Mood normal.        Behavior: Behavior normal.        Thought Content: Thought content normal.        Judgment: Judgment normal.         Assessment & Plan    1. Visit for suture removal Suture removed without issue. No steri strip needed due to well healed tissue.  - PR REMOVAL OF SUTURES     Mar Daring, PA-C  [facility]

## 2019-03-01 ENCOUNTER — Telehealth: Payer: Self-pay | Admitting: *Deleted

## 2019-03-01 DIAGNOSIS — B373 Candidiasis of vulva and vagina: Secondary | ICD-10-CM

## 2019-03-01 DIAGNOSIS — B3731 Acute candidiasis of vulva and vagina: Secondary | ICD-10-CM

## 2019-03-01 MED ORDER — TERCONAZOLE 0.8 % VA CREA: 1.0000 | TOPICAL_CREAM | Freq: Every day | VAGINAL | 0 refills | Status: DC

## 2019-03-01 NOTE — Telephone Encounter (Signed)
Terazol cream sent in for her to [facility]

## 2019-03-01 NOTE — Telephone Encounter (Signed)
Patient called office concerning vaginal itch she's had x3 weeks. Patient states she forgot to mention it at her ov 02/26/2019. Patient states she thinks she is having a reaction to a brand of toilet paper she hasn't used before. Patient wanted to know what she can do to help with itching. Patient states she only itches on the outside of vagina. Please advise?

## 2019-04-05 ENCOUNTER — Emergency Department
Admission: EM | Admit: 2019-04-05 | Discharge: 2019-04-05 | Disposition: A | Discharge status: Home / Self Care | Payer: BC Managed Care – PPO | Attending: Emergency Medicine | Admitting: Emergency Medicine

## 2019-04-05 ENCOUNTER — Other Ambulatory Visit: Payer: Self-pay

## 2019-04-05 ENCOUNTER — Emergency Department: Payer: BC Managed Care – PPO

## 2019-04-05 ENCOUNTER — Encounter: Payer: Self-pay | Admitting: Emergency Medicine

## 2019-04-05 DIAGNOSIS — Z7982 Long term (current) use of aspirin: Secondary | ICD-10-CM | POA: Insufficient documentation

## 2019-04-05 DIAGNOSIS — I10 Essential (primary) hypertension: Secondary | ICD-10-CM | POA: Insufficient documentation

## 2019-04-05 DIAGNOSIS — M549 Dorsalgia, unspecified: Secondary | ICD-10-CM

## 2019-04-05 DIAGNOSIS — M47816 Spondylosis without myelopathy or radiculopathy, lumbar region: Secondary | ICD-10-CM | POA: Diagnosis not present

## 2019-04-05 DIAGNOSIS — R1010 Upper abdominal pain, unspecified: Secondary | ICD-10-CM | POA: Diagnosis not present

## 2019-04-05 DIAGNOSIS — M62830 Muscle spasm of back: Secondary | ICD-10-CM | POA: Insufficient documentation

## 2019-04-05 DIAGNOSIS — M62838 Other muscle spasm: Secondary | ICD-10-CM

## 2019-04-05 DIAGNOSIS — Z79899 Other long term (current) drug therapy: Secondary | ICD-10-CM | POA: Insufficient documentation

## 2019-04-05 LAB — CBC
HCT: 39.7 % (ref 36.0–46.0)
Hemoglobin: 12.9 g/dL (ref 12.0–15.0)
MCH: 30.4 pg (ref 26.0–34.0)
MCHC: 32.5 g/dL (ref 30.0–36.0)
MCV: 93.4 fL (ref 80.0–100.0)
Platelets: 287 10*3/uL (ref 150–400)
RBC: 4.25 MIL/uL (ref 3.87–5.11)
RDW: 13.4 % (ref 11.5–15.5)
WBC: 8.3 10*3/uL (ref 4.0–10.5)
nRBC: 0 % (ref 0.0–0.2)

## 2019-04-05 LAB — URINALYSIS, COMPLETE (UACMP) WITH MICROSCOPIC
Bacteria, UA: NONE SEEN
Bilirubin Urine: NEGATIVE
Glucose, UA: NEGATIVE mg/dL
Hgb urine dipstick: NEGATIVE
Ketones, ur: NEGATIVE mg/dL
Nitrite: NEGATIVE
Protein, ur: NEGATIVE mg/dL
Specific Gravity, Urine: 1.016 (ref 1.005–1.030)
pH: 5 (ref 5.0–8.0)

## 2019-04-05 LAB — COMPREHENSIVE METABOLIC PANEL
ALT: 16 U/L (ref 0–44)
AST: 30 U/L (ref 15–41)
Albumin: 4.1 g/dL (ref 3.5–5.0)
Alkaline Phosphatase: 89 U/L (ref 38–126)
Anion gap: 11 (ref 5–15)
BUN: 25 mg/dL — ABNORMAL HIGH (ref 8–23)
CO2: 26 mmol/L (ref 22–32)
Calcium: 9.1 mg/dL (ref 8.9–10.3)
Chloride: 100 mmol/L (ref 98–111)
Creatinine, Ser: 0.92 mg/dL (ref 0.44–1.00)
GFR calc Af Amer: >60 mL/min (ref >60)
GFR calc non Af Amer: >60 mL/min (ref >60)
Glucose, Bld: 97 mg/dL (ref 70–99)
Potassium: 4.6 mmol/L (ref 3.5–5.1)
Sodium: 137 mmol/L (ref 135–145)
Total Bilirubin: 0.5 mg/dL (ref 0.3–1.2)
Total Protein: 9.1 g/dL — ABNORMAL HIGH (ref 6.5–8.1)

## 2019-04-05 MED ORDER — BACLOFEN 10 MG PO TABS: 10.0000 mg | ORAL_TABLET | Freq: Every day | ORAL | 0 refills | Status: DC

## 2019-04-05 MED ORDER — IBUPROFEN 600 MG PO TABS: 600.0000 mg | ORAL_TABLET | Freq: Four times a day (QID) | ORAL | 0 refills | Status: DC | PRN

## 2019-04-05 MED ORDER — TRAMADOL HCL 50 MG PO TABS: 50.0000 mg | ORAL_TABLET | Freq: Four times a day (QID) | ORAL | 0 refills | Status: DC | PRN

## 2019-04-05 MED ORDER — MORPHINE SULFATE (PF) 4 MG/ML IV SOLN
4.0000 mg | Freq: Once | INTRAVENOUS | Status: AC
  Administered 2019-04-05: 4 mg via INTRAVENOUS
  Filled 2019-04-05: qty 1

## 2019-04-05 MED ORDER — ONDANSETRON HCL 4 MG/2ML IJ SOLN
INTRAMUSCULAR | Status: AC
  Administered 2019-04-05: 14:00:00 4 mg
  Filled 2019-04-05: qty 2

## 2019-04-05 NOTE — ED Notes (Signed)
Patient returned from CT

## 2019-04-05 NOTE — ED Triage Notes (Signed)
Patient reports back spasms that started yesterday. Patient reports history of same. Patient tearful in triage. Denies known injury.

## 2019-04-05 NOTE — ED Notes (Signed)

## 2019-04-05 NOTE — Discharge Instructions (Signed)
Your x-ray shows some severe arthritis in your low back.  Please follow-up with Dr. Sharlet Salina with Ortho or Dr. Cari Caraway with neurosurgery.  Please call either of them in the morning for an appointment and also call your primary care provider.  You can take baclofen to help relax your muscles, ibuprofen for inflammation and tramadol for severe pain.  Please apply heat to your back.

## 2019-04-05 NOTE — Progress Notes (Signed)
Patient at this time no longer needs PIV started RN in the ED was able to get the patient an IV

## 2019-04-05 NOTE — ED Notes (Signed)
Patient transported to CT 

## 2019-04-05 NOTE — ED Provider Notes (Signed)
[facility] Emergency Department Provider Note  ____________________________________________  Time seen: Approximately 12:49 PM  I have reviewed the triage vital signs and the nursing notes.   HISTORY  Chief Complaint Back Pain    HPI Autumn Burton is a 63 y.o. female that presents to the emergency department for evaluation of left-sided mid back pain for 1 day.  Patient states that she feels like her back is spasming.  She has a sharp consistent pain to the left mid back.  Pain does not radiate.  No leg pain.  No numbness, tingling.  She has never had a kidney stone.  No recent illness.  No bowel or bladder dysfunction or saddle anesthesias.  No fever, nausea, vomiting, abdominal pain, urinary symptoms.  Past Medical History:  Diagnosis Date  . Hypertension     Patient Active Problem List   Diagnosis Date Noted  . Hx of colonic polyps   . Polyp of sigmoid colon   . Morbid obesity with BMI of 45.0-49.9, adult ([place]) 03/06/2016  . Chronic eczema 01/17/2015  . Cough due to ACE inhibitor 01/17/2015  . Essential (primary) hypertension 01/17/2015  . Secondary osteoarthritis of right ankle 01/17/2015  . Abscess or cellulitis of leg 11/25/2014  . Acquired lymphedema of leg 11/25/2014  . Infection and inflammatory reaction due to internal orthopedic device, implant, and graft ([place]) 05/01/2010  . Pseudoarthrosis of cervical spine ([place]) 01/12/2010  . Hyperthyroidism, subclinical 01/12/2010  . Tachycardia 01/12/2010  . Nonunion of fracture 01/12/2010    Past Surgical History:  Procedure Laterality Date  . COLONOSCOPY WITH PROPOFOL N/A 10/28/2016   Procedure: COLONOSCOPY WITH PROPOFOL;  Surgeon: Jonathon Bellows, MD;  Location: [facility] ENDOSCOPY;  Service: Endoscopy;  Laterality: N/A;  . DG RIGHT TIBIA AND FIBULA ([place] HX)    . LEG SURGERY Right     Prior to Admission medications   Medication Sig Start Date End Date Taking? Authorizing Provider  aspirin EC 81 MG  tablet Take by mouth.    [provider]  baclofen (LIORESAL) 10 MG tablet Take 1 tablet (10 mg total) by mouth daily. 04/05/19 04/04/20  Laban Emperor, PA-C  furosemide (LASIX) 20 MG tablet Take 1 tablet (20 mg total) by mouth daily. 09/04/16   Mar Daring, PA-C  hydrochlorothiazide (HYDRODIURIL) 25 MG tablet Take 1 tablet (25 mg total) by mouth daily. 08/12/18   Mar Daring, PA-C  ibuprofen (ADVIL) 600 MG tablet Take 1 tablet (600 mg total) by mouth every 6 (six) hours as needed. 04/05/19   Laban Emperor, PA-C  metoprolol tartrate (LOPRESSOR) 25 MG tablet Take 1 tablet (25 mg total) by mouth daily. 08/12/18   Mar Daring, PA-C  olmesartan (BENICAR) 20 MG tablet Take 1 tablet (20 mg total) by mouth daily. 08/12/18   Mar Daring, PA-C  terconazole (TERAZOL 3) 0.8 % vaginal cream Place 1 applicator vaginally at bedtime. 03/01/19   Mar Daring, PA-C  traMADol (ULTRAM) 50 MG tablet Take 1 tablet (50 mg total) by mouth every 6 (six) hours as needed. 04/05/19 04/04/20  Laban Emperor, PA-C  triamcinolone (KENALOG) 0.025 % ointment APPLY TOPICALLY TWICE DAILY AS NEEDED FOR ECZEMA. 11/05/17   Mar Daring, PA-C    Allergies Patient has no known allergies.  Family History  Problem Relation Age of Onset  . Diabetes Mother   . COPD Mother   . Heart disease Mother   . Healthy Sister   . Healthy Son     Social  History Social History   Tobacco Use  . Smoking status: Never Smoker  . Smokeless tobacco: Never Used  Substance Use Topics  . Alcohol use: No    Alcohol/week: 0.0 standard drinks  . Drug use: No     Review of Systems  Constitutional: No fever/chills Cardiovascular: No chest pain. Respiratory: No SOB. Gastrointestinal: No abdominal pain.  No nausea, no vomiting.  Musculoskeletal: Positive for back pain. Skin: Negative for rash, abrasions, lacerations, ecchymosis. Neurological: Negative for headaches, numbness or  tingling   ____________________________________________   PHYSICAL EXAM:  VITAL SIGNS: ED Triage Vitals  Enc Vitals Group     BP 04/05/19 1202 (!) 181/79     Pulse Rate 04/05/19 1202 100     Resp 04/05/19 1202 16     Temp 04/05/19 1202 98 F (36.7 C)     Temp Source 04/05/19 1202 Oral     SpO2 04/05/19 1202 100 %     Weight 04/05/19 1203 240 lb (108.9 kg)     Height 04/05/19 1203 5\' 5"  (1.651 m)     Head Circumference --      Peak Flow --      Pain Score 04/05/19 1203 7     Pain Loc --      Pain Edu? --      Excl. in [place]? --      Constitutional: Alert and oriented. Well appearing and in no acute distress. Eyes: Conjunctivae are normal. PERRL. EOMI. Head: Atraumatic. ENT:      Ears:      Nose: No congestion/rhinnorhea.      Mouth/Throat: Mucous membranes are moist.  Neck: No stridor. Cardiovascular: Normal rate, regular rhythm.  Good peripheral circulation. Respiratory: Normal respiratory effort without tachypnea or retractions. Lungs CTAB. Good air entry to the bases with no decreased or absent breath sounds. Gastrointestinal: Bowel sounds 4 quadrants. Soft and nontender to palpation. No guarding or rigidity. No palpable masses. No distention.  Musculoskeletal: Full range of motion to all extremities. No gross deformities appreciated.  Tenderness to palpation to left mid back along L1-L2 distribution.  Strength equal in lower extremities bilaterally.  Normal but slow gait. Neurologic:  Normal speech and language. No gross focal neurologic deficits are appreciated.  Skin:  Skin is warm, dry and intact. No rash noted. Psychiatric: Mood and affect are normal. Speech and behavior are normal. Patient exhibits appropriate insight and judgement.   ____________________________________________   LABS (all labs ordered are listed, but only abnormal results are displayed)  Labs Reviewed  COMPREHENSIVE METABOLIC PANEL - Abnormal; Notable for the following components:       Result Value   BUN 25 (*)    Total Protein 9.1 (*)    All other components within normal limits  URINALYSIS, COMPLETE (UACMP) WITH MICROSCOPIC - Abnormal; Notable for the following components:   Color, Urine YELLOW (*)    APPearance CLEAR (*)    Leukocytes,Ua SMALL (*)    All other components within normal limits  CBC   ____________________________________________  EKG   ____________________________________________  RADIOLOGY Robinette Haines, personally viewed and evaluated these images (plain radiographs) as part of my medical decision making, as well as reviewing the written report by the radiologist.  Ct L-spine No Charge  Result Date: 04/05/2019 CLINICAL DATA:  Back pain. EXAM: CT LUMBAR SPINE WITHOUT CONTRAST TECHNIQUE: Multidetector CT imaging of the lumbar spine was performed without intravenous contrast administration. Multiplanar CT image reconstructions were also generated. COMPARISON:  None. FINDINGS: Segmentation: 5  lumbar type vertebrae. Mild enlargement of the right L5 transverse process with pseudoarticulation with the sacrum. Alignment: Facet mediated anterolisthesis of L4 on L5 measuring 3 mm. Vertebrae: Chronic deformities of the right iliac wing and right twelfth rib which may be related to remote trauma. No acute fracture or destructive osseous lesion. Paraspinal and other soft tissues: Intra-abdominal and pelvic contents reported separately. No acute paraspinal soft tissue abnormality identified. Disc levels: L1-2: Mild facet arthrosis without stenosis. L2-3: Minimal disc bulging and moderate facet arthrosis without evidence of significant stenosis. L3-4: Circumferential disc bulging and severe facet arthrosis result in mild spinal stenosis and moderate right greater than left neural foraminal stenosis. L4-5: Moderate disc space narrowing. Anterolisthesis with disc uncovering, endplate spurring, and severe facet arthrosis result in mild right and moderate to severe left  neural foraminal stenosis with potential left L4 nerve root impingement. No spinal stenosis. Bilateral facet ankylosis. L5-S1: Mild disc space narrowing. Mild disc bulging greater to the left and moderate facet arthrosis result in moderate left neural foraminal stenosis without spinal stenosis. IMPRESSION: 1. No acute osseous abnormality identified. 2. Severe L4-5 facet arthrosis with grade 1 anterolisthesis and moderate to severe left neural foraminal stenosis. 3. Mild spinal and moderate right greater than left foraminal stenosis at L3-4. 4. Moderate left neural foraminal stenosis at L5-S1. Electronically Signed   By: Logan Bores M.D.   On: 04/05/2019 14:59   Ct Renal Stone Study  Result Date: 04/05/2019 CLINICAL DATA:  Flank pain. Kidney stone disease suspected. EXAM: CT ABDOMEN AND PELVIS WITHOUT CONTRAST TECHNIQUE: Multidetector CT imaging of the abdomen and pelvis was performed following the standard protocol without IV contrast. COMPARISON:  None. FINDINGS: Lower chest: Unremarkable. Hepatobiliary: No focal abnormality in the liver on this study without intravenous contrast. There is no evidence for gallstones, gallbladder wall thickening, or pericholecystic fluid. No intrahepatic or extrahepatic biliary dilation. Pancreas: No focal mass lesion. No dilatation of the main duct. No intraparenchymal cyst. No peripancreatic edema. Spleen: No splenomegaly. No focal mass lesion. Adrenals/Urinary Tract: No adrenal nodule or mass. No stones identified in either kidney or ureter. No bladder stones. Stomach/Bowel: Tiny hiatal hernia. Stomach otherwise unremarkable. Duodenum is normally positioned as is the ligament of Treitz. No small bowel wall thickening. No small bowel dilatation. The terminal ileum is normal. Small appendicoliths noted in the base of the appendix without appendiceal dilatation or periappendiceal edema/inflammation. No gross colonic mass. No colonic wall thickening. Vascular/Lymphatic: No  abdominal aortic aneurysm. No abdominal aortic atherosclerotic calcification. There is no gastrohepatic or hepatoduodenal ligament lymphadenopathy. No intraperitoneal or retroperitoneal lymphadenopathy. 9 mm short axis right common femoral lymph node is upper limits of normal for size. No left pelvic sidewall lymphadenopathy. Reproductive: Calcified fibroids noted in the uterus. There is no adnexal mass. Other: No intraperitoneal free fluid. Musculoskeletal: Small bilateral groin hernias contain only fat. No worrisome lytic or sclerotic osseous abnormality. IMPRESSION: No acute findings in the abdomen or pelvis. Specifically, no findings to explain the patient's history of left flank pain. Electronically Signed   By: Misty Stanley M.D.   On: 04/05/2019 14:55    ____________________________________________    PROCEDURES  Procedure(s) performed:    Procedures    Medications  morphine 4 MG/ML injection 4 mg (4 mg Intravenous Given 04/05/19 1417)  ondansetron (ZOFRAN) 4 MG/2ML injection (4 mg  Given 04/05/19 1414)     ____________________________________________   INITIAL IMPRESSION / ASSESSMENT AND PLAN / ED COURSE  Pertinent labs & imaging results that were available during  my care of the patient were reviewed by me and considered in my medical decision making (see chart for details).  Review of the [place] CSRS was performed in accordance of the [place] prior to dispensing any controlled drugs.     Patient presents emergency department for evaluation of mid back pain for 1 day.  Vital signs and exam are reassuring.  CT consistent with osteoarthritis between L3 and S1.  Patient's pain is primarily in the L1-L2 distribution.  CT negative for nephrolithiasis.  Urinalysis noncontributory for infection.  Pain resolved with morphine.  Patient is up walking around in the emergency department without assistance.  No bowel or bladder dysfunction or saddle anesthesias.  Patient will be discharged home  with prescriptions for baclofen, Advil, tramadol. Patient is to follow up with neurosurgery or orthopedics as directed.  Referrals were given to Dr. Cari Caraway Dr. Sharlet Salina.  Patient is given ED precautions to return to the ED for any worsening or new symptoms.     ____________________________________________  FINAL CLINICAL IMPRESSION(S) / ED DIAGNOSES  Final diagnoses:  Back pain  Muscle spasm  Spondylosis of lumbar region without myelopathy or radiculopathy      NEW MEDICATIONS STARTED DURING THIS VISIT:  ED Discharge Orders         Ordered    baclofen (LIORESAL) 10 MG tablet  Daily     04/05/19 1529    ibuprofen (ADVIL) 600 MG tablet  Every 6 hours PRN     04/05/19 1529    traMADol (ULTRAM) 50 MG tablet  Every 6 hours PRN     04/05/19 1529              This chart was dictated using voice recognition software/Dragon. Despite best efforts to proofread, errors can occur which can change the meaning. Any change was purely unintentional.    Laban Emperor, PA-C 04/05/19 1604    Delman Kitten, MD 04/06/19 1630

## 2019-04-06 LAB — URINE CULTURE

## 2019-06-01 ENCOUNTER — Encounter: Payer: Self-pay | Admitting: Physician Assistant

## 2019-06-01 ENCOUNTER — Ambulatory Visit: Payer: BC Managed Care – PPO | Admitting: Physician Assistant

## 2019-06-01 ENCOUNTER — Other Ambulatory Visit: Payer: Self-pay

## 2019-06-01 VITALS — BP 122/64 | HR 100 | Temp 97.9°F | Wt 256.0 lb

## 2019-06-01 DIAGNOSIS — L0501 Pilonidal cyst with abscess: Secondary | ICD-10-CM | POA: Diagnosis not present

## 2019-06-01 MED ORDER — DOXYCYCLINE HYCLATE 100 MG PO TABS: 100.0000 mg | ORAL_TABLET | Freq: Two times a day (BID) | ORAL | 0 refills | Status: DC

## 2019-06-01 NOTE — Progress Notes (Signed)
TARANEH METHENEY  MRN: [id number] DOB: [birth year]  Subjective:  HPI   The patient is a 63 year old female who presents for evaluation of an abscess of the buttocks.  She states she first noticed it 2 weeks. She had itching and draining of the site for 2 weeks. Has been applying warm compresses without resolution. Does help to relieve the pain/pressure some.  Patient Active Problem List   Diagnosis Date Noted  . Hx of colonic polyps   . Polyp of sigmoid colon   . Morbid obesity with BMI of 45.0-49.9, adult ([place]) 03/06/2016  . Chronic eczema 01/17/2015  . Cough due to ACE inhibitor 01/17/2015  . Essential (primary) hypertension 01/17/2015  . Secondary osteoarthritis of right ankle 01/17/2015  . Abscess or cellulitis of leg 11/25/2014  . Acquired lymphedema of leg 11/25/2014  . Infection and inflammatory reaction due to internal orthopedic device, implant, and graft ([place]) 05/01/2010  . Pseudoarthrosis of cervical spine ([place]) 01/12/2010  . Hyperthyroidism, subclinical 01/12/2010  . Tachycardia 01/12/2010  . Nonunion of fracture 01/12/2010    Past Medical History:  Diagnosis Date  . Hypertension     Social History   Socioeconomic History  . Marital status: Married    Spouse name: Not on file  . Number of children: 1  . Years of education: Not on file  . Highest education level: Not on file  Occupational History  . Not on file  Social Needs  . Financial resource strain: Not on file  . Food insecurity    Worry: Not on file    Inability: Not on file  . Transportation needs    Medical: Not on file    Non-medical: Not on file  Tobacco Use  . Smoking status: Never Smoker  . Smokeless tobacco: Never Used  Substance and Sexual Activity  . Alcohol use: No    Alcohol/week: 0.0 standard drinks  . Drug use: No  . Sexual activity: Not on file  Lifestyle  . Physical activity    Days per week: Not on file    Minutes per session: Not on file  . Stress: Not on file   Relationships  . Social Herbalist on phone: Not on file    Gets together: Not on file    Attends religious service: Not on file    Active member of club or organization: Not on file    Attends meetings of clubs or organizations: Not on file    Relationship status: Not on file  . Intimate partner violence    Fear of current or ex partner: Not on file    Emotionally abused: Not on file    Physically abused: Not on file    Forced sexual activity: Not on file  Other Topics Concern  . Not on file  Social History Narrative  . Not on file    Outpatient Encounter Medications as of 06/01/2019  Medication Sig Note  . aspirin EC 81 MG tablet Take by mouth. 09/08/2015: Received from: [facility]  . baclofen (LIORESAL) 10 MG tablet Take 1 tablet (10 mg total) by mouth daily.   . furosemide (LASIX) 20 MG tablet Take 1 tablet (20 mg total) by mouth daily.   . hydrochlorothiazide (HYDRODIURIL) 25 MG tablet Take 1 tablet (25 mg total) by mouth daily.   Marland Kitchen ibuprofen (ADVIL) 600 MG tablet Take 1 tablet (600 mg total) by mouth every 6 (six) hours as needed.   . metoprolol  tartrate (LOPRESSOR) 25 MG tablet Take 1 tablet (25 mg total) by mouth daily.   Marland Kitchen olmesartan (BENICAR) 20 MG tablet Take 1 tablet (20 mg total) by mouth daily.   Marland Kitchen triamcinolone (KENALOG) 0.025 % ointment APPLY TOPICALLY TWICE DAILY AS NEEDED FOR ECZEMA.   Marland Kitchen terconazole (TERAZOL 3) 0.8 % vaginal cream Place 1 applicator vaginally at bedtime. (Patient not taking: Reported on 06/01/2019)   . traMADol (ULTRAM) 50 MG tablet Take 1 tablet (50 mg total) by mouth every 6 (six) hours as needed. (Patient not taking: Reported on 06/01/2019)    No facility-administered encounter medications on file as of 06/01/2019.     No Known Allergies  Review of Systems  Constitutional: Negative for fever.  Respiratory: Negative.   Cardiovascular: Negative.   Skin: Positive for itching (at site).       Wound/abscess   Neurological: Negative.     Objective:  BP 122/64 (BP Location: Right Arm, Patient Position: Sitting, Cuff Size: Normal)   Pulse 100   Temp 97.9 F (36.6 C) (Oral)   Wt 256 lb (116.1 kg)   SpO2 97%   BMI 42.60 kg/m   Physical Exam  Constitutional: She is well-developed, well-nourished, and in no distress. No distress.  HENT:  Head: Normocephalic and atraumatic.  Eyes: EOM are normal. No scleral icterus.  Neck: Normal range of motion. Neck supple.  Skin: Lesion noted. She is not diaphoretic.     Vitals reviewed.   Assessment and Plan :   1. Pilonidal abscess Symptoms c/w infected pilonidal cyst/abscess. Since the area is already autodraining, I&D was not performed. The lesion was expressed to get as much drainage out as possible. Will treat with doxycycline as below. Continue warm compresses. Call if not improving or if returns once antibiotic completed. May refer to gen surgery for consideration of removal if continues.  - doxycycline (VIBRA-TABS) 100 MG tablet; Take 1 tablet (100 mg total) by mouth 2 (two) times daily.  Dispense: 20 tablet; Refill: 0

## 2019-06-01 NOTE — Patient Instructions (Signed)
Pilonidal Cyst  A pilonidal cyst is a fluid-filled sac that forms beneath the skin near the tailbone, at the top of the crease of the buttocks (pilonidal area). If the cyst is not large and not infected, it may not cause any problems. If the cyst becomes irritated or infected, it may get larger and fill with pus. An infected cyst is called an abscess. A pilonidal abscess may cause pain and swelling, and it may need to be drained or removed. What are the causes? The cause of this condition is not always known. In some cases, a hair that grows into your skin (ingrown hair) may be the cause. What increases the risk? You are more likely to get a pilonidal cyst if you:  Are female.  Have lots of hair near the crease of the buttocks.  Are overweight.  Have a dimple near the crease of the buttocks.  Wear tight clothing.  Do not bathe or shower often.  Sit for long periods of time. What are the signs or symptoms? Signs and symptoms of a pilonidal cyst may include pain, swelling, redness, and warmth in the pilonidal area. Depending on how big the cyst is, you may be able to feel a lump near your tailbone. If your cyst becomes infected, symptoms may include:  Pus or fluid drainage.  Fever.  Pain, swelling, and redness getting worse.  The lump getting bigger. How is this diagnosed? This condition may be diagnosed based on:  Your symptoms and medical history.  A physical exam.  A blood test to check for infection.  Testing a pus sample, if applicable. How is this treated? If your cyst does not cause symptoms, you may not need any treatment. If your cyst bothers you or is infected, you may need a procedure to drain or remove the cyst. Depending on the size, location, and severity of your cyst, your health care provider may:  Make an incision in the cyst and drain it (incision and drainage).  Open and drain the cyst, and then stitch the wound so that it stays open while it heals  (marsupialization). You will be given instructions about how to care for your open wound while it heals.  Remove all or part of the cyst, and then close the wound (cyst removal). You may need to take antibiotic medicines before your procedure. Follow these instructions at home: Medicines  Take over-the-counter and prescription medicines only as told by your health care provider.  If you were prescribed an antibiotic medicine, take it as told by your health care provider. Do not stop taking the antibiotic even if you start to feel better. General instructions  Keep the area around your pilonidal cyst clean and dry.  If there is fluid or pus draining from your cyst: ? Cover the area with a clean bandage (dressing) as needed. ? Wash the area gently with soap and water. Pat the area dry with a clean towel. Do not rub the area because that may cause bleeding.  Remove hair from the area around the cyst only if your health care provider tells you to do this.  Do not wear tight pants or sit in one position for long periods at a time.  Keep all follow-up visits as told by your health care provider. This is important. Contact a health care provider if you have:  New redness, swelling, or pain.  A fever.  Severe pain. Summary  A pilonidal cyst is a fluid-filled sac that forms beneath the   skin near the tailbone, at the top of the crease of the buttocks (pilonidal area).  If the cyst becomes irritated or infected, it may get larger and fill with pus. An infected cyst is called an abscess.  The cause of this condition is not always known. In some cases, a hair that grows into your skin (ingrown hair) may be the cause.  If your cyst does not cause symptoms, you may not need any treatment. If your cyst bothers you or is infected, you may need a procedure to drain or remove the cyst. This information is not intended to replace advice given to you by your health care provider. Make sure you  discuss any questions you have with your health care provider. Document Released: 10/25/2000 Document Revised: 10/16/2017 Document Reviewed: 10/16/2017 Elsevier Patient Education  [place].

## 2019-09-21 ENCOUNTER — Other Ambulatory Visit: Payer: Self-pay

## 2019-09-21 ENCOUNTER — Ambulatory Visit: Payer: BC Managed Care – PPO | Admitting: Physician Assistant

## 2019-09-21 ENCOUNTER — Encounter: Payer: Self-pay | Admitting: Physician Assistant

## 2019-09-21 VITALS — BP 123/82 | HR 104 | Temp 96.9°F | Wt 253.0 lb

## 2019-09-21 DIAGNOSIS — L309 Dermatitis, unspecified: Secondary | ICD-10-CM

## 2019-09-21 DIAGNOSIS — R21 Rash and other nonspecific skin eruption: Secondary | ICD-10-CM | POA: Diagnosis not present

## 2019-09-21 MED ORDER — DESONIDE 0.05 % EX CREA: TOPICAL_CREAM | Freq: Two times a day (BID) | CUTANEOUS | 0 refills | Status: DC

## 2019-09-21 NOTE — Patient Instructions (Addendum)

## 2019-09-21 NOTE — Progress Notes (Signed)
Patient: Autumn Burton Female    DOB: 30-Oct-1956   63 y.o.   MRN: [id number] Visit Date: 09/21/2019  Today's Provider: Trinna Post, PA-C   Chief Complaint  Patient presents with  . Rash   Subjective:   Patient is a 63 y/o woman with severe eczema previously on dupixent  is presenting today for a vaginal rash. In April she called her PCP after a visit stating she forgot to mention vaginal itching and rash which she believed was due to her toilet paper. She was sent terconazole cream which she reports using but has not helped.   She is followed by [facility] dermatology and has an upcoming appointment with them on 10/14/2019. She reports she is taking methotrexate but most recent dermatology visit on 09/02/2019 shows instructions to start taking Cellcept.   Rash This is a recurrent problem. The current episode started more than 1 month ago. The problem has been gradually worsening since onset. The affected locations include the genitalia. The rash is characterized by itchiness and pain. Past treatments include anti-itch cream. The treatment provided mild relief.     No Known Allergies   Current Outpatient Medications:  .  aspirin EC 81 MG tablet, Take by mouth., Disp: , Rfl:  .  baclofen (LIORESAL) 10 MG tablet, Take 1 tablet (10 mg total) by mouth daily., Disp: 20 tablet, Rfl: 0 .  furosemide (LASIX) 20 MG tablet, Take 1 tablet (20 mg total) by mouth daily., Disp: 90 tablet, Rfl: 3 .  hydrochlorothiazide (HYDRODIURIL) 25 MG tablet, Take 1 tablet (25 mg total) by mouth daily., Disp: 90 tablet, Rfl: 3 .  ibuprofen (ADVIL) 600 MG tablet, Take 1 tablet (600 mg total) by mouth every 6 (six) hours as needed., Disp: 20 tablet, Rfl: 0 .  metoprolol tartrate (LOPRESSOR) 25 MG tablet, Take 1 tablet (25 mg total) by mouth daily., Disp: 90 tablet, Rfl: 3 .  olmesartan (BENICAR) 20 MG tablet, Take 1 tablet (20 mg total) by mouth daily., Disp: 90 tablet, Rfl: 3 .  terconazole (TERAZOL 3)  0.8 % vaginal cream, Place 1 applicator vaginally at bedtime., Disp: 20 g, Rfl: 0 .  traMADol (ULTRAM) 50 MG tablet, Take 1 tablet (50 mg total) by mouth every 6 (six) hours as needed., Disp: 8 tablet, Rfl: 0 .  triamcinolone (KENALOG) 0.025 % ointment, APPLY TOPICALLY TWICE DAILY AS NEEDED FOR ECZEMA., Disp: 454 g, Rfl: 1 .  doxycycline (VIBRA-TABS) 100 MG tablet, Take 1 tablet (100 mg total) by mouth 2 (two) times daily. (Patient not taking: Reported on 09/21/2019), Disp: 20 tablet, Rfl: 0  Review of Systems  Constitutional: Negative.   Respiratory: Negative.   Genitourinary: Negative.   Skin: Positive for rash.  Neurological: Negative.     Social History   Tobacco Use  . Smoking status: Never Smoker  . Smokeless tobacco: Never Used  Substance Use Topics  . Alcohol use: No    Alcohol/week: 0.0 standard drinks      Objective:   BP 123/82 (BP Location: Left Arm, Patient Position: Sitting, Cuff Size: Large)   Pulse (!) 104   Temp (!) 96.9 F (36.1 C) (Temporal)   Wt 253 lb (114.8 kg)   BMI 42.10 kg/m  Vitals:   09/21/19 1134  BP: 123/82  Pulse: (!) 104  Temp: (!) 96.9 F (36.1 C)  TempSrc: Temporal  Weight: 253 lb (114.8 kg)  Body mass index is 42.1 kg/m.   Physical Exam Constitutional:  Appearance: Normal appearance. She is obese.  Genitourinary:    Comments: Evidence of excoriations on labia majora but no overt erythema or papules.  Musculoskeletal:     Right lower leg: Edema present.     Left lower leg: Edema present.  Skin:    Findings: Erythema and rash present.          Comments: Lichenified plaques diffusely on body.   Neurological:     Mental Status: She is alert and oriented to person, place, and time. Mental status is at baseline.  Psychiatric:        Mood and Affect: Mood normal.        Behavior: Behavior normal.      No results found for any visits on 09/21/19.     Assessment & Plan    1. Chronic eczema  I think this is an  extension of her eczema rather than a yeast infection. Will give steroid cream as below, may need higher potency. Advised to bring this up with dermatologist at next visit.   - desonide (DESOWEN) 0.05 % cream; Apply topically 2 (two) times daily.  Dispense: 30 g; Refill: 0  2. Rash of genital area  The entirety of the information documented in the History of Present Illness, Review of Systems and Physical Exam were personally obtained by me. Portions of this information were initially documented by [facility], CMA and reviewed by me for thoroughness and accuracy.           Trinna Post, PA-C  [facility]

## 2019-10-05 ENCOUNTER — Other Ambulatory Visit: Payer: Self-pay | Admitting: Physician Assistant

## 2019-10-05 DIAGNOSIS — I10 Essential (primary) hypertension: Secondary | ICD-10-CM

## 2019-10-22 ENCOUNTER — Ambulatory Visit (INDEPENDENT_AMBULATORY_CARE_PROVIDER_SITE_OTHER): Payer: BC Managed Care – PPO | Admitting: Family Medicine

## 2019-10-22 ENCOUNTER — Other Ambulatory Visit: Payer: Self-pay

## 2019-10-22 ENCOUNTER — Encounter: Payer: Self-pay | Admitting: Family Medicine

## 2019-10-22 VITALS — BP 113/70 | HR 91 | Temp 97.3°F | Resp 18 | Ht 65.0 in | Wt 249.8 lb

## 2019-10-22 DIAGNOSIS — Z23 Encounter for immunization: Secondary | ICD-10-CM | POA: Diagnosis not present
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
## 2020-01-16 ENCOUNTER — Other Ambulatory Visit: Payer: Self-pay | Admitting: Physician Assistant

## 2020-01-16 DIAGNOSIS — I10 Essential (primary) hypertension: Secondary | ICD-10-CM

## 2020-01-17 ENCOUNTER — Other Ambulatory Visit: Payer: Self-pay | Admitting: Physician Assistant

## 2020-01-17 ENCOUNTER — Telehealth: Payer: Self-pay | Admitting: Physician Assistant

## 2020-01-17 DIAGNOSIS — I10 Essential (primary) hypertension: Secondary | ICD-10-CM

## 2020-01-21 ENCOUNTER — Encounter: Payer: Self-pay | Admitting: Physician Assistant

## 2020-01-21 ENCOUNTER — Ambulatory Visit (INDEPENDENT_AMBULATORY_CARE_PROVIDER_SITE_OTHER): Payer: BC Managed Care – PPO | Admitting: Physician Assistant

## 2020-01-21 ENCOUNTER — Other Ambulatory Visit: Payer: Self-pay

## 2020-01-21 VITALS — BP 157/90 | HR 85 | Temp 96.2°F | Wt 265.0 lb

## 2020-01-21 DIAGNOSIS — H5789 Other specified disorders of eye and adnexa: Secondary | ICD-10-CM

## 2020-01-21 NOTE — Progress Notes (Signed)
Patient: Autumn Burton Female    DOB: 03/14/1956   64 y.o.   MRN: [id number] Visit Date: 01/24/2020  Today's Provider: Trinna Post, PA-C   Chief Complaint  Patient presents with  . Eye Pain   Subjective:     Eye Pain  The left eye is affected. This is a new problem. The current episode started yesterday. The problem occurs constantly. There was no injury mechanism. The pain is moderate. There is no known exposure to pink eye. She does not wear contacts. Associated symptoms include photophobia. Pertinent negatives include no eye discharge or fever.   The patient reports she is having extreme left eye pain since yesterday. She reports it occurred suddenly. She reports that she was sitting outside in the sun and thinks this may have caused it. She did not have an injury. She does not woodwork or weld. She reports her vision has not changed. She reports extreme photophobia. She denies issues with her left eye. She does not wear contacts.  Allergies  Allergen Reactions  . Latex Itching and Rash     Current Outpatient Medications:  .  aspirin EC 81 MG tablet, Take by mouth., Disp: , Rfl:  .  desonide (DESOWEN) 0.05 % cream, Apply topically 2 (two) times daily., Disp: 30 g, Rfl: 0 .  furosemide (LASIX) 20 MG tablet, Take 1 tablet (20 mg total) by mouth daily., Disp: 90 tablet, Rfl: 3 .  gabapentin (NEURONTIN) 100 MG capsule, Take 100 mg by mouth 3 (three) times daily., Disp: , Rfl:  .  hydrochlorothiazide (HYDRODIURIL) 25 MG tablet, Take 1 tablet by mouth once daily, Disp: 90 tablet, Rfl: 0 .  ibuprofen (ADVIL) 600 MG tablet, Take 1 tablet (600 mg total) by mouth every 6 (six) hours as needed., Disp: 20 tablet, Rfl: 0 .  metoprolol tartrate (LOPRESSOR) 25 MG tablet, Take 1 tablet by mouth once daily, Disp: 90 tablet, Rfl: 0 .  olmesartan (BENICAR) 20 MG tablet, Take 1 tablet by mouth once daily, Disp: 90 tablet, Rfl: 0 .  terconazole (TERAZOL 3) 0.8 % vaginal cream, Place 1  applicator vaginally at bedtime., Disp: 20 g, Rfl: 0 .  triamcinolone (KENALOG) 0.025 % ointment, APPLY TOPICALLY TWICE DAILY AS NEEDED FOR ECZEMA., Disp: 454 g, Rfl: 1 .  traMADol (ULTRAM) 50 MG tablet, Take 1 tablet (50 mg total) by mouth every 6 (six) hours as needed. (Patient not taking: Reported on 01/21/2020), Disp: 8 tablet, Rfl: 0  Review of Systems  Constitutional: Negative for fever.  Eyes: Positive for photophobia and pain. Negative for discharge.    Social History   Tobacco Use  . Smoking status: Never Smoker  . Smokeless tobacco: Never Used  Substance Use Topics  . Alcohol use: No    Alcohol/week: 0.0 standard drinks      Objective:   BP (!) 157/90 (BP Location: Left Arm, Patient Position: Sitting, Cuff Size: Large)   Pulse 85   Temp (!) 96.2 F (35.7 C) (Temporal)   Wt 265 lb (120.2 kg)   BMI 44.10 kg/m  Vitals:   01/21/20 1512  BP: (!) 157/90  Pulse: 85  Temp: (!) 96.2 F (35.7 C)  TempSrc: Temporal  Weight: 265 lb (120.2 kg)  Body mass index is 44.1 kg/m.   Physical Exam Constitutional:      Appearance: Normal appearance. She is not ill-appearing.  Eyes:     General: Lids are normal. Lids are everted, no foreign bodies appreciated.  Vision grossly intact. Gaze aligned appropriately.        Right eye: Discharge present. No foreign body or hordeolum.        Left eye: No foreign body, discharge or hordeolum.     Extraocular Movements: Extraocular movements intact.     Conjunctiva/sclera:     Right eye: Right conjunctiva is injected. No exudate or hemorrhage.    Left eye: Left conjunctiva is not injected. No exudate or hemorrhage.    Comments: Patient is wearing sunglasses in the office. Patient demonstrates pain when both pupils are examined, however she demonstrates more pain when examining her left eye.   Neurological:     Mental Status: She is alert.      No results found for any visits on 01/21/20.     Assessment & Plan    1. Red  eye  Patient extremely photophobic with injected left eye and vision in tact. Concerned for scleritis, iritis and advised patient she needs an urgent eye referral. I have called over to [facility] and they have advised her to come right over and she will have an appointment with Dr. Michelene Heady. Patient has been advised and is agreeable with plan.   Spoke with patient at the time of writing note and she has indeed been diagnosed with iritis. She reports significant improvement after starting steroid eye drops. She has scheduled a follow up with [facility].   The entirety of the information documented in the History of Present Illness, Review of Systems and Physical Exam were personally obtained by me. Portions of this information were initially documented by Elonda Husky, CMA and reviewed by me for thoroughness and accuracy.   F/u PRN  I have spent 25 minutes with this patient, >50% of which was spent on counseling and coordination of care.        Trinna Post, PA-C  [facility]

## 2020-02-04 ENCOUNTER — Other Ambulatory Visit: Payer: Self-pay | Admitting: Ophthalmology

## 2020-02-04 ENCOUNTER — Ambulatory Visit
Admission: RE | Admit: 2020-02-04 | Discharge: 2020-02-04 | Disposition: A | Discharge status: Home / Self Care | Payer: BC Managed Care – PPO | Source: Ambulatory Visit | Attending: Ophthalmology | Admitting: Ophthalmology

## 2020-02-04 ENCOUNTER — Other Ambulatory Visit
Admission: RE | Admit: 2020-02-04 | Discharge: 2020-02-04 | Disposition: A | Discharge status: Home / Self Care | Payer: BC Managed Care – PPO | Source: Ambulatory Visit | Attending: Ophthalmology | Admitting: Ophthalmology

## 2020-02-04 DIAGNOSIS — H2013 Chronic iridocyclitis, bilateral: Secondary | ICD-10-CM | POA: Insufficient documentation

## 2020-02-04 LAB — CBC WITH DIFFERENTIAL/PLATELET
Abs Immature Granulocytes: 0.02 10*3/uL (ref 0.00–0.07)
Basophils Absolute: 0 10*3/uL (ref 0.0–0.1)
Basophils Relative: 0 %
Eosinophils Absolute: 0.2 10*3/uL (ref 0.0–0.5)
Eosinophils Relative: 3 %
HCT: 35.7 % — ABNORMAL LOW (ref 36.0–46.0)
Hemoglobin: 11.4 g/dL — ABNORMAL LOW (ref 12.0–15.0)
Immature Granulocytes: 0 %
Lymphocytes Relative: 27 %
Lymphs Abs: 1.6 10*3/uL (ref 0.7–4.0)
MCH: 29.2 pg (ref 26.0–34.0)
MCHC: 31.9 g/dL (ref 30.0–36.0)
MCV: 91.3 fL (ref 80.0–100.0)
Monocytes Absolute: 0.4 10*3/uL (ref 0.1–1.0)
Monocytes Relative: 6 %
Neutro Abs: 3.6 10*3/uL (ref 1.7–7.7)
Neutrophils Relative %: 64 %
Platelets: 234 10*3/uL (ref 150–400)
RBC: 3.91 MIL/uL (ref 3.87–5.11)
RDW: 14.4 % (ref 11.5–15.5)
WBC: 5.7 10*3/uL (ref 4.0–10.5)
nRBC: 0 % (ref 0.0–0.2)

## 2020-02-04 LAB — SEDIMENTATION RATE: Sed Rate: 65 mm/hr — ABNORMAL HIGH (ref 0–30)

## 2020-02-05 LAB — ANGIOTENSIN CONVERTING ENZYME: Angiotensin-Converting Enzyme: 44 U/L (ref 14–82)

## 2020-02-07 LAB — QUANTIFERON-TB GOLD PLUS: QuantiFERON-TB Gold Plus: NEGATIVE

## 2020-02-07 LAB — QUANTIFERON-TB GOLD PLUS (RQFGPL)
QuantiFERON Mitogen Value: 8.69 IU/mL
QuantiFERON Nil Value: 0.02 IU/mL
QuantiFERON TB1 Ag Value: 0.02 IU/mL
QuantiFERON TB2 Ag Value: 0.02 IU/mL

## 2020-02-07 LAB — LYSOZYME, SERUM: Lysozyme: 7.1 ug/mL (ref 2.5–12.9)

## 2020-02-09 LAB — HLA-B27 ANTIGEN: HLA-B27: NEGATIVE

## 2020-02-24 ENCOUNTER — Ambulatory Visit: Payer: BC Managed Care – PPO | Attending: Internal Medicine

## 2020-02-24 DIAGNOSIS — Z20822 Contact with and (suspected) exposure to covid-19: Secondary | ICD-10-CM

## 2020-02-25 ENCOUNTER — Emergency Department: Payer: BC Managed Care – PPO

## 2020-02-25 ENCOUNTER — Ambulatory Visit (INDEPENDENT_AMBULATORY_CARE_PROVIDER_SITE_OTHER): Payer: BC Managed Care – PPO | Admitting: Physician Assistant

## 2020-02-25 ENCOUNTER — Encounter: Payer: Self-pay | Admitting: Emergency Medicine

## 2020-02-25 ENCOUNTER — Other Ambulatory Visit: Payer: Self-pay

## 2020-02-25 ENCOUNTER — Inpatient Hospital Stay
Admission: EM | Admit: 2020-02-25 | Discharge: 2020-03-03 | DRG: 872 | Disposition: A | Discharge status: Home / Self Care | Payer: BC Managed Care – PPO | Attending: Internal Medicine | Admitting: Internal Medicine

## 2020-02-25 ENCOUNTER — Encounter: Payer: Self-pay | Admitting: Physician Assistant

## 2020-02-25 VITALS — Temp 100.0°F | Wt 265.0 lb

## 2020-02-25 DIAGNOSIS — L03115 Cellulitis of right lower limb: Secondary | ICD-10-CM | POA: Diagnosis present

## 2020-02-25 DIAGNOSIS — Z8249 Family history of ischemic heart disease and other diseases of the circulatory system: Secondary | ICD-10-CM

## 2020-02-25 DIAGNOSIS — I890 Lymphedema, not elsewhere classified: Secondary | ICD-10-CM | POA: Diagnosis present

## 2020-02-25 DIAGNOSIS — Z825 Family history of asthma and other chronic lower respiratory diseases: Secondary | ICD-10-CM

## 2020-02-25 DIAGNOSIS — M79604 Pain in right leg: Secondary | ICD-10-CM | POA: Diagnosis not present

## 2020-02-25 DIAGNOSIS — E876 Hypokalemia: Secondary | ICD-10-CM | POA: Diagnosis present

## 2020-02-25 DIAGNOSIS — Z7982 Long term (current) use of aspirin: Secondary | ICD-10-CM

## 2020-02-25 DIAGNOSIS — A419 Sepsis, unspecified organism: Principal | ICD-10-CM | POA: Diagnosis present

## 2020-02-25 DIAGNOSIS — M6282 Rhabdomyolysis: Secondary | ICD-10-CM | POA: Diagnosis present

## 2020-02-25 DIAGNOSIS — W19XXXA Unspecified fall, initial encounter: Secondary | ICD-10-CM | POA: Diagnosis not present

## 2020-02-25 DIAGNOSIS — R82998 Other abnormal findings in urine: Secondary | ICD-10-CM

## 2020-02-25 DIAGNOSIS — Z91040 Latex allergy status: Secondary | ICD-10-CM | POA: Diagnosis not present

## 2020-02-25 DIAGNOSIS — M7989 Other specified soft tissue disorders: Secondary | ICD-10-CM

## 2020-02-25 DIAGNOSIS — Z833 Family history of diabetes mellitus: Secondary | ICD-10-CM

## 2020-02-25 DIAGNOSIS — M79605 Pain in left leg: Secondary | ICD-10-CM

## 2020-02-25 DIAGNOSIS — M069 Rheumatoid arthritis, unspecified: Secondary | ICD-10-CM | POA: Diagnosis present

## 2020-02-25 DIAGNOSIS — G629 Polyneuropathy, unspecified: Secondary | ICD-10-CM | POA: Diagnosis present

## 2020-02-25 DIAGNOSIS — Z6841 Body Mass Index (BMI) 40.0 and over, adult: Secondary | ICD-10-CM

## 2020-02-25 DIAGNOSIS — I10 Essential (primary) hypertension: Secondary | ICD-10-CM | POA: Diagnosis present

## 2020-02-25 DIAGNOSIS — L0100 Impetigo, unspecified: Secondary | ICD-10-CM | POA: Diagnosis present

## 2020-02-25 DIAGNOSIS — T796XXA Traumatic ischemia of muscle, initial encounter: Secondary | ICD-10-CM | POA: Diagnosis not present

## 2020-02-25 DIAGNOSIS — Z8619 Personal history of other infectious and parasitic diseases: Secondary | ICD-10-CM

## 2020-02-25 DIAGNOSIS — Z8739 Personal history of other diseases of the musculoskeletal system and connective tissue: Secondary | ICD-10-CM

## 2020-02-25 LAB — CBC WITH DIFFERENTIAL/PLATELET
Abs Immature Granulocytes: 0.11 10*3/uL — ABNORMAL HIGH (ref 0.00–0.07)
Basophils Absolute: 0 10*3/uL (ref 0.0–0.1)
Basophils Relative: 0 %
Eosinophils Absolute: 0 10*3/uL (ref 0.0–0.5)
Eosinophils Relative: 0 %
HCT: 31.6 % — ABNORMAL LOW (ref 36.0–46.0)
Hemoglobin: 10.5 g/dL — ABNORMAL LOW (ref 12.0–15.0)
Immature Granulocytes: 1 %
Lymphocytes Relative: 10 %
Lymphs Abs: 1.5 10*3/uL (ref 0.7–4.0)
MCH: 29.2 pg (ref 26.0–34.0)
MCHC: 33.2 g/dL (ref 30.0–36.0)
MCV: 88 fL (ref 80.0–100.0)
Monocytes Absolute: 1.1 10*3/uL — ABNORMAL HIGH (ref 0.1–1.0)
Monocytes Relative: 7 %
Neutro Abs: 12.7 10*3/uL — ABNORMAL HIGH (ref 1.7–7.7)
Neutrophils Relative %: 82 %
Platelets: 221 10*3/uL (ref 150–400)
RBC: 3.59 MIL/uL — ABNORMAL LOW (ref 3.87–5.11)
RDW: 14.6 % (ref 11.5–15.5)
WBC: 15.4 10*3/uL — ABNORMAL HIGH (ref 4.0–10.5)
nRBC: 0 % (ref 0.0–0.2)

## 2020-02-25 LAB — COMPREHENSIVE METABOLIC PANEL
ALT: 16 U/L (ref 0–44)
AST: 22 U/L (ref 15–41)
Albumin: 3 g/dL — ABNORMAL LOW (ref 3.5–5.0)
Alkaline Phosphatase: 61 U/L (ref 38–126)
Anion gap: 12 (ref 5–15)
BUN: 20 mg/dL (ref 8–23)
CO2: 26 mmol/L (ref 22–32)
Calcium: 8.7 mg/dL — ABNORMAL LOW (ref 8.9–10.3)
Chloride: 98 mmol/L (ref 98–111)
Creatinine, Ser: 1.24 mg/dL — ABNORMAL HIGH (ref 0.44–1.00)
GFR calc Af Amer: 53 mL/min — ABNORMAL LOW (ref >60)
GFR calc non Af Amer: 46 mL/min — ABNORMAL LOW (ref >60)
Glucose, Bld: 120 mg/dL — ABNORMAL HIGH (ref 70–99)
Potassium: 3.4 mmol/L — ABNORMAL LOW (ref 3.5–5.1)
Sodium: 136 mmol/L (ref 135–145)
Total Bilirubin: 0.9 mg/dL (ref 0.3–1.2)
Total Protein: 7.5 g/dL (ref 6.5–8.1)

## 2020-02-25 LAB — LACTIC ACID, PLASMA: Lactic Acid, Venous: 1 mmol/L (ref 0.5–1.9)

## 2020-02-25 LAB — SARS-COV-2, NAA 2 DAY TAT

## 2020-02-25 LAB — NOVEL CORONAVIRUS, NAA: SARS-CoV-2, NAA: NOT DETECTED

## 2020-02-25 LAB — CK: Total CK: 384 U/L — ABNORMAL HIGH (ref 38–234)

## 2020-02-25 MED ORDER — SODIUM CHLORIDE 0.9 % IV SOLN
2.0000 g | INTRAVENOUS | Status: DC
  Administered 2020-02-25 – 2020-02-29 (×5): 2 g via INTRAVENOUS
  Filled 2020-02-25: qty 20
  Filled 2020-02-25 (×5): qty 2

## 2020-02-25 MED ORDER — TRIAMCINOLONE ACETONIDE 0.025 % EX CREA
TOPICAL_CREAM | Freq: Two times a day (BID) | CUTANEOUS | Status: DC
  Filled 2020-02-25: qty 15

## 2020-02-25 MED ORDER — FUROSEMIDE 40 MG PO TABS
20.0000 mg | ORAL_TABLET | Freq: Every day | ORAL | Status: DC
  Administered 2020-02-25 – 2020-02-26 (×2): 20 mg via ORAL
  Filled 2020-02-25 (×3): qty 1

## 2020-02-25 MED ORDER — GABAPENTIN 100 MG PO CAPS
100.0000 mg | ORAL_CAPSULE | Freq: Three times a day (TID) | ORAL | Status: DC
  Administered 2020-02-25: 100 mg via ORAL
  Filled 2020-02-25 (×2): qty 1

## 2020-02-25 MED ORDER — ENOXAPARIN SODIUM 40 MG/0.4ML ~~LOC~~ SOLN
40.0000 mg | SUBCUTANEOUS | Status: DC
  Administered 2020-02-25 – 2020-02-27 (×3): 40 mg via SUBCUTANEOUS
  Filled 2020-02-25 (×3): qty 0.4

## 2020-02-25 MED ORDER — MAGNESIUM HYDROXIDE 400 MG/5ML PO SUSP
30.0000 mL | Freq: Every day | ORAL | Status: DC | PRN
  Filled 2020-02-25: qty 30

## 2020-02-25 MED ORDER — DESONIDE 0.05 % EX CREA: TOPICAL_CREAM | Freq: Two times a day (BID) | CUTANEOUS | Status: DC

## 2020-02-25 MED ORDER — FOLIC ACID 1 MG PO TABS
1.0000 mg | ORAL_TABLET | Freq: Every day | ORAL | Status: DC
  Administered 2020-02-25 – 2020-03-02 (×6): 1 mg via ORAL
  Filled 2020-02-25 (×7): qty 1

## 2020-02-25 MED ORDER — CLINDAMYCIN PHOSPHATE 600 MG/50ML IV SOLN: 600.0000 mg | Freq: Once | INTRAVENOUS | Status: DC

## 2020-02-25 MED ORDER — CYCLOPENTOLATE HCL 1 % OP SOLN
1.0000 [drp] | Freq: Three times a day (TID) | OPHTHALMIC | Status: DC
  Administered 2020-02-26 – 2020-03-03 (×19): 1 [drp] via OPHTHALMIC
  Filled 2020-02-25 (×2): qty 2

## 2020-02-25 MED ORDER — METOPROLOL TARTRATE 25 MG PO TABS
25.0000 mg | ORAL_TABLET | Freq: Every day | ORAL | Status: DC
  Administered 2020-02-25 – 2020-03-03 (×8): 25 mg via ORAL
  Filled 2020-02-25 (×9): qty 1

## 2020-02-25 MED ORDER — MORPHINE SULFATE (PF) 2 MG/ML IV SOLN: 2.0000 mg | INTRAVENOUS | Status: DC | PRN

## 2020-02-25 MED ORDER — TRAZODONE HCL 50 MG PO TABS
25.0000 mg | ORAL_TABLET | Freq: Every evening | ORAL | Status: DC | PRN
  Administered 2020-02-28 – 2020-03-02 (×2): 25 mg via ORAL
  Filled 2020-02-25 (×2): qty 1

## 2020-02-25 MED ORDER — IRBESARTAN 75 MG PO TABS
37.5000 mg | ORAL_TABLET | Freq: Every day | ORAL | Status: DC
  Administered 2020-02-26 – 2020-03-03 (×7): 37.5 mg via ORAL
  Filled 2020-02-25 (×8): qty 0.5

## 2020-02-25 MED ORDER — ACETAMINOPHEN 650 MG RE SUPP: 650.0000 mg | Freq: Four times a day (QID) | RECTAL | Status: DC | PRN

## 2020-02-25 MED ORDER — HYDROCHLOROTHIAZIDE 25 MG PO TABS
25.0000 mg | ORAL_TABLET | Freq: Every day | ORAL | Status: DC
  Administered 2020-02-25 – 2020-03-01 (×6): 25 mg via ORAL
  Filled 2020-02-25 (×7): qty 1

## 2020-02-25 MED ORDER — ACETAMINOPHEN 325 MG PO TABS
650.0000 mg | ORAL_TABLET | Freq: Four times a day (QID) | ORAL | Status: DC | PRN
  Administered 2020-02-25 – 2020-02-26 (×2): 650 mg via ORAL
  Filled 2020-02-25 (×2): qty 2

## 2020-02-25 MED ORDER — CYCLOPENTOLATE HCL 1 % OP SOLN
1.0000 [drp] | Freq: Every day | OPHTHALMIC | Status: DC
  Administered 2020-02-25 – 2020-03-03 (×8): 1 [drp] via OPHTHALMIC
  Filled 2020-02-25: qty 2

## 2020-02-25 MED ORDER — METHOTREXATE 2.5 MG PO TABS
15.0000 mg | ORAL_TABLET | ORAL | Status: DC
  Administered 2020-02-27: 15 mg via ORAL
  Filled 2020-02-25: qty 6

## 2020-02-25 MED ORDER — ONDANSETRON HCL 4 MG PO TABS: 4.0000 mg | ORAL_TABLET | Freq: Four times a day (QID) | ORAL | Status: DC | PRN

## 2020-02-25 MED ORDER — ASPIRIN EC 81 MG PO TBEC
81.0000 mg | DELAYED_RELEASE_TABLET | Freq: Every day | ORAL | Status: DC
  Administered 2020-02-26 – 2020-03-03 (×7): 81 mg via ORAL
  Filled 2020-02-25 (×8): qty 1

## 2020-02-25 MED ORDER — ONDANSETRON HCL 4 MG/2ML IJ SOLN: 4.0000 mg | Freq: Four times a day (QID) | INTRAMUSCULAR | Status: DC | PRN

## 2020-02-25 MED ORDER — DIFLUPREDNATE 0.05 % OP EMUL
1.0000 [drp] | Freq: Three times a day (TID) | OPHTHALMIC | Status: DC
  Administered 2020-02-25 – 2020-03-03 (×20): 1 [drp] via OPHTHALMIC
  Filled 2020-02-25 (×2): qty 5

## 2020-02-25 MED ORDER — SODIUM CHLORIDE 0.9 % IV SOLN: INTRAVENOUS | Status: DC

## 2020-02-25 MED ORDER — DIFLUPREDNATE 0.05 % OP EMUL
1.0000 [drp] | Freq: Every day | OPHTHALMIC | Status: DC
  Administered 2020-02-26 – 2020-03-03 (×8): 1 [drp] via OPHTHALMIC
  Filled 2020-02-25: qty 5

## 2020-02-25 NOTE — ED Notes (Signed)
Unable to obtain blood cultures, attempted by 2 RNs. Lab called for phlebotomy.
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
## 2020-02-25 NOTE — ED Notes (Signed)
See triage note  Presents with swelling and redness to right lower leg  Pt has lymphodema but states right leg is worse

## 2020-02-25 NOTE — ED Triage Notes (Signed)
Pt presents to ED via POV with c/o RLE swelling since Tuesday, pt also c/o pain states pain 8/10 at this time.   VORB from Dr. Charna Archer for US venous of RLE.
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
## 2020-02-26 LAB — CBC WITH DIFFERENTIAL/PLATELET
Abs Immature Granulocytes: 0.1 10*3/uL — ABNORMAL HIGH (ref 0.00–0.07)
Basophils Absolute: 0 10*3/uL (ref 0.0–0.1)
Basophils Relative: 0 %
Eosinophils Absolute: 0 10*3/uL (ref 0.0–0.5)
Eosinophils Relative: 0 %
HCT: 32.6 % — ABNORMAL LOW (ref 36.0–46.0)
Hemoglobin: 10.4 g/dL — ABNORMAL LOW (ref 12.0–15.0)
Immature Granulocytes: 1 %
Lymphocytes Relative: 9 %
Lymphs Abs: 1.1 10*3/uL (ref 0.7–4.0)
MCH: 28.8 pg (ref 26.0–34.0)
MCHC: 31.9 g/dL (ref 30.0–36.0)
MCV: 90.3 fL (ref 80.0–100.0)
Monocytes Absolute: 0.8 10*3/uL (ref 0.1–1.0)
Monocytes Relative: 7 %
Neutro Abs: 10.5 10*3/uL — ABNORMAL HIGH (ref 1.7–7.7)
Neutrophils Relative %: 83 %
Platelets: 208 10*3/uL (ref 150–400)
RBC: 3.61 MIL/uL — ABNORMAL LOW (ref 3.87–5.11)
RDW: 14.7 % (ref 11.5–15.5)
WBC: 12.5 10*3/uL — ABNORMAL HIGH (ref 4.0–10.5)
nRBC: 0 % (ref 0.0–0.2)

## 2020-02-26 LAB — BASIC METABOLIC PANEL
Anion gap: 14 (ref 5–15)
BUN: 22 mg/dL (ref 8–23)
CO2: 27 mmol/L (ref 22–32)
Calcium: 8.4 mg/dL — ABNORMAL LOW (ref 8.9–10.3)
Chloride: 97 mmol/L — ABNORMAL LOW (ref 98–111)
Creatinine, Ser: 1.16 mg/dL — ABNORMAL HIGH (ref 0.44–1.00)
GFR calc Af Amer: 58 mL/min — ABNORMAL LOW (ref >60)
GFR calc non Af Amer: 50 mL/min — ABNORMAL LOW (ref >60)
Glucose, Bld: 172 mg/dL — ABNORMAL HIGH (ref 70–99)
Potassium: 3 mmol/L — ABNORMAL LOW (ref 3.5–5.1)
Sodium: 138 mmol/L (ref 135–145)

## 2020-02-26 LAB — HIV ANTIBODY (ROUTINE TESTING W REFLEX): HIV Screen 4th Generation wRfx: NONREACTIVE

## 2020-02-26 MED ORDER — ACETAMINOPHEN 500 MG PO TABS
1000.0000 mg | ORAL_TABLET | Freq: Three times a day (TID) | ORAL | Status: DC | PRN
  Administered 2020-02-26 – 2020-03-03 (×10): 1000 mg via ORAL
  Filled 2020-02-26 (×11): qty 2

## 2020-02-26 MED ORDER — GABAPENTIN 300 MG PO CAPS
300.0000 mg | ORAL_CAPSULE | Freq: Every day | ORAL | Status: DC
  Administered 2020-02-26 – 2020-03-02 (×6): 300 mg via ORAL
  Filled 2020-02-26 (×6): qty 1

## 2020-02-26 MED ORDER — SODIUM CHLORIDE 0.9% FLUSH: 10.0000 mL | INTRAVENOUS | Status: DC | PRN

## 2020-02-26 MED ORDER — GABAPENTIN 300 MG PO CAPS: 300.0000 mg | ORAL_CAPSULE | Freq: Three times a day (TID) | ORAL | Status: DC

## 2020-02-26 NOTE — Progress Notes (Signed)
Rufina Falco NP notified pt Heart rate is elevated and has a mews score of 2.
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
## 2020-02-27 LAB — BASIC METABOLIC PANEL
Anion gap: 10 (ref 5–15)
BUN: 17 mg/dL (ref 8–23)
CO2: 28 mmol/L (ref 22–32)
Calcium: 8.1 mg/dL — ABNORMAL LOW (ref 8.9–10.3)
Chloride: 97 mmol/L — ABNORMAL LOW (ref 98–111)
Creatinine, Ser: 1.07 mg/dL — ABNORMAL HIGH (ref 0.44–1.00)
GFR calc Af Amer: >60 mL/min (ref >60)
GFR calc non Af Amer: 55 mL/min — ABNORMAL LOW (ref >60)
Glucose, Bld: 134 mg/dL — ABNORMAL HIGH (ref 70–99)
Potassium: 3 mmol/L — ABNORMAL LOW (ref 3.5–5.1)
Sodium: 135 mmol/L (ref 135–145)

## 2020-02-27 LAB — CBC
HCT: 31.3 % — ABNORMAL LOW (ref 36.0–46.0)
Hemoglobin: 10 g/dL — ABNORMAL LOW (ref 12.0–15.0)
MCH: 29 pg (ref 26.0–34.0)
MCHC: 31.9 g/dL (ref 30.0–36.0)
MCV: 90.7 fL (ref 80.0–100.0)
Platelets: 222 10*3/uL (ref 150–400)
RBC: 3.45 MIL/uL — ABNORMAL LOW (ref 3.87–5.11)
RDW: 14.6 % (ref 11.5–15.5)
WBC: 9.4 10*3/uL (ref 4.0–10.5)
nRBC: 0 % (ref 0.0–0.2)

## 2020-02-27 LAB — MAGNESIUM: Magnesium: 1.7 mg/dL (ref 1.7–2.4)

## 2020-02-27 MED ORDER — LINEZOLID 600 MG/300ML IV SOLN
600.0000 mg | Freq: Two times a day (BID) | INTRAVENOUS | Status: DC
  Administered 2020-02-27 – 2020-03-01 (×5): 600 mg via INTRAVENOUS
  Filled 2020-02-27 (×10): qty 300

## 2020-02-27 MED ORDER — FUROSEMIDE 10 MG/ML IJ SOLN
40.0000 mg | Freq: Once | INTRAMUSCULAR | Status: AC
  Administered 2020-02-27: 40 mg via INTRAVENOUS
  Filled 2020-02-27: qty 4

## 2020-02-27 MED ORDER — POTASSIUM CHLORIDE CRYS ER 20 MEQ PO TBCR
40.0000 meq | EXTENDED_RELEASE_TABLET | Freq: Once | ORAL | Status: AC
  Administered 2020-02-27: 40 meq via ORAL
  Filled 2020-02-27: qty 2
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
## 2020-02-27 NOTE — Progress Notes (Signed)
Patient temp and heart rate elevated. MEWS score of 2. Dr Billie Ruddy notified, abx ordered by MD.

## 2020-02-28 LAB — BASIC METABOLIC PANEL
Anion gap: 12 (ref 5–15)
BUN: 18 mg/dL (ref 8–23)
CO2: 29 mmol/L (ref 22–32)
Calcium: 8.3 mg/dL — ABNORMAL LOW (ref 8.9–10.3)
Chloride: 97 mmol/L — ABNORMAL LOW (ref 98–111)
Creatinine, Ser: 0.99 mg/dL (ref 0.44–1.00)
GFR calc Af Amer: >60 mL/min (ref >60)
GFR calc non Af Amer: >60 mL/min (ref >60)
Glucose, Bld: 135 mg/dL — ABNORMAL HIGH (ref 70–99)
Potassium: 3 mmol/L — ABNORMAL LOW (ref 3.5–5.1)
Sodium: 138 mmol/L (ref 135–145)

## 2020-02-28 LAB — CBC
HCT: 27.9 % — ABNORMAL LOW (ref 36.0–46.0)
Hemoglobin: 9.2 g/dL — ABNORMAL LOW (ref 12.0–15.0)
MCH: 28.8 pg (ref 26.0–34.0)
MCHC: 33 g/dL (ref 30.0–36.0)
MCV: 87.5 fL (ref 80.0–100.0)
Platelets: 225 K/uL (ref 150–400)
RBC: 3.19 MIL/uL — ABNORMAL LOW (ref 3.87–5.11)
RDW: 14.4 % (ref 11.5–15.5)
WBC: 10.2 K/uL (ref 4.0–10.5)
nRBC: 0 % (ref 0.0–0.2)

## 2020-02-28 LAB — MAGNESIUM: Magnesium: 1.8 mg/dL (ref 1.7–2.4)

## 2020-02-28 MED ORDER — FUROSEMIDE 10 MG/ML IJ SOLN
40.0000 mg | Freq: Two times a day (BID) | INTRAMUSCULAR | Status: DC
  Administered 2020-02-28 – 2020-02-29 (×3): 40 mg via INTRAVENOUS
  Filled 2020-02-28 (×4): qty 4

## 2020-02-28 MED ORDER — POTASSIUM CHLORIDE CRYS ER 20 MEQ PO TBCR
40.0000 meq | EXTENDED_RELEASE_TABLET | ORAL | Status: AC
  Administered 2020-02-28 (×2): 40 meq via ORAL
  Filled 2020-02-28 (×2): qty 2

## 2020-02-28 MED ORDER — ENOXAPARIN SODIUM 40 MG/0.4ML ~~LOC~~ SOLN
40.0000 mg | Freq: Two times a day (BID) | SUBCUTANEOUS | Status: DC
  Administered 2020-02-28 – 2020-03-03 (×8): 40 mg via SUBCUTANEOUS
  Filled 2020-02-28 (×7): qty 0.4
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
## 2020-02-28 NOTE — Progress Notes (Signed)
PHARMACIST - PHYSICIAN COMMUNICATION  CONCERNING:  Enoxaparin (Lovenox) for DVT Prophylaxis    RECOMMENDATION: Patient was prescribed enoxaprin 40mg  q24 hours for VTE prophylaxis.   Filed Weights   02/25/20 1318 02/26/20 0413  Weight: 120.2 kg (265 lb) 120.2 kg (265 lb)    Body mass index is 41.5 kg/m.  Estimated Creatinine Clearance: 77 mL/min (by C-G formula based on SCr of 0.99 mg/dL).   Based on [place] patient is candidate for enoxaparin 40mg  every 12 hour dosing due to BMI being >40.  DESCRIPTION: Pharmacy has adjusted enoxaparin dose per [facility] policy.  Patient is now receiving enoxaparin 40mg  every 12 hours.   Lu Duffel, PharmD, BCPS Clinical Pharmacist 02/28/2020 11:04 AM

## 2020-02-28 NOTE — Plan of Care (Signed)
  Problem: Education: Goal: Knowledge of General Education information will improve Description: Including pain rating scale, medication(s)/side effects and non-pharmacologic comfort measures Outcome: Progressing   Problem: Health Behavior/Discharge Planning: Goal: Ability to manage health-related needs will improve Outcome: Progressing   Problem: Clinical Measurements: Goal: Cardiovascular complication will be avoided Outcome: Progressing   Problem: Coping: Goal: Level of anxiety will decrease Outcome: Progressing   
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
## 2020-02-29 LAB — BASIC METABOLIC PANEL
Anion gap: 11 (ref 5–15)
BUN: 16 mg/dL (ref 8–23)
CO2: 30 mmol/L (ref 22–32)
Calcium: 8.5 mg/dL — ABNORMAL LOW (ref 8.9–10.3)
Chloride: 97 mmol/L — ABNORMAL LOW (ref 98–111)
Creatinine, Ser: 0.89 mg/dL (ref 0.44–1.00)
GFR calc Af Amer: >60 mL/min (ref >60)
GFR calc non Af Amer: >60 mL/min (ref >60)
Glucose, Bld: 134 mg/dL — ABNORMAL HIGH (ref 70–99)
Potassium: 3.6 mmol/L (ref 3.5–5.1)
Sodium: 138 mmol/L (ref 135–145)

## 2020-02-29 LAB — CBC
HCT: 28.6 % — ABNORMAL LOW (ref 36.0–46.0)
Hemoglobin: 9.4 g/dL — ABNORMAL LOW (ref 12.0–15.0)
MCH: 28.7 pg (ref 26.0–34.0)
MCHC: 32.9 g/dL (ref 30.0–36.0)
MCV: 87.2 fL (ref 80.0–100.0)
Platelets: 282 10*3/uL (ref 150–400)
RBC: 3.28 MIL/uL — ABNORMAL LOW (ref 3.87–5.11)
RDW: 14.6 % (ref 11.5–15.5)
WBC: 10.4 10*3/uL (ref 4.0–10.5)
nRBC: 0 % (ref 0.0–0.2)

## 2020-02-29 LAB — MAGNESIUM: Magnesium: 1.7 mg/dL (ref 1.7–2.4)

## 2020-02-29 NOTE — TOC Transition Note (Signed)
Transition of Care [facility]) - CM/SW Discharge Note   Patient Details  Name: Autumn Burton MRN: [id number] Date of Birth: Feb 23, 1956  [facility] CM/SW Contact:  Su Hilt, RN Phone Number: 02/29/2020, 9:53 AM   Clinical Narrative:    Met with the patient to discuss DC plan and needs, She lives at home and her son stays there part time, She has church friends that take her where she needs to go, she has her mothers [employer] commode and does not want a new one, She will need a RW and I notified Brad with [employer], it will be brought to the room for her to take home.  She is up to date with her PCP and she can afford her medications, no additional needs   Final next level of care: Home/Self Care Barriers to Discharge: Continued Medical Work up   Patient Goals and CMS Choice Patient states their goals for this hospitalization and ongoing recovery are:: go home      Discharge Placement                       Discharge Plan and Services   Discharge Planning Services: CM Consult            DME Arranged: Gilford Rile rolling DME Agency: [employer] Date DME Agency Contacted: 02/29/20 Time DME Agency Contacted: [phone] Representative spoke with at DME Agency: Leroy Sea HH Arranged: NA          Social Determinants of Health ([place]) Interventions     Readmission Risk Interventions No flowsheet data found.
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
## 2020-03-01 DIAGNOSIS — E876 Hypokalemia: Secondary | ICD-10-CM

## 2020-03-01 DIAGNOSIS — A419 Sepsis, unspecified organism: Secondary | ICD-10-CM

## 2020-03-01 DIAGNOSIS — M6282 Rhabdomyolysis: Secondary | ICD-10-CM

## 2020-03-01 DIAGNOSIS — Z8619 Personal history of other infectious and parasitic diseases: Secondary | ICD-10-CM

## 2020-03-01 DIAGNOSIS — G629 Polyneuropathy, unspecified: Secondary | ICD-10-CM

## 2020-03-01 DIAGNOSIS — M069 Rheumatoid arthritis, unspecified: Secondary | ICD-10-CM

## 2020-03-01 DIAGNOSIS — Z8739 Personal history of other diseases of the musculoskeletal system and connective tissue: Secondary | ICD-10-CM

## 2020-03-01 LAB — CBC
HCT: 30 % — ABNORMAL LOW (ref 36.0–46.0)
Hemoglobin: 9.7 g/dL — ABNORMAL LOW (ref 12.0–15.0)
MCH: 29.2 pg (ref 26.0–34.0)
MCHC: 32.3 g/dL (ref 30.0–36.0)
MCV: 90.4 fL (ref 80.0–100.0)
Platelets: 333 10*3/uL (ref 150–400)
RBC: 3.32 MIL/uL — ABNORMAL LOW (ref 3.87–5.11)
RDW: 14.6 % (ref 11.5–15.5)
WBC: 9.9 10*3/uL (ref 4.0–10.5)
nRBC: 0 % (ref 0.0–0.2)

## 2020-03-01 LAB — CULTURE, BLOOD (ROUTINE X 2)
Culture: NO GROWTH
Culture: NO GROWTH
Special Requests: ADEQUATE
Special Requests: ADEQUATE

## 2020-03-01 LAB — BASIC METABOLIC PANEL
Anion gap: 12 (ref 5–15)
BUN: 19 mg/dL (ref 8–23)
CO2: 30 mmol/L (ref 22–32)
Calcium: 8.5 mg/dL — ABNORMAL LOW (ref 8.9–10.3)
Chloride: 96 mmol/L — ABNORMAL LOW (ref 98–111)
Creatinine, Ser: 1.05 mg/dL — ABNORMAL HIGH (ref 0.44–1.00)
GFR calc Af Amer: >60 mL/min (ref >60)
GFR calc non Af Amer: 56 mL/min — ABNORMAL LOW (ref >60)
Glucose, Bld: 130 mg/dL — ABNORMAL HIGH (ref 70–99)
Potassium: 3.5 mmol/L (ref 3.5–5.1)
Sodium: 138 mmol/L (ref 135–145)

## 2020-03-01 LAB — MAGNESIUM: Magnesium: 1.7 mg/dL (ref 1.7–2.4)

## 2020-03-01 MED ORDER — MUPIROCIN CALCIUM 2 % EX CREA
TOPICAL_CREAM | Freq: Two times a day (BID) | CUTANEOUS | Status: DC
  Filled 2020-03-01: qty 15

## 2020-03-01 MED ORDER — SULFAMETHOXAZOLE-TRIMETHOPRIM 400-80 MG PO TABS
1.0000 | ORAL_TABLET | Freq: Two times a day (BID) | ORAL | Status: DC
  Administered 2020-03-01 – 2020-03-03 (×5): 1 via ORAL
  Filled 2020-03-01 (×6): qty 1

## 2020-03-01 MED ORDER — FUROSEMIDE 40 MG PO TABS
40.0000 mg | ORAL_TABLET | Freq: Two times a day (BID) | ORAL | Status: DC
  Administered 2020-03-01 – 2020-03-03 (×4): 40 mg via ORAL
  Filled 2020-03-01 (×4): qty 1

## 2020-03-01 NOTE — Progress Notes (Signed)
PT Cancellation Note  Patient Details Name: [name] MRN: [id number] DOB: [birth year]   Cancelled Treatment:    Reason Eval/Treat Not Completed: PT screened, no needs identified, will sign off.  Patient evaluated by physical therapy 02/28/20 with no skilled needs identified and PT orders completed.  Spoke to nursing, CNA, and patient who all agree there has been no negative change in status and no skilled PT services required.  Will complete PT orders at this time but will reassess pt pending a change in status upon receipt of new PT orders.    Linus Salmons PT, DPT 03/01/20, 4:14 PM

## 2020-03-01 NOTE — Progress Notes (Signed)
D: Pt alert and oriented x 4.  Pt denies experiencing any pain only discomfort and denies wanting anything at this time. Pt states will notify this writer when she would like pain meds.   A: Scheduled medications administered to pt, per MD orders. Support and encouragement provided. Frequent verbal contact made.    R: No adverse drug reactions noted. Pt complaint with medications and treatment plan. Pt interacts well with staff on the unit. Pt is stable at this time, Will continue to monitor and provide care for as ordered.

## 2020-03-01 NOTE — Progress Notes (Signed)
[facility] PROGRESS NOTE    Progress Note  Autumn Burton  [id number] DOB: [birth year] DOA: 02/25/2020 PCP: Mar Daring, PA-C     Brief Narrative:   Autumn Burton is an 64 y.o. female known Struve hypertension right lower extremity edema edema in the remote of acute cholectomy presents to the ED with acute onset of right lower extremity swelling extending from the knee distally tenderness and warmth to touch that started 4 days prior to admission.  Assessment/Plan:   Sepsis due to Cellulitis of right leg superimposed on right leg lymphedema: She was started empirically on admission on IV Rocephin and linezolid due to sepsis. Once her leukocytosis is improved. We will transition her to oral antibiotics and monitor overnight. Discontinue Rocephin, we will transition her to oral Bactrim.  Check a basic metabolic panel tomorrow morning.  Hypokalemia: Replete orally now resolved.  Rhabdomyolysis Mild less than thousand.  Rheumatoid arthritis (HCC) Continue methotrexate  Peripheral neuropathy: Continue Neurontin.  Essential hypertension: Continue current medications no changes made to her home regimen.  Facial impetigo: Start mupirocin cream Bactrim should cover.  DVT prophylaxis: lovenox Family Communication:none Status is: Inpatient  Remains inpatient appropriate because:IV treatments appropriate due to intensity of illness or inability to take PO   Dispo: The patient is from: Home              Anticipated d/c is to: Home              Anticipated d/c date is: 1 day              Patient currently is not medically stable to d/c.  Code Status:     Code Status Orders  (From admission, onward)         Start     Ordered   02/25/20 1859  Full code  Continuous     02/25/20 1900        Code Status History    This patient has a current code status but no historical code status.   Advance Care Planning Activity        IV Access:     Peripheral IV   Procedures and diagnostic studies:   No results found.   Medical Consultants:    None.  Anti-Infectives:   Bactrim  Subjective:    Autumn Burton she relates her left leg feels better less swollen less tender  Objective:    Vitals:   02/29/20 0826 02/29/20 1555 03/01/20 0810 03/01/20 1131  BP:  109/62 (!) 143/72 108/68  Pulse: (!) 105 98 (!) 103 87  Resp:      Temp:  99.3 F (37.4 C) 98.1 F (36.7 C)   TempSrc:  Oral Oral   SpO2:  100% 100% 100%  Weight:      Height:       SpO2: 100 %   Intake/Output Summary (Last 24 hours) at 03/01/2020 1249 Last data filed at 02/29/2020 1903 Gross per 24 hour  Intake 480 ml  Output -  Net 480 ml   Filed Weights   02/25/20 1318 02/26/20 0413  Weight: 120.2 kg 120.2 kg    Exam: General exam: In no acute distress. Respiratory system: Good air movement and clear to auscultation. Cardiovascular system: S1 & S2 heard, RRR.  Gastrointestinal system: Abdomen is nondistended, soft and nontender.  Extremities: No pedal edema. Skin: Left leg has lymphedema changes itch the skin over where the cellulitis was present and shiny it appears to to  start desquamate sometimes seen is not warm or tender to touch.  Data Reviewed:    Labs: Basic Metabolic Panel: Recent Labs  Lab 02/26/20 0924 02/26/20 0924 02/27/20 0829 02/27/20 0829 02/28/20 0355 02/28/20 0355 02/29/20 0420 03/01/20 0520  NA 138  --  135  --  138  --  138 138  K 3.0*   < > 3.0*   < > 3.0*   < > 3.6 3.5  CL 97*  --  97*  --  97*  --  97* 96*  CO2 27  --  28  --  29  --  30 30  GLUCOSE 172*  --  134*  --  135*  --  134* 130*  BUN 22  --  17  --  18  --  16 19  CREATININE 1.16*  --  1.07*  --  0.99  --  0.89 1.05*  CALCIUM 8.4*  --  8.1*  --  8.3*  --  8.5* 8.5*  MG  --   --  1.7  --  1.8  --  1.7 1.7   < > = values in this interval not displayed.   GFR Estimated Creatinine Clearance: 72.6 mL/min (A) (by C-G formula based on SCr of  1.05 mg/dL (H)). Liver Function Tests: Recent Labs  Lab 02/25/20 1646  AST 22  ALT 16  ALKPHOS 61  BILITOT 0.9  PROT 7.5  ALBUMIN 3.0*   No results for input(s): LIPASE, AMYLASE in the last 168 hours. No results for input(s): AMMONIA in the last 168 hours. Coagulation profile No results for input(s): INR, PROTIME in the last 168 hours. COVID-19 Labs  No results for input(s): DDIMER, FERRITIN, LDH, CRP in the last 72 hours.  Lab Results  Component Value Date   [place] Not Detected 02/24/2020    CBC: Recent Labs  Lab 02/25/20 1646 02/25/20 1646 02/26/20 0924 02/27/20 0829 02/28/20 0355 02/29/20 0420 03/01/20 0520  WBC 15.4*   < > 12.5* 9.4 10.2 10.4 9.9  NEUTROABS 12.7*  --  10.5*  --   --   --   --   HGB 10.5*   < > 10.4* 10.0* 9.2* 9.4* 9.7*  HCT 31.6*   < > 32.6* 31.3* 27.9* 28.6* 30.0*  MCV 88.0   < > 90.3 90.7 87.5 87.2 90.4  PLT 221   < > 208 222 225 282 333   < > = values in this interval not displayed.   Cardiac Enzymes: Recent Labs  Lab 02/25/20 1646  CKTOTAL 384*   BNP (last 3 results) No results for input(s): PROBNP in the last 8760 hours. CBG: No results for input(s): GLUCAP in the last 168 hours. D-Dimer: No results for input(s): DDIMER in the last 72 hours. Hgb A1c: No results for input(s): HGBA1C in the last 72 hours. Lipid Profile: No results for input(s): CHOL, HDL, LDLCALC, TRIG, CHOLHDL, LDLDIRECT in the last 72 hours. Thyroid function studies: No results for input(s): TSH, T4TOTAL, T3FREE, THYROIDAB in the last 72 hours.  Invalid input(s): FREET3 Anemia work up: No results for input(s): VITAMINB12, FOLATE, FERRITIN, TIBC, IRON, RETICCTPCT in the last 72 hours. Sepsis Labs: Recent Labs  Lab 02/25/20 1646 02/26/20 0924 02/27/20 0829 02/28/20 0355 02/29/20 0420 03/01/20 0520  WBC 15.4*   < > 9.4 10.2 10.4 9.9  LATICACIDVEN 1.0  --   --   --   --   --    < > = values in this interval not  displayed.   Microbiology Recent  Results (from the past 240 hour(s))  Novel Coronavirus, NAA ([facility])     Status: None   Collection Time: 02/24/20  3:48 PM   Specimen: Nasopharyngeal(NP) swabs in vial transport medium   NASOPHARYNGE  TESTING  Result Value Ref Range Status   SARS-CoV-2, NAA Not Detected Not Detected Final    Comment: This nucleic acid amplification test was developed and its performance characteristics determined by [employer]. Nucleic acid amplification tests include RT-PCR and TMA. This test has not been FDA cleared or approved. This test has been authorized by FDA under an Emergency Use Authorization (EUA). This test is only authorized for the duration of time the declaration that circumstances exist justifying the authorization of the emergency use of in vitro diagnostic tests for detection of SARS-CoV-2 virus and/or diagnosis of COVID-19 infection under section 564(b)(1) of the Act, 21 U.S.C. [id number]) (1), unless the authorization is terminated or revoked sooner. When diagnostic testing is negative, the possibility of a false negative result should be considered in the context of a patient's recent exposures and the presence of clinical signs and symptoms consistent with COVID-19. An individual without symptoms of COVID-19 and who is not shedding SARS-CoV-2 virus wo uld expect to have a negative (not detected) result in this assay.   SARS-COV-2, NAA 2 DAY TAT     Status: None   Collection Time: 02/24/20  3:48 PM   NASOPHARYNGE  TESTING  Result Value Ref Range Status   SARS-CoV-2, NAA 2 DAY TAT Performed  Final  Blood culture (routine x 2)     Status: None   Collection Time: 02/25/20  6:58 PM   Specimen: BLOOD  Result Value Ref Range Status   Specimen Description BLOOD LEFT HAND  Final   Special Requests   Final    BOTTLES DRAWN AEROBIC AND ANAEROBIC Blood Culture adequate volume   Culture   Final    NO GROWTH 5 DAYS Performed at [facility], [address]  [place], [place] [address]    Report Status 03/01/2020 FINAL  Final  Blood culture (routine x 2)     Status: None   Collection Time: 02/25/20  7:02 PM   Specimen: BLOOD  Result Value Ref Range Status   Specimen Description BLOOD RIGHT HAND  Final   Special Requests   Final    BOTTLES DRAWN AEROBIC AND ANAEROBIC Blood Culture adequate volume   Culture   Final    NO GROWTH 5 DAYS Performed at [facility], [address], [place], [place] [address]    Report Status 03/01/2020 FINAL  Final     Medications:   . aspirin EC  81 mg Oral Daily  . cyclopentolate  1 drop Left Eye Daily  . cyclopentolate  1 drop Right Eye TID  . Difluprednate  1 drop Left Eye Daily  . Difluprednate  1 drop Right Eye TID  . enoxaparin (LOVENOX) injection  40 mg Subcutaneous Q12H  . folic acid  1 mg Oral QHS  . furosemide  40 mg Intravenous BID  . gabapentin  300 mg Oral QHS  . hydrochlorothiazide  25 mg Oral Daily  . irbesartan  37.5 mg Oral Daily  . methotrexate  15 mg Oral Q Sun  . metoprolol tartrate  25 mg Oral Daily  . triamcinolone   Topical BID   Continuous Infusions: . cefTRIAXone (ROCEPHIN)  IV 2 g (02/29/20 2213)  . clindamycin (CLEOCIN) IV    .  linezolid (ZYVOX) IV 600 mg (03/01/20 0103)      LOS: 5 days   Charlynne Cousins  [facility]  03/01/2020, 12:49 PM

## 2020-03-02 ENCOUNTER — Inpatient Hospital Stay: Payer: BC Managed Care – PPO

## 2020-03-02 LAB — BASIC METABOLIC PANEL
Anion gap: 12 (ref 5–15)
BUN: 22 mg/dL (ref 8–23)
CO2: 30 mmol/L (ref 22–32)
Calcium: 9 mg/dL (ref 8.9–10.3)
Chloride: 97 mmol/L — ABNORMAL LOW (ref 98–111)
Creatinine, Ser: 1.16 mg/dL — ABNORMAL HIGH (ref 0.44–1.00)
GFR calc Af Amer: 58 mL/min — ABNORMAL LOW (ref >60)
GFR calc non Af Amer: 50 mL/min — ABNORMAL LOW (ref >60)
Glucose, Bld: 132 mg/dL — ABNORMAL HIGH (ref 70–99)
Potassium: 3.6 mmol/L (ref 3.5–5.1)
Sodium: 139 mmol/L (ref 135–145)

## 2020-03-02 LAB — CBC
HCT: 33.3 % — ABNORMAL LOW (ref 36.0–46.0)
Hemoglobin: 10.9 g/dL — ABNORMAL LOW (ref 12.0–15.0)
MCH: 29.1 pg (ref 26.0–34.0)
MCHC: 32.7 g/dL (ref 30.0–36.0)
MCV: 89 fL (ref 80.0–100.0)
Platelets: 383 10*3/uL (ref 150–400)
RBC: 3.74 MIL/uL — ABNORMAL LOW (ref 3.87–5.11)
RDW: 14.3 % (ref 11.5–15.5)
WBC: 9 10*3/uL (ref 4.0–10.5)
nRBC: 0 % (ref 0.0–0.2)

## 2020-03-02 LAB — MAGNESIUM: Magnesium: 1.9 mg/dL (ref 1.7–2.4)

## 2020-03-02 NOTE — Progress Notes (Signed)
D: Pt alert and oriented x 4. Pt report experiencing unrelieved pain in right leg, and does not feel comfortable leaving today. MD notified and place orders for an ultra sound, concern of DVT.  A: Scheduled medications administered to pt, per MD orders. Support and encouragement provided. Frequent verbal contact made.    R: No adverse drug reactions noted. Pt complaint with medications and treatment plan. Pt interacts well with staff on the unit. Pt stable at this time, Will continue to monitor and provide care for as ordered.

## 2020-03-02 NOTE — Progress Notes (Signed)
[facility] PROGRESS NOTE    Progress Note  Autumn Burton  [id number] DOB: 02/01/1956 DOA: 02/25/2020 PCP: Mar Daring, PA-C     Brief Narrative:   Autumn Burton is an 64 y.o. female known Struve hypertension right lower extremity edema edema in the remote of acute cholectomy presents to the ED with acute onset of right lower extremity swelling extending from the knee distally tenderness and warmth to touch that started 4 days prior to admission.  Assessment/Plan:   Sepsis due to Cellulitis of right leg superimposed on right leg lymphedema: She was started empirically on admission on IV Rocephin and linezolid due to sepsis. She has defervesced her leukocytosis improved. We will transition her to oral antibiotics and monitor overnight. Continue Bactrim for 4 additional days.  Has remained stable  Hypokalemia: Replete orally now resolved.  Rhabdomyolysis Mild less than thousand.  Rheumatoid arthritis (HCC) Continue methotrexate  Peripheral neuropathy: Continue Neurontin.  Essential hypertension: Continue current medications no changes made to her home regimen.  Facial impetigo: Start mupirocin cream Bactrim should cover.  New right lower extremity pain; She has been immobile for several days in house with the grossly large right lower extremity which is lymphedematous check lower extremity Doppler to rule out a DVT.  DVT prophylaxis: lovenox Family Communication:none Status is: Inpatient  Remains inpatient appropriate because:IV treatments appropriate due to intensity of illness or inability to take PO   Dispo: The patient is from: Home              Anticipated d/c is to: Home              Anticipated d/c date is: 1 day              Patient currently is not medically stable to d/c.  Code Status:     Code Status Orders  (From admission, onward)         Start     Ordered   02/25/20 1859  Full code  Continuous     02/25/20 1900          Code Status History    This patient has a current code status but no historical code status.   Advance Care Planning Activity        IV Access:    Peripheral IV   Procedures and diagnostic studies:   No results found.   Medical Consultants:    None.  Anti-Infectives:   Bactrim  Subjective:    Autumn Burton she relates she is tender in the back of her right  calf  Objective:    Vitals:   03/01/20 1729 03/02/20 0011 03/02/20 0734 03/02/20 0906  BP: 119/79 121/72 (!) 141/70 113/70  Pulse: 88 86 95 98  Resp:   17   Temp:  98 F (36.7 C) 98 F (36.7 C)   TempSrc:  Oral Oral   SpO2: 100% 99% 100% 100%  Weight:      Height:       SpO2: 100 %  No intake or output data in the 24 hours ending 03/02/20 1239 Filed Weights   02/25/20 1318 02/26/20 0413  Weight: 120.2 kg 120.2 kg    Exam: General exam: In no acute distress. Respiratory system: Good air movement and clear to auscultation. Cardiovascular system: S1 & S2 heard, RRR. No JVD. Gastrointestinal system: Abdomen is nondistended, soft and nontender.  Central nervous system: Alert and oriented. No focal neurological deficits. Extremities: No pedal edema. Skin:  The right leg has significant lymphedema, is not erythematous or warm to touch but it is tender at the calf area   Data Reviewed:    Labs: Basic Metabolic Panel: Recent Labs  Lab 02/27/20 0829 02/27/20 0829 02/28/20 0355 02/28/20 0355 02/29/20 0420 02/29/20 0420 03/01/20 0520 03/02/20 0513  NA 135  --  138  --  138  --  138 139  K 3.0*   < > 3.0*   < > 3.6   < > 3.5 3.6  CL 97*  --  97*  --  97*  --  96* 97*  CO2 28  --  29  --  30  --  30 30  GLUCOSE 134*  --  135*  --  134*  --  130* 132*  BUN 17  --  18  --  16  --  19 22  CREATININE 1.07*  --  0.99  --  0.89  --  1.05* 1.16*  CALCIUM 8.1*  --  8.3*  --  8.5*  --  8.5* 9.0  MG 1.7  --  1.8  --  1.7  --  1.7 1.9   < > = values in this interval not displayed.    GFR Estimated Creatinine Clearance: 65.7 mL/min (A) (by C-G formula based on SCr of 1.16 mg/dL (H)). Liver Function Tests: Recent Labs  Lab 02/25/20 1646  AST 22  ALT 16  ALKPHOS 61  BILITOT 0.9  PROT 7.5  ALBUMIN 3.0*   No results for input(s): LIPASE, AMYLASE in the last 168 hours. No results for input(s): AMMONIA in the last 168 hours. Coagulation profile No results for input(s): INR, PROTIME in the last 168 hours. COVID-19 Labs  No results for input(s): DDIMER, FERRITIN, LDH, CRP in the last 72 hours.  Lab Results  Component Value Date   [place] Not Detected 02/24/2020    CBC: Recent Labs  Lab 02/25/20 1646 02/25/20 1646 02/26/20 0924 02/26/20 0924 02/27/20 0829 02/28/20 0355 02/29/20 0420 03/01/20 0520 03/02/20 0513  WBC 15.4*   < > 12.5*   < > 9.4 10.2 10.4 9.9 9.0  NEUTROABS 12.7*  --  10.5*  --   --   --   --   --   --   HGB 10.5*   < > 10.4*   < > 10.0* 9.2* 9.4* 9.7* 10.9*  HCT 31.6*   < > 32.6*   < > 31.3* 27.9* 28.6* 30.0* 33.3*  MCV 88.0   < > 90.3   < > 90.7 87.5 87.2 90.4 89.0  PLT 221   < > 208   < > 222 225 282 333 383   < > = values in this interval not displayed.   Cardiac Enzymes: Recent Labs  Lab 02/25/20 1646  CKTOTAL 384*   BNP (last 3 results) No results for input(s): PROBNP in the last 8760 hours. CBG: No results for input(s): GLUCAP in the last 168 hours. D-Dimer: No results for input(s): DDIMER in the last 72 hours. Hgb A1c: No results for input(s): HGBA1C in the last 72 hours. Lipid Profile: No results for input(s): CHOL, HDL, LDLCALC, TRIG, CHOLHDL, LDLDIRECT in the last 72 hours. Thyroid function studies: No results for input(s): TSH, T4TOTAL, T3FREE, THYROIDAB in the last 72 hours.  Invalid input(s): FREET3 Anemia work up: No results for input(s): VITAMINB12, FOLATE, FERRITIN, TIBC, IRON, RETICCTPCT in the last 72 hours. Sepsis Labs: Recent Labs  Lab 02/25/20 1646 02/26/20 0924 02/28/20 0355 02/29/20  [id number]  03/01/20 0520 03/02/20 0513  WBC 15.4*   < > 10.2 10.4 9.9 9.0  LATICACIDVEN 1.0  --   --   --   --   --    < > = values in this interval not displayed.   Microbiology Recent Results (from the past 240 hour(s))  Novel Coronavirus, NAA ([facility])     Status: None   Collection Time: 02/24/20  3:48 PM   Specimen: Nasopharyngeal(NP) swabs in vial transport medium   NASOPHARYNGE  TESTING  Result Value Ref Range Status   SARS-CoV-2, NAA Not Detected Not Detected Final    Comment: This nucleic acid amplification test was developed and its performance characteristics determined by [employer]. Nucleic acid amplification tests include RT-PCR and TMA. This test has not been FDA cleared or approved. This test has been authorized by FDA under an Emergency Use Authorization (EUA). This test is only authorized for the duration of time the declaration that circumstances exist justifying the authorization of the emergency use of in vitro diagnostic tests for detection of SARS-CoV-2 virus and/or diagnosis of COVID-19 infection under section 564(b)(1) of the Act, 21 U.S.C. [id number]) (1), unless the authorization is terminated or revoked sooner. When diagnostic testing is negative, the possibility of a false negative result should be considered in the context of a patient's recent exposures and the presence of clinical signs and symptoms consistent with COVID-19. An individual without symptoms of COVID-19 and who is not shedding SARS-CoV-2 virus wo uld expect to have a negative (not detected) result in this assay.   SARS-COV-2, NAA 2 DAY TAT     Status: None   Collection Time: 02/24/20  3:48 PM   NASOPHARYNGE  TESTING  Result Value Ref Range Status   SARS-CoV-2, NAA 2 DAY TAT Performed  Final  Blood culture (routine x 2)     Status: None   Collection Time: 02/25/20  6:58 PM   Specimen: BLOOD  Result Value Ref Range Status   Specimen Description BLOOD LEFT HAND  Final   Special  Requests   Final    BOTTLES DRAWN AEROBIC AND ANAEROBIC Blood Culture adequate volume   Culture   Final    NO GROWTH 5 DAYS Performed at [facility], [address], [place], [place] [address]    Report Status 03/01/2020 FINAL  Final  Blood culture (routine x 2)     Status: None   Collection Time: 02/25/20  7:02 PM   Specimen: BLOOD  Result Value Ref Range Status   Specimen Description BLOOD RIGHT HAND  Final   Special Requests   Final    BOTTLES DRAWN AEROBIC AND ANAEROBIC Blood Culture adequate volume   Culture   Final    NO GROWTH 5 DAYS Performed at [facility], [address], [place],  [address]    Report Status 03/01/2020 FINAL  Final     Medications:   . aspirin EC  81 mg Oral Daily  . cyclopentolate  1 drop Left Eye Daily  . cyclopentolate  1 drop Right Eye TID  . Difluprednate  1 drop Left Eye Daily  . Difluprednate  1 drop Right Eye TID  . enoxaparin (LOVENOX) injection  40 mg Subcutaneous Q12H  . folic acid  1 mg Oral QHS  . furosemide  40 mg Oral BID  . gabapentin  300 mg Oral QHS  . irbesartan  37.5 mg Oral Daily  . methotrexate  15 mg Oral Q Sun  .  metoprolol tartrate  25 mg Oral Daily  . mupirocin cream   Topical BID  . sulfamethoxazole-trimethoprim  1 tablet Oral Q12H  . triamcinolone   Topical BID   Continuous Infusions:     LOS: 6 days   Charlynne Cousins  [facility]  03/02/2020, 12:39 PM

## 2020-03-03 DIAGNOSIS — M79605 Pain in left leg: Secondary | ICD-10-CM

## 2020-03-03 LAB — BASIC METABOLIC PANEL
Anion gap: 13 (ref 5–15)
BUN: 23 mg/dL (ref 8–23)
CO2: 30 mmol/L (ref 22–32)
Calcium: 8.9 mg/dL (ref 8.9–10.3)
Chloride: 98 mmol/L (ref 98–111)
Creatinine, Ser: 1.34 mg/dL — ABNORMAL HIGH (ref 0.44–1.00)
GFR calc Af Amer: 48 mL/min — ABNORMAL LOW (ref >60)
GFR calc non Af Amer: 42 mL/min — ABNORMAL LOW (ref >60)
Glucose, Bld: 130 mg/dL — ABNORMAL HIGH (ref 70–99)
Potassium: 3.7 mmol/L (ref 3.5–5.1)
Sodium: 141 mmol/L (ref 135–145)

## 2020-03-03 LAB — CBC
HCT: 32.2 % — ABNORMAL LOW (ref 36.0–46.0)
Hemoglobin: 10.5 g/dL — ABNORMAL LOW (ref 12.0–15.0)
MCH: 29.4 pg (ref 26.0–34.0)
MCHC: 32.6 g/dL (ref 30.0–36.0)
MCV: 90.2 fL (ref 80.0–100.0)
Platelets: 404 10*3/uL — ABNORMAL HIGH (ref 150–400)
RBC: 3.57 MIL/uL — ABNORMAL LOW (ref 3.87–5.11)
RDW: 14.3 % (ref 11.5–15.5)
WBC: 9.9 10*3/uL (ref 4.0–10.5)
nRBC: 0 % (ref 0.0–0.2)

## 2020-03-03 LAB — MAGNESIUM: Magnesium: 1.9 mg/dL (ref 1.7–2.4)

## 2020-03-03 MED ORDER — FUROSEMIDE 40 MG PO TABS: 40.0000 mg | ORAL_TABLET | Freq: Every day | ORAL | Status: DC

## 2020-03-03 MED ORDER — MUPIROCIN CALCIUM 2 % EX CREA: TOPICAL_CREAM | Freq: Two times a day (BID) | CUTANEOUS | 0 refills | Status: DC

## 2020-03-03 MED ORDER — SULFAMETHOXAZOLE-TRIMETHOPRIM 400-80 MG PO TABS: 1.0000 | ORAL_TABLET | Freq: Two times a day (BID) | ORAL | 0 refills | Status: DC

## 2020-03-03 MED ORDER — METHOCARBAMOL 500 MG PO TABS: 500.0000 mg | ORAL_TABLET | Freq: Four times a day (QID) | ORAL | 0 refills | Status: DC

## 2020-03-03 MED ORDER — SODIUM CHLORIDE 0.9 % IV BOLUS
1000.0000 mL | Freq: Once | INTRAVENOUS | Status: AC
  Administered 2020-03-03: 1000 mL via INTRAVENOUS

## 2020-03-03 NOTE — Plan of Care (Signed)
  Problem: Education: Goal: Knowledge of General Education information will improve Description: Including pain rating scale, medication(s)/side effects and non-pharmacologic comfort measures Outcome: Progressing   Problem: Health Behavior/Discharge Planning: Goal: Ability to manage health-related needs will improve Outcome: Progressing   Problem: Clinical Measurements: Goal: Ability to maintain clinical measurements within normal limits will improve Outcome: Not Progressing Note: Elevated heart rate 117 at this time. Per NT patient was possibly engaged in activity prior to vital signs being checked. Will continue to monitor.  Goal: Will remain free from infection Outcome: Progressing Goal: Diagnostic test results will improve Outcome: Progressing Goal: Respiratory complications will improve Outcome: Progressing Goal: Cardiovascular complication will be avoided Outcome: Progressing   Problem: Nutrition: Goal: Adequate nutrition will be maintained Outcome: Progressing   Problem: Activity: Goal: Risk for activity intolerance will decrease Outcome: Progressing

## 2020-03-03 NOTE — Discharge Summary (Signed)
Physician Discharge Summary  Autumn Burton [id number] DOB: 1956-09-12 DOA: 02/25/2020  PCP: Mar Daring, PA-C  Admit date: 02/25/2020 Discharge date: 03/03/2020  Admitted From: Home Disposition:  Home  Recommendations for Outpatient Follow-up:  1. Follow up with PCP in 1-2 weeks  homeme Health:Yes Equipment/Devices:None  Discharge Condition:Stable CODE STATUS:Full Diet recommendation: Heart Healthy  Brief/Interim Summary: 64 y.o. female known Struve hypertension right lower extremity edema edema in the remote of acute cholectomy presents to the ED with acute onset of right lower extremity swelling extending from the knee distally tenderness and warmth to touch that started 4 days prior to admission  Discharge Diagnoses:  Active Problems:   Cellulitis of right leg   Sepsis (HCC)   Hypokalemia   Rhabdomyolysis   Rheumatoid arthritis ([place])   Peripheral neuropathy Sepsis due to cellulitis of the right lower extremity superimposed on lymphedema: On admission she was started empirically on IV antibiotics she defervesced her sepsis resolved. He was transitioned to oral Bactrim which she will continue as an outpatient. Lower extremity Doppler was negative for DVT.  Hypokalemia: Replete orally now resolved.  Rhabdomyolysis: Mild less than thousand creatinine remained stable.  Rheumatoid arthritis: Continue methotrexate.  Peripheral neuropathy: Continue Neurontin.  Essential hypertension: No change made to her medication.  Facial impetigo: Bactrim should cover doing better.    Discharge Instructions  Discharge Instructions    Diet - low sodium heart healthy   Complete by: As directed    Increase activity slowly   Complete by: As directed      Allergies as of 03/03/2020      Reactions   Latex Itching, Rash      Medication List    TAKE these medications   aspirin EC 81 MG tablet Take by mouth.   cyclopentolate 1 % ophthalmic  solution Commonly known as: CYCLODRYL,CYCLOGYL Place 1 drop into both eyes See admin instructions. Administer 1 drop in left eye daily and administer 1 drop in right eye three times daily   Durezol 0.05 % Emul Generic drug: Difluprednate Place 1 drop into both eyes See admin instructions. Administer 1 drop in left eye daily and administer 1 drop in right eye three times daily   folic acid 1 MG tablet Commonly known as: FOLVITE Take 1 mg by mouth at bedtime.   gabapentin 100 MG capsule Commonly known as: NEURONTIN Take 300 mg by mouth 3 (three) times daily.   hydrochlorothiazide 25 MG tablet Commonly known as: HYDRODIURIL Take 1 tablet by mouth once daily What changed: when to take this   methocarbamol 500 MG tablet Commonly known as: Robaxin Take 1 tablet (500 mg total) by mouth 4 (four) times daily.   methotrexate 2.5 MG tablet Take 15 mg by mouth every Sunday.   metoprolol tartrate 25 MG tablet Commonly known as: LOPRESSOR Take 1 tablet by mouth once daily What changed: when to take this   mupirocin cream 2 % Commonly known as: BACTROBAN Apply topically 2 (two) times daily.   olmesartan 20 MG tablet Commonly known as: BENICAR Take 1 tablet by mouth once daily What changed: when to take this   sulfamethoxazole-trimethoprim 400-80 MG tablet Commonly known as: BACTRIM Take 1 tablet by mouth every 12 (twelve) hours.   triamcinolone 0.025 % ointment Commonly known as: KENALOG APPLY TOPICALLY TWICE DAILY AS NEEDED FOR ECZEMA. What changed:   how much to take  how to take this  when to take this  reasons to take this  additional instructions  Allergies  Allergen Reactions  . Latex Itching and Rash    Consultations: None  Procedures/Studies: DG Chest 2 View  Result Date: 02/04/2020 CLINICAL DATA:  Bilateral chronic anterior uveitis. EXAM: CHEST - 2 VIEW COMPARISON:  None. FINDINGS: The heart size and mediastinal contours are within normal  limits. Both lungs are clear. The visualized skeletal structures are unremarkable. IMPRESSION: Normal exam. Electronically Signed   By: Lorriane Shire M.D.   On: 02/04/2020 15:57   DG Tibia/Fibula Right  Result Date: 02/25/2020 CLINICAL DATA:  Lymphedema and leg swelling. Remote fracture. EXAM: RIGHT TIBIA AND FIBULA - 2 VIEW COMPARISON:  None. FINDINGS: Considerable deformity of the left distal tibia and fibula including partial fusion in the distal diaphysis. This appears smoothly marginated and chronic. Based on the lateral projection, there may be nonunion of the distal fibular fracture fragment with the proximal; the bony bridging appears to primarily be between the tibia and the proximal fibular fragment, with chronic additional deformity in the distal tibia. No acute fracture is observed in the tibia/fibula. Subcutaneous edema noted in the calf. Tricompartmental osteoarthritis of the knee. IMPRESSION: 1. Considerable deformity of the distal tibia and fibula, compatible with remote fractures. There may be nonunion of the distal fibular fracture fragment with the proximal. 2. Subcutaneous edema in the calf. 3. Tricompartmental osteoarthritis of the knee. Electronically Signed   By: Van Clines M.D.   On: 02/25/2020 17:29   US Venous Img Lower Bilateral (DVT)  Result Date: 03/02/2020 CLINICAL DATA:  Cellulitis right lower extremity and edema. EXAM: BILATERAL LOWER EXTREMITY VENOUS DOPPLER ULTRASOUND TECHNIQUE: Gray-scale sonography with graded compression, as well as color Doppler and duplex ultrasound were performed to evaluate the lower extremity deep venous systems from the level of the common femoral vein and including the common femoral, femoral, profunda femoral, popliteal and calf veins including the posterior tibial, peroneal and gastrocnemius veins when visible. The superficial great saphenous vein was also interrogated. Spectral Doppler was utilized to evaluate flow at rest and with  distal augmentation maneuvers in the common femoral, femoral and popliteal veins. COMPARISON:  Right lower extremity study on 02/25/2003 FINDINGS: RIGHT LOWER EXTREMITY Common Femoral Vein: No evidence of thrombus. Normal compressibility, respiratory phasicity and response to augmentation. Saphenofemoral Junction: No evidence of thrombus. Normal compressibility and flow on color Doppler imaging. Profunda Femoral Vein: No evidence of thrombus. Normal compressibility and flow on color Doppler imaging. Femoral Vein: No evidence of thrombus. Normal compressibility, respiratory phasicity and response to augmentation. Popliteal Vein: No evidence of thrombus. Normal compressibility, respiratory phasicity and response to augmentation. Calf Veins: No evidence of thrombus. Normal compressibility and flow on color Doppler imaging. Superficial Great Saphenous Vein: No evidence of thrombus. Normal compressibility. Venous Reflux:  None. Other Findings: No evidence of superficial thrombophlebitis or abnormal fluid collection. LEFT LOWER EXTREMITY Common Femoral Vein: No evidence of thrombus. Normal compressibility, respiratory phasicity and response to augmentation. Saphenofemoral Junction: No evidence of thrombus. Normal compressibility and flow on color Doppler imaging. Profunda Femoral Vein: No evidence of thrombus. Normal compressibility and flow on color Doppler imaging. Femoral Vein: No evidence of thrombus. Normal compressibility, respiratory phasicity and response to augmentation. Popliteal Vein: No evidence of thrombus. Normal compressibility, respiratory phasicity and response to augmentation. Calf Veins: The left posterior tibial vein did not appear to completely compress. There is flow present by color Doppler. Component of nonocclusive thrombus or chronic mural thrombus cannot be excluded. Superficial Great Saphenous Vein: No evidence of thrombus. Normal compressibility. Venous Reflux:  None.  Other Findings: No  evidence of superficial thrombophlebitis or abnormal fluid collection. IMPRESSION: 1. No evidence of right lower extremity deep vein thrombosis. 2. Lack of full compressibility of the left posterior tibial vein by ultrasound. Component of nonocclusive thrombus or chronic nonocclusive mural thrombus cannot be excluded. Electronically Signed   By: Aletta Edouard M.D.   On: 03/02/2020 15:59   US Venous Img Lower Unilateral Right  Result Date: 02/25/2020 CLINICAL DATA:  Right leg pain and swelling for 3 days EXAM: RIGHT LOWER EXTREMITY VENOUS DOPPLER ULTRASOUND TECHNIQUE: Gray-scale sonography with graded compression, as well as color Doppler and duplex ultrasound were performed to evaluate the lower extremity deep venous systems from the level of the common femoral vein and including the common femoral, femoral, profunda femoral, popliteal and calf veins including the posterior tibial, peroneal and gastrocnemius veins when visible. The superficial great saphenous vein was also interrogated. Spectral Doppler was utilized to evaluate flow at rest and with distal augmentation maneuvers in the common femoral, femoral and popliteal veins. COMPARISON:  None. FINDINGS: Contralateral Common Femoral Vein: Respiratory phasicity is normal and symmetric with the symptomatic side. No evidence of thrombus. Normal compressibility. Common Femoral Vein: No evidence of thrombus. Normal compressibility, respiratory phasicity and response to augmentation. Saphenofemoral Junction: No evidence of thrombus. Normal compressibility and flow on color Doppler imaging. Profunda Femoral Vein: No evidence of thrombus. Normal compressibility and flow on color Doppler imaging. Femoral Vein: No evidence of thrombus. Normal compressibility, respiratory phasicity and response to augmentation. Popliteal Vein: No evidence of thrombus. Normal compressibility, respiratory phasicity and response to augmentation. Calf Veins: No evidence of thrombus.  Normal compressibility and flow on color Doppler imaging. IMPRESSION: No evidence of deep venous thrombosis. Electronically Signed   By: Jerilynn Mages.  Shick M.D.   On: 02/25/2020 16:04   DG Foot Complete Right  Result Date: 02/25/2020 CLINICAL DATA:  Swelling and lymphedema EXAM: RIGHT FOOT COMPLETE - 3+ VIEW COMPARISON:  None. FINDINGS: Retrograde lag screw fixation between the first metatarsal and the medial cuneiform. Lag screw fixation between the medial cuneiform and the base of the second metatarsal. Presumably this was 4 Lisfranc joint stabilization. There appears to be bony bridging between the base of the second metatarsal and the middle cuneiform compatible with bony fusion. Deformities in the proximal metaphysis ease of the second through fifth metatarsals, with additional deformity distally in the shaft of the fifth metatarsal, compatible with old fractures. There is bony bridging between the bases of the second through fifth metatarsals. There also appears to be some chronic deformity along the distal metaphysis of the fourth metatarsal. I do not observe any acute fracture. Distal tibial and fibular deformity related to old fracture noted. Likely degenerative chondral thinning in the tibiotalar joint. Soft tissue swelling of the distal calf tracking into the dorsum of the foot. IMPRESSION: 1. Old fractures of the second through fifth metatarsals and distal metaphysis of the fifth metatarsal. There is bony bridging between the bases of the second through fifth metatarsals and the medial cuneiform. No definite acute bony findings. 2. Screw fixation along the medial Lisfranc joint. 3. Soft tissue swelling of the distal calf tracking into the dorsum of the foot. Electronically Signed   By: Van Clines M.D.   On: 02/25/2020 17:32    (Echo, Carotid, EGD, Colonoscopy, ERCP)    Subjective: No complaints feels great she relates no further cramping.  Discharge Exam: Vitals:   03/03/20 0422 03/03/20  0820  BP: 128/72 110/61  Pulse: 93 97  Resp:  18  Temp:  98 F (36.7 C)  SpO2: 100% 98%   Vitals:   03/02/20 2350 03/03/20 0014 03/03/20 0422 03/03/20 0820  BP: (!) 123/46  128/72 110/61  Pulse: (!) 117 (!) 110 93 97  Resp: 17   18  Temp: 98.6 F (37 C)   98 F (36.7 C)  TempSrc: Oral   Oral  SpO2: 98% 100% 100% 98%  Weight:      Height:        General: Pt is alert, awake, not in acute distress Cardiovascular: RRR, S1/S2 +, no rubs, no gallops Respiratory: CTA bilaterally, no wheezing, no rhonchi Abdominal: Soft, NT, ND, bowel sounds + Extremities: no edema, no cyanosis    The results of significant diagnostics from this hospitalization (including imaging, microbiology, ancillary and laboratory) are listed below for reference.     Microbiology: Recent Results (from the past 240 hour(s))  Novel Coronavirus, NAA ([facility])     Status: None   Collection Time: 02/24/20  3:48 PM   Specimen: Nasopharyngeal(NP) swabs in vial transport medium   NASOPHARYNGE  TESTING  Result Value Ref Range Status   SARS-CoV-2, NAA Not Detected Not Detected Final    Comment: This nucleic acid amplification test was developed and its performance characteristics determined by [employer]. Nucleic acid amplification tests include RT-PCR and TMA. This test has not been FDA cleared or approved. This test has been authorized by FDA under an Emergency Use Authorization (EUA). This test is only authorized for the duration of time the declaration that circumstances exist justifying the authorization of the emergency use of in vitro diagnostic tests for detection of SARS-CoV-2 virus and/or diagnosis of COVID-19 infection under section 564(b)(1) of the Act, 21 U.S.C. [id number]) (1), unless the authorization is terminated or revoked sooner. When diagnostic testing is negative, the possibility of a false negative result should be considered in the context of a patient's recent exposures and  the presence of clinical signs and symptoms consistent with COVID-19. An individual without symptoms of COVID-19 and who is not shedding SARS-CoV-2 virus wo uld expect to have a negative (not detected) result in this assay.   SARS-COV-2, NAA 2 DAY TAT     Status: None   Collection Time: 02/24/20  3:48 PM   NASOPHARYNGE  TESTING  Result Value Ref Range Status   SARS-CoV-2, NAA 2 DAY TAT Performed  Final  Blood culture (routine x 2)     Status: None   Collection Time: 02/25/20  6:58 PM   Specimen: BLOOD  Result Value Ref Range Status   Specimen Description BLOOD LEFT HAND  Final   Special Requests   Final    BOTTLES DRAWN AEROBIC AND ANAEROBIC Blood Culture adequate volume   Culture   Final    NO GROWTH 5 DAYS Performed at [facility], [address] [place], [place] [address]    Report Status 03/01/2020 FINAL  Final  Blood culture (routine x 2)     Status: None   Collection Time: 02/25/20  7:02 PM   Specimen: BLOOD  Result Value Ref Range Status   Specimen Description BLOOD RIGHT HAND  Final   Special Requests   Final    BOTTLES DRAWN AEROBIC AND ANAEROBIC Blood Culture adequate volume   Culture   Final    NO GROWTH 5 DAYS Performed at [facility], [address] [place], [place] [address]    Report Status 03/01/2020 FINAL  Final  Labs: BNP (last 3 results) No results for input(s): BNP in the last 8760 hours. Basic Metabolic Panel: Recent Labs  Lab 02/28/20 0355 02/29/20 0420 03/01/20 0520 03/02/20 0513 03/03/20 0344  NA 138 138 138 139 141  K 3.0* 3.6 3.5 3.6 3.7  CL 97* 97* 96* 97* 98  CO2 29 30 30 30 30   GLUCOSE 135* 134* 130* 132* 130*  BUN 18 16 19 22 23   CREATININE 0.99 0.89 1.05* 1.16* 1.34*  CALCIUM 8.3* 8.5* 8.5* 9.0 8.9  MG 1.8 1.7 1.7 1.9 1.9   Liver Function Tests: Recent Labs  Lab 02/25/20 1646  AST 22  ALT 16  ALKPHOS 61  BILITOT 0.9  PROT 7.5  ALBUMIN 3.0*   No results for input(s): LIPASE, AMYLASE in the  last 168 hours. No results for input(s): AMMONIA in the last 168 hours. CBC: Recent Labs  Lab 02/25/20 1646 02/25/20 1646 02/26/20 0924 02/27/20 0829 02/28/20 0355 02/29/20 0420 03/01/20 0520 03/02/20 0513 03/03/20 0344  WBC 15.4*   < > 12.5*   < > 10.2 10.4 9.9 9.0 9.9  NEUTROABS 12.7*  --  10.5*  --   --   --   --   --   --   HGB 10.5*   < > 10.4*   < > 9.2* 9.4* 9.7* 10.9* 10.5*  HCT 31.6*   < > 32.6*   < > 27.9* 28.6* 30.0* 33.3* 32.2*  MCV 88.0   < > 90.3   < > 87.5 87.2 90.4 89.0 90.2  PLT 221   < > 208   < > 225 282 333 383 404*   < > = values in this interval not displayed.   Cardiac Enzymes: Recent Labs  Lab 02/25/20 1646  CKTOTAL 384*   BNP: Invalid input(s): POCBNP CBG: No results for input(s): GLUCAP in the last 168 hours. D-Dimer No results for input(s): DDIMER in the last 72 hours. Hgb A1c No results for input(s): HGBA1C in the last 72 hours. Lipid Profile No results for input(s): CHOL, HDL, LDLCALC, TRIG, CHOLHDL, LDLDIRECT in the last 72 hours. Thyroid function studies No results for input(s): TSH, T4TOTAL, T3FREE, THYROIDAB in the last 72 hours.  Invalid input(s): FREET3 Anemia work up No results for input(s): VITAMINB12, FOLATE, FERRITIN, TIBC, IRON, RETICCTPCT in the last 72 hours. Urinalysis    Component Value Date/Time   COLORURINE YELLOW (A) 04/05/2019 1245   APPEARANCEUR CLEAR (A) 04/05/2019 1245   LABSPEC 1.016 04/05/2019 1245   PHURINE 5.0 04/05/2019 1245   GLUCOSEU NEGATIVE 04/05/2019 1245   HGBUR NEGATIVE 04/05/2019 1245   [place] 04/05/2019 1245   BILIRUBINUR negative 09/04/2016 1041   KETONESUR NEGATIVE 04/05/2019 1245   PROTEINUR NEGATIVE 04/05/2019 1245   UROBILINOGEN 0.2 09/04/2016 1041   NITRITE NEGATIVE 04/05/2019 1245   LEUKOCYTESUR SMALL (A) 04/05/2019 1245   Sepsis Labs Invalid input(s): PROCALCITONIN,  WBC,  LACTICIDVEN Microbiology Recent Results (from the past 240 hour(s))  Novel Coronavirus, NAA  ([facility])     Status: None   Collection Time: 02/24/20  3:48 PM   Specimen: Nasopharyngeal(NP) swabs in vial transport medium   NASOPHARYNGE  TESTING  Result Value Ref Range Status   SARS-CoV-2, NAA Not Detected Not Detected Final    Comment: This nucleic acid amplification test was developed and its performance characteristics determined by [employer]. Nucleic acid amplification tests include RT-PCR and TMA. This test has not been FDA cleared or approved. This test has been authorized by FDA under  an Emergency Use Authorization (EUA). This test is only authorized for the duration of time the declaration that circumstances exist justifying the authorization of the emergency use of in vitro diagnostic tests for detection of SARS-CoV-2 virus and/or diagnosis of COVID-19 infection under section 564(b)(1) of the Act, 21 U.S.C. [id number]) (1), unless the authorization is terminated or revoked sooner. When diagnostic testing is negative, the possibility of a false negative result should be considered in the context of a patient's recent exposures and the presence of clinical signs and symptoms consistent with COVID-19. An individual without symptoms of COVID-19 and who is not shedding SARS-CoV-2 virus wo uld expect to have a negative (not detected) result in this assay.   SARS-COV-2, NAA 2 DAY TAT     Status: None   Collection Time: 02/24/20  3:48 PM   NASOPHARYNGE  TESTING  Result Value Ref Range Status   SARS-CoV-2, NAA 2 DAY TAT Performed  Final  Blood culture (routine x 2)     Status: None   Collection Time: 02/25/20  6:58 PM   Specimen: BLOOD  Result Value Ref Range Status   Specimen Description BLOOD LEFT HAND  Final   Special Requests   Final    BOTTLES DRAWN AEROBIC AND ANAEROBIC Blood Culture adequate volume   Culture   Final    NO GROWTH 5 DAYS Performed at [facility], [address] [place], [place] [address]    Report Status 03/01/2020 FINAL   Final  Blood culture (routine x 2)     Status: None   Collection Time: 02/25/20  7:02 PM   Specimen: BLOOD  Result Value Ref Range Status   Specimen Description BLOOD RIGHT HAND  Final   Special Requests   Final    BOTTLES DRAWN AEROBIC AND ANAEROBIC Blood Culture adequate volume   Culture   Final    NO GROWTH 5 DAYS Performed at [facility], [address], [place], [place] [address]    Report Status 03/01/2020 FINAL  Final     Time coordinating discharge: Over 40 minutes  SIGNED:   Charlynne Cousins, MD  [facility] 03/03/2020, 10:18 AM Pager   If 7PM-7AM, please contact night-coverage [email or web address] Password [email or web address]

## 2020-03-03 NOTE — Plan of Care (Signed)
Patient discharged home per MD order. Prescription given to patient. All discharge instructions given and all questions answered. 

## 2020-03-06 ENCOUNTER — Telehealth: Payer: Self-pay | Admitting: Physician Assistant

## 2020-03-06 ENCOUNTER — Telehealth: Payer: Self-pay

## 2020-03-06 DIAGNOSIS — L03115 Cellulitis of right lower limb: Secondary | ICD-10-CM

## 2020-03-06 DIAGNOSIS — M79604 Pain in right leg: Secondary | ICD-10-CM

## 2020-03-06 MED ORDER — SULFAMETHOXAZOLE-TRIMETHOPRIM 400-80 MG PO TABS: 1.0000 | ORAL_TABLET | Freq: Two times a day (BID) | ORAL | 0 refills | Status: DC

## 2020-03-06 MED ORDER — TRAMADOL HCL 50 MG PO TABS: 50.0000 mg | ORAL_TABLET | Freq: Three times a day (TID) | ORAL | 0 refills | Status: AC | PRN

## 2020-03-06 NOTE — Telephone Encounter (Signed)
Sent in tramadol and bactrim to [facility]

## 2020-03-06 NOTE — Telephone Encounter (Signed)
  Patient was recently discharged from the hospital on 03/03/20.  No TCM completed, patient does not qualify for TCM services due to [employer] coverage.  Per discharge summary patient needs follow up with PCP. HFU scheduled for 03/09/20 @ 11:00 AM.

## 2020-03-06 NOTE — Telephone Encounter (Signed)
Please Review

## 2020-03-06 NOTE — Telephone Encounter (Signed)
Pt advised.   Thanks,   -Emersyn Kotarski  

## 2020-03-06 NOTE — Telephone Encounter (Signed)
Pt has an post hosp fup appt on 03/09/2020. Pt is having increase pain in right leg . Pt was given only 2 abx pills bactrim that was finished on Saturday. Pt is taking extra strength tylenol with  Relief when she is off the leg.. Pt leg is throbbing when she is  walking on the leg and no relief from tylenol. Please advice

## 2020-03-07 NOTE — Progress Notes (Signed)
Established patient visit   Patient: Autumn Burton   DOB: 26-Aug-1956   64 y.o. Female  MRN: [id number] Visit Date: 03/09/2020  Today's healthcare provider: Mar Daring, PA-C   Chief Complaint  Patient presents with  . Hospitalization Follow-up   Subjective    HPI Follow up Hospitalization  Patient was admitted to [facility] on 02/25/2020 and discharged on 03/03/2020 She was treated for Cellulitis of Right leg and mild rhabdomyolysis.  Treatment for this included gentle fluid, IV antibiotics transitioned to oral Bactrim. Lower Extremity Doppler was negative for DVT Telephone follow up was done on 03/06/2020 She reports good compliance with treatment. She reports this condition is improved. Reports that when her she doesn't have her leg elevated that she has a lot of pressure with a throbbing pain.  ----------------------------------------------------------------------------------------       Medications: Outpatient Medications Prior to Visit  Medication Sig  . acetaminophen (TYLENOL) 500 MG tablet Take 500 mg by mouth every 6 (six) hours as needed.  Marland Kitchen aspirin EC 81 MG tablet Take by mouth.  . cyclopentolate (CYCLODRYL,CYCLOGYL) 1 % ophthalmic solution Place 1 drop into both eyes See admin instructions. Administer 1 drop in left eye daily and administer 1 drop in right eye three times daily  . DUREZOL 0.05 % EMUL Place 1 drop into both eyes See admin instructions. Administer 1 drop in left eye daily and administer 1 drop in right eye three times daily  . folic acid (FOLVITE) 1 MG tablet Take 1 mg by mouth at bedtime.   . gabapentin (NEURONTIN) 100 MG capsule Take 300 mg by mouth 3 (three) times daily.   . hydrochlorothiazide (HYDRODIURIL) 25 MG tablet Take 1 tablet by mouth once daily (Patient taking differently: Take 25 mg by mouth every evening. )  . methocarbamol (ROBAXIN) 500 MG tablet Take 1 tablet (500 mg total) by mouth 4 (four) times daily.  . methotrexate 2.5  MG tablet Take 15 mg by mouth every Sunday.  . metoprolol tartrate (LOPRESSOR) 25 MG tablet Take 1 tablet by mouth once daily (Patient taking differently: Take 25 mg by mouth at bedtime. )  . mupirocin cream (BACTROBAN) 2 % Apply topically 2 (two) times daily.  Marland Kitchen olmesartan (BENICAR) 20 MG tablet Take 1 tablet by mouth once daily (Patient taking differently: Take 20 mg by mouth at bedtime. )  . sulfamethoxazole-trimethoprim (BACTRIM) 400-80 MG tablet Take 1 tablet by mouth 2 (two) times daily.  . traMADol (ULTRAM) 50 MG tablet Take 1 tablet (50 mg total) by mouth every 8 (eight) hours as needed for up to 5 days.  Marland Kitchen triamcinolone (KENALOG) 0.025 % ointment APPLY TOPICALLY TWICE DAILY AS NEEDED FOR ECZEMA. (Patient taking differently: Apply 1 application topically 2 (two) times daily as needed (eczema flares). )   No facility-administered medications prior to visit.    Review of Systems  Constitutional: Negative for fatigue and fever.  Eyes: Negative for visual disturbance.  Cardiovascular: Positive for leg swelling. Negative for chest pain and palpitations.  Gastrointestinal: Negative for abdominal pain, constipation, diarrhea, nausea and vomiting.  Neurological: Positive for weakness ("Right Leg") and numbness ("Right Leg"). Negative for dizziness and light-headedness.    Last CBC Lab Results  Component Value Date   WBC 9.9 03/03/2020   HGB 10.5 (L) 03/03/2020   HCT 32.2 (L) 03/03/2020   MCV 90.2 03/03/2020   MCH 29.4 03/03/2020   RDW 14.3 03/03/2020   PLT 404 (H) [id number]   Last metabolic panel Lab  Results  Component Value Date   GLUCOSE 130 (H) 03/03/2020   NA 141 03/03/2020   K 3.7 03/03/2020   CL 98 03/03/2020   CO2 30 03/03/2020   BUN 23 03/03/2020   CREATININE 1.34 (H) 03/03/2020   GFRNONAA 42 (L) 03/03/2020   GFRAA 48 (L) 03/03/2020   CALCIUM 8.9 03/03/2020   PROT 7.5 02/25/2020   ALBUMIN 3.0 (L) 02/25/2020   LABGLOB 3.3 10/23/2018   AGRATIO 1.2 10/23/2018    BILITOT 0.9 02/25/2020   ALKPHOS 61 02/25/2020   AST 22 02/25/2020   ALT 16 02/25/2020   ANIONGAP 13 03/03/2020   Last lipids Lab Results  Component Value Date   CHOL 173 10/23/2018   HDL 76 10/23/2018   LDLCALC 82 10/23/2018   TRIG 73 10/23/2018   CHOLHDL 2.2 10/09/2017   Last hemoglobin A1c Lab Results  Component Value Date   HGBA1C 6.3 (H) 10/23/2018       Objective    BP 104/60 (BP Location: Left Arm, Patient Position: Sitting, Cuff Size: Large)   Pulse 84   Temp (!) 97 F (36.1 C) (Temporal)   Resp 16   Wt 258 lb 9.6 oz (117.3 kg)   SpO2 99%   BMI 40.50 kg/m  BP Readings from Last 3 Encounters:  03/09/20 104/60  03/03/20 110/61  01/21/20 (!) 157/90   Wt Readings from Last 3 Encounters:  03/09/20 258 lb 9.6 oz (117.3 kg)  02/26/20 265 lb (120.2 kg)  02/25/20 265 lb (120.2 kg)      Physical Exam Vitals reviewed.  Constitutional:      General: She is not in acute distress.    Appearance: Normal appearance. She is well-developed. She is obese. She is not ill-appearing.  HENT:     Head: Normocephalic and atraumatic.  Pulmonary:     Effort: Pulmonary effort is normal. No respiratory distress.  Musculoskeletal:     Cervical back: Normal range of motion and neck supple.     Right lower leg: Edema (Continued severe lymphedema and deformity  of the RLE. redness has improved) present.  Neurological:     Mental Status: She is alert.  Psychiatric:        Mood and Affect: Mood normal.        Behavior: Behavior normal.        Thought Content: Thought content normal.        Judgment: Judgment normal.       No results found for any visits on 03/09/20.  Assessment & Plan    1. Encounter for support and coordination of transition of care Hospital records, labs, consults, and imaging all reviewed prior to patient arrival today. Telephone f/u completed on 03/06/20 and reviewed by me.   2. Traumatic rhabdomyolysis, subsequent encounter Secondary to fall. Was  very mild. Gentle rehydration. Labs will be rechecked.  - CBC w/Diff/Platelet - Basic Metabolic Panel (BMET) - CK (Creatine Kinase)  3. Fall, initial encounter Using walker. - CBC w/Diff/Platelet - Basic Metabolic Panel (BMET) - CK (Creatine Kinase)  4. Cellulitis of right lower extremity Improved. Still on Bactrim. Finishing Saturday. Advised to start using lymphedema pump for fluid and pain now that we know there is no DVT. Call if pain does not improve and may change to furosemide from HCTZ.  - CBC w/Diff/Platelet - Basic Metabolic Panel (BMET) - CK (Creatine Kinase)   No follow-ups on file.      Reynolds Bowl, PA-C, have reviewed all documentation for this  visit. The documentation on 03/09/20 for the exam, diagnosis, procedures, and orders are all accurate and complete.   Rubye Beach  [facility] [phone] (phone) [phone] (fax)  [place]

## 2020-03-09 ENCOUNTER — Ambulatory Visit (INDEPENDENT_AMBULATORY_CARE_PROVIDER_SITE_OTHER): Payer: BC Managed Care – PPO | Admitting: Physician Assistant

## 2020-03-09 ENCOUNTER — Other Ambulatory Visit: Payer: Self-pay

## 2020-03-09 ENCOUNTER — Encounter: Payer: Self-pay | Admitting: Physician Assistant

## 2020-03-09 VITALS — BP 104/60 | HR 84 | Temp 97.0°F | Resp 16 | Wt 258.6 lb

## 2020-03-09 DIAGNOSIS — W19XXXA Unspecified fall, initial encounter: Secondary | ICD-10-CM | POA: Diagnosis not present

## 2020-03-09 DIAGNOSIS — L03115 Cellulitis of right lower limb: Secondary | ICD-10-CM | POA: Diagnosis not present

## 2020-03-09 DIAGNOSIS — Z7689 Persons encountering health services in other specified circumstances: Secondary | ICD-10-CM | POA: Diagnosis not present

## 2020-03-09 DIAGNOSIS — T796XXD Traumatic ischemia of muscle, subsequent encounter: Secondary | ICD-10-CM | POA: Diagnosis not present

## 2020-03-09 NOTE — Patient Instructions (Signed)
Lymphedema  Lymphedema is swelling that is caused by the abnormal collection of lymph in the tissues under the skin. Lymph is fluid from the tissues in your body that is removed through the lymphatic system. This system is part of your body's defense system (immune system) and includes lymph nodes and lymph vessels. The lymph vessels collect and carry the excess fluid, fats, proteins, and wastes from the tissues of the body to the bloodstream. This system also works to clean and remove bacteria and waste products from the body. Lymphedema occurs when the lymphatic system is blocked. When the lymph vessels or lymph nodes are blocked or damaged, lymph does not drain properly. This causes an abnormal buildup of lymph, which leads to swelling in the affected area. This may include the trunk area, or an arm or leg. Lymphedema cannot be cured by medicines, but various methods can be used to help reduce the swelling. There are two types of lymphedema: primary lymphedema and secondary lymphedema. What are the causes? The cause of this condition depends on the type of lymphedema that you have.  Primary lymphedema is caused by the absence of lymph vessels or having abnormal lymph vessels at birth.  Secondary lymphedema occurs when lymph vessels are blocked or damaged. Secondary lymphedema is more common. Common causes of lymph vessel blockage include: ? Skin infection, such as cellulitis. ? Infection by parasites (filariasis). ? Injury. ? Radiation therapy. ? Cancer. ? Formation of scar tissue. ? Surgery. What are the signs or symptoms? Symptoms of this condition include:  Swelling of the arm or leg.  A heavy or tight feeling in the arm or leg.  Swelling of the feet, toes, or fingers. Shoes or rings may fit more tightly than before.  Redness of the skin over the affected area.  Limited movement of the affected limb.  Sensitivity to touch or discomfort in the affected limb. How is this  diagnosed? This condition may be diagnosed based on:  Your symptoms and medical history.  A physical exam.  Bioimpedance spectroscopy. In this test, painless electrical currents are used to measure fluid levels in your body.  Imaging tests, such as: ? Lymphoscintigraphy. In this test, a low dose of a radioactive substance is injected to trace the flow of lymph through the lymph vessels. ? MRI. ? CT scan. ? Duplex ultrasound. This test uses sound waves to produce images of the vessels and the blood flow on a screen. ? Lymphangiography. In this test, a contrast dye is injected into the lymph vessel to help show blockages. How is this treated? Treatment for this condition may depend on the cause of your lymphedema. Treatment may include:  Complete decongestive therapy (CDT). This is done by a certified lymphedema therapist to reduce fluid congestion. This therapy includes: ? Manual lymph drainage. This is a special massage technique that promotes lymph drainage out of a limb. ? Skin care. ? Compression wrapping of the affected area. ? Specific exercises. Certain exercises can help fluid move out of the affected limb.  Compression. Various methods may be used to apply pressure to the affected limb to reduce the swelling. They include: ? Wearing compression stockings or sleeves on the affected limb. ? Wrapping the affected limb with special bandages.  Surgery. This is usually done for severe cases only. For example, surgery may be done if you have trouble moving the limb or if the swelling does not get better with other treatments. If an underlying condition is causing the lymphedema, treatment   for that condition will be done. For example, antibiotic medicines may be used to treat an infection. Follow these instructions at home: Self-care  The affected area is more likely to become injured or infected. Take these steps to help prevent infection: ? Keep the affected area clean and  dry. ? Use approved creams or lotions to keep the skin moisturized. ? Protect your skin from cuts:  Use gloves while cooking or gardening.  Do not walk barefoot.  If you shave the affected area, use an Copy.  Do not wear tight clothes, shoes, or jewelry.  Eat a healthy diet that includes a lot of fruits and vegetables. Activity  Exercise regularly as directed by your health care provider.  Do not sit with your legs crossed.  When possible, keep the affected limb raised (elevated) above the level of your heart.  Avoid carrying things with an arm that is affected by lymphedema. General instructions  Wear compression stockings or sleeves as told by your health care provider.  Note any changes in size of the affected limb. You may be instructed to take regular measurements and keep track of them.  Take over-the-counter and prescription medicines only as told by your health care provider.  If you were prescribed an antibiotic medicine, take or apply it as told by your health care provider. Do not stop using the antibiotic even if you start to feel better.  Do not use heating pads or ice packs over the affected area.  Avoid having blood draws, IV insertions, or blood pressure checked on the affected limb.  Keep all follow-up visits as told by your health care provider. This is important. Contact a health care provider if you:  Continue to have swelling in your limb.  Have a cut that does not heal.  Have redness or pain in the affected area. Get help right away if you:  Have new swelling in your limb that comes on suddenly.  Develop purplish spots, rash or sores (lesions) on your affected limb.  Have shortness of breath.  Have a fever or chills. Summary  Lymphedema is swelling that is caused by the abnormal collection of lymph in the tissues under the skin.  Lymph is fluid from the tissues in your body that is removed through the lymphatic system. This  system collects and carries excess fluid, fats, proteins, and wastes from the tissues of the body to the bloodstream.  Lymphedema causes swelling, pain, and redness in the affected area. This may include the trunk area, or an arm or leg.  Treatment for this condition may depend on the cause of your lymphedema. Treatment may include complete decongestive therapy (CDT), compression methods, surgery, or treating the underlying cause. This information is not intended to replace advice given to you by your health care provider. Make sure you discuss any questions you have with your health care provider. Document Revised: 11/10/2017 Document Reviewed: 11/10/2017 Elsevier Patient Education  [place].   Cellulitis, Adult  Cellulitis is a skin infection. The infected area is usually warm, red, swollen, and tender. This condition occurs most often in the arms and lower legs. The infection can travel to the muscles, blood, and underlying tissue and become serious. It is very important to get treated for this condition. What are the causes? Cellulitis is caused by bacteria. The bacteria enter through a break in the skin, such as a cut, burn, insect bite, open sore, or crack. What increases the risk? This condition is more  likely to occur in people who:  Have a weak body defense system (immune system).  Have open wounds on the skin, such as cuts, burns, bites, and scrapes. Bacteria can enter the body through these open wounds.  Are older than 65 years of age.  Have diabetes.  Have a type of long-lasting (chronic) liver disease (cirrhosis) or kidney disease.  Are obese.  Have a skin condition such as: ? Itchy rash (eczema). ? Slow movement of blood in the veins (venous stasis). ? Fluid buildup below the skin (edema).  Have had radiation therapy.  Use IV drugs. What are the signs or symptoms? Symptoms of this condition include:  Redness, streaking, or spotting on the  skin.  Swollen area of the skin.  Tenderness or pain when an area of the skin is touched.  Warm skin.  A fever.  Chills.  Blisters. How is this diagnosed? This condition is diagnosed based on a medical history and physical exam. You may also have tests, including:  Blood tests.  Imaging tests. How is this treated? Treatment for this condition may include:  Medicines, such as antibiotic medicines or medicines to treat allergies (antihistamines).  Supportive care, such as rest and application of cold or warm cloths (compresses) to the skin.  Hospital care, if the condition is severe. The infection usually starts to get better within 1-2 days of treatment. Follow these instructions at home:  Medicines  Take over-the-counter and prescription medicines only as told by your health care provider.  If you were prescribed an antibiotic medicine, take it as told by your health care provider. Do not stop taking the antibiotic even if you start to feel better. General instructions  Drink enough fluid to keep your urine pale yellow.  Do not touch or rub the infected area.  Raise (elevate) the infected area above the level of your heart while you are sitting or lying down.  Apply warm or cold compresses to the affected area as told by your health care provider.  Keep all follow-up visits as told by your health care provider. This is important. These visits let your health care provider make sure a more serious infection is not developing. Contact a health care provider if:  You have a fever.  Your symptoms do not begin to improve within 1-2 days of starting treatment.  Your bone or joint underneath the infected area becomes painful after the skin has healed.  Your infection returns in the same area or another area.  You notice a swollen bump in the infected area.  You develop new symptoms.  You have a general ill feeling (malaise) with muscle aches and pains. Get help  right away if:  Your symptoms get worse.  You feel very sleepy.  You develop vomiting or diarrhea that persists.  You notice red streaks coming from the infected area.  Your red area gets larger or turns dark in color. These symptoms may represent a serious problem that is an emergency. Do not wait to see if the symptoms will go away. Get medical help right away. Call your local emergency services (911 in the [place] Do not drive yourself to the hospital. Summary  Cellulitis is a skin infection. This condition occurs most often in the arms and lower legs.  Treatment for this condition may include medicines, such as antibiotic medicines or antihistamines.  Take over-the-counter and prescription medicines only as told by your health care provider. If you were prescribed an antibiotic medicine, do not stop  taking the antibiotic even if you start to feel better.  Contact a health care provider if your symptoms do not begin to improve within 1-2 days of starting treatment or your symptoms get worse.  Keep all follow-up visits as told by your health care provider. This is important. These visits let your health care provider make sure that a more serious infection is not developing. This information is not intended to replace advice given to you by your health care provider. Make sure you discuss any questions you have with your health care provider. Document Revised: 03/19/2018 Document Reviewed: 03/19/2018 Elsevier Patient Education  [place].   Rhabdomyolysis Rhabdomyolysis is a condition that happens when muscle cells break down and release substances into the blood that can damage the kidneys. This condition happens because of damage to the muscles that move bones (skeletal muscle). When the skeletal muscles are damaged, substances inside the muscle cells go into the blood. One of these substances is a protein called myoglobin. Large amounts of myoglobin can cause kidney damage  or kidney failure. Other substances that are released by muscle cells may upset the balance of the minerals (electrolytes) in your blood. This imbalance causes your blood to have too much acid (acidosis). What are the causes? This condition is caused by muscle damage. Muscle damage often happens because of:  Using your muscles too much.  An injury that crushes or squeezes a muscle too tightly.  Using illegal drugs, mainly cocaine.  Alcohol abuse. Other possible causes include:  Prescription medicines, such as those that: ? Lower cholesterol (statins). ? Treat ADHD (attention deficit hyperactivity disorder) or help with weight loss (amphetamines). ? Treat pain (opiates).  Infections.  Muscle diseases that are passed down from parent to child (inherited).  High fever.  Heatstroke.  Not having enough fluids in your body (dehydration).  Seizures.  Surgery. What increases the risk? This condition is more likely to develop in people who:  Have a family history of muscle disease.  Take part in extreme sports, such as running in marathons.  Have diabetes.  Are older.  Abuse drugs or alcohol. What are the signs or symptoms? Symptoms of this condition vary. Some people have very few symptoms, and other people have many symptoms. The most common symptoms include:  Muscle pain and swelling.  Weak muscles.  Dark urine.  Feeling weak and tired. Other symptoms include:  Nausea and vomiting.  Fever.  Pain in the abdomen.  Pain in the joints. Symptoms of complications from this condition include:  Heart rhythm that is not normal (arrhythmia).  Seizures.  Not urinating enough because of kidney failure.  Very low blood pressure (shock). Signs of shock include dizziness, blurry vision, and clammy skin.  Bleeding that is hard to stop or control. How is this diagnosed? This condition is diagnosed based on your medical history, your symptoms, and a physical exam.  Tests may also be done, including:  Blood tests.  Urine tests to check for myoglobin. You may also have other tests to check for causes of muscle damage and to check for complications. How is this treated? Treatment for this condition helps to:  Make sure you have enough fluids in your body.  Lower the acid levels in your blood to reverse acidosis.  Protect your kidneys. Treatment may include:  Fluids and medicines given through an IV tube that is inserted into one of your veins.  Medicines to lower acidosis or to bring back the balance of the  minerals in your body.  Hemodialysis. This treatment uses an artificial kidney machine to filter your blood while you recover. You may have this if other treatments are not helping. Follow these instructions at home:   Take over-the-counter and prescription medicines only as told by your health care provider.  Rest at home until your health care provider says that you can return to your normal activities.  Drink enough fluid to keep your urine clear or pale yellow.  Do not do activities that take a lot of effort (are strenuous). Ask your health care provider what level of exercise is safe for you.  Do not abuse drugs or alcohol. If you are having problems with drug or alcohol use, ask your health care provider for help.  Keep all follow-up visits as told by your health care provider. This is important. Contact a health care provider if:  You start having symptoms of this condition after treatment. Get help right away if:  You have a seizure.  You bleed easily or cannot control bleeding.  You cannot urinate.  You have chest pain.  You have trouble breathing. This information is not intended to replace advice given to you by your health care provider. Make sure you discuss any questions you have with your health care provider. Document Revised: 10/10/2017 Document Reviewed: 08/09/2016 Elsevier Patient Education  2020 [employer].

## 2020-03-10 LAB — CBC WITH DIFFERENTIAL/PLATELET
Basophils Absolute: 0.1 10*3/uL (ref 0.0–0.2)
Basos: 1 %
EOS (ABSOLUTE): 0.2 10*3/uL (ref 0.0–0.4)
Eos: 2 %
Hematocrit: 29.3 % — ABNORMAL LOW (ref 34.0–46.6)
Hemoglobin: 9.6 g/dL — ABNORMAL LOW (ref 11.1–15.9)
Immature Grans (Abs): 0.1 10*3/uL (ref 0.0–0.1)
Immature Granulocytes: 1 %
Lymphocytes Absolute: 1.9 10*3/uL (ref 0.7–3.1)
Lymphs: 20 %
MCH: 28.7 pg (ref 26.6–33.0)
MCHC: 32.8 g/dL (ref 31.5–35.7)
MCV: 88 fL (ref 79–97)
Monocytes Absolute: 0.7 10*3/uL (ref 0.1–0.9)
Monocytes: 8 %
Neutrophils Absolute: 6.7 10*3/uL (ref 1.4–7.0)
Neutrophils: 68 %
Platelets: 510 10*3/uL — ABNORMAL HIGH (ref 150–450)
RBC: 3.34 x10E6/uL — ABNORMAL LOW (ref 3.77–5.28)
RDW: 13.2 % (ref 11.7–15.4)
WBC: 9.6 10*3/uL (ref 3.4–10.8)

## 2020-03-10 LAB — BASIC METABOLIC PANEL
BUN/Creatinine Ratio: 14 (ref 12–28)
BUN: 33 mg/dL — ABNORMAL HIGH (ref 8–27)
CO2: 24 mmol/L (ref 20–29)
Calcium: 9.5 mg/dL (ref 8.7–10.3)
Chloride: 100 mmol/L (ref 96–106)
Creatinine, Ser: 2.4 mg/dL — ABNORMAL HIGH (ref 0.57–1.00)
GFR calc Af Amer: 24 mL/min/{1.73_m2} — ABNORMAL LOW (ref >59)
GFR calc non Af Amer: 21 mL/min/{1.73_m2} — ABNORMAL LOW (ref >59)
Glucose: 157 mg/dL — ABNORMAL HIGH (ref 65–99)
Potassium: 4.7 mmol/L (ref 3.5–5.2)
Sodium: 140 mmol/L (ref 134–144)

## 2020-03-10 LAB — CK: Total CK: 41 U/L (ref 32–182)

## 2020-03-13 ENCOUNTER — Encounter: Payer: Self-pay | Admitting: Physician Assistant

## 2020-03-13 ENCOUNTER — Ambulatory Visit: Payer: BC Managed Care – PPO | Admitting: Physician Assistant

## 2020-03-13 ENCOUNTER — Other Ambulatory Visit: Payer: Self-pay

## 2020-03-13 VITALS — BP 108/70 | HR 106 | Temp 96.6°F | Resp 16 | Wt 257.0 lb

## 2020-03-13 DIAGNOSIS — L03115 Cellulitis of right lower limb: Secondary | ICD-10-CM

## 2020-03-13 DIAGNOSIS — T796XXD Traumatic ischemia of muscle, subsequent encounter: Secondary | ICD-10-CM

## 2020-03-13 DIAGNOSIS — N170 Acute kidney failure with tubular necrosis: Secondary | ICD-10-CM

## 2020-03-13 MED ORDER — SULFAMETHOXAZOLE-TRIMETHOPRIM 400-80 MG PO TABS: 1.0000 | ORAL_TABLET | Freq: Two times a day (BID) | ORAL | 0 refills | Status: DC

## 2020-03-13 NOTE — Patient Instructions (Signed)

## 2020-03-13 NOTE — Progress Notes (Signed)
Established patient visit   Patient: Autumn Burton   DOB: [birth year]   64 y.o. Female  MRN: [id number] Visit Date: 03/13/2020  Today's healthcare provider: Mar Daring, PA-C   Chief Complaint  Patient presents with  . Follow-up   Subjective    HPI Patient here to follow up labs.This is to re check her creatine. She reports that she has push fluids over the weekend. She was hospitalized with cellulitis, mild rhabdomyolysis and AKI. Her labs were checked last Thursday (03/09/20) and was found to have delayed ATN from AKI. Advised to push fluids and start using her lymphedema pump. She has been doing this and the leg has been improving.   Patient Active Problem List   Diagnosis Date Noted  . Sepsis ([place]) 03/01/2020  . Hypokalemia 03/01/2020  . Rhabdomyolysis 03/01/2020  . Rheumatoid arthritis ([place]) 03/01/2020  . Peripheral neuropathy 03/01/2020  . Cellulitis of right leg 02/25/2020  . Hx of colonic polyps   . Polyp of sigmoid colon   . Morbid obesity with BMI of 45.0-49.9, adult ([place]) 03/06/2016  . Chronic eczema 01/17/2015  . Cough due to ACE inhibitor 01/17/2015  . Essential (primary) hypertension 01/17/2015  . Secondary osteoarthritis of right ankle 01/17/2015  . Abscess or cellulitis of leg 11/25/2014  . Acquired lymphedema of leg 11/25/2014  . Infection and inflammatory reaction due to internal orthopedic device, implant, and graft ([place]) 05/01/2010  . Pseudoarthrosis of cervical spine ([place]) 01/12/2010  . Hyperthyroidism, subclinical 01/12/2010  . Tachycardia 01/12/2010  . Nonunion of fracture 01/12/2010   Past Medical History:  Diagnosis Date  . Hypertension        Medications: Outpatient Medications Prior to Visit  Medication Sig  . acetaminophen (TYLENOL) 500 MG tablet Take 500 mg by mouth every 6 (six) hours as needed.  Marland Kitchen aspirin EC 81 MG tablet Take by mouth.  . cyclopentolate (CYCLODRYL,CYCLOGYL) 1 % ophthalmic solution Place 1 drop into both  eyes See admin instructions. Administer 1 drop in left eye daily and administer 1 drop in right eye three times daily  . DUREZOL 0.05 % EMUL Place 1 drop into both eyes See admin instructions. Administer 1 drop in left eye daily and administer 1 drop in right eye three times daily  . folic acid (FOLVITE) 1 MG tablet Take 1 mg by mouth at bedtime.   . gabapentin (NEURONTIN) 100 MG capsule Take 300 mg by mouth 3 (three) times daily.   . hydrochlorothiazide (HYDRODIURIL) 25 MG tablet Take 1 tablet by mouth once daily (Patient taking differently: Take 25 mg by mouth every evening. )  . methocarbamol (ROBAXIN) 500 MG tablet Take 1 tablet (500 mg total) by mouth 4 (four) times daily.  . methotrexate 2.5 MG tablet Take 15 mg by mouth every Sunday.  . metoprolol tartrate (LOPRESSOR) 25 MG tablet Take 1 tablet by mouth once daily (Patient taking differently: Take 25 mg by mouth at bedtime. )  . mupirocin cream (BACTROBAN) 2 % Apply topically 2 (two) times daily.  Marland Kitchen olmesartan (BENICAR) 20 MG tablet Take 1 tablet by mouth once daily (Patient taking differently: Take 20 mg by mouth at bedtime. )  . triamcinolone (KENALOG) 0.025 % ointment APPLY TOPICALLY TWICE DAILY AS NEEDED FOR ECZEMA. (Patient taking differently: Apply 1 application topically 2 (two) times daily as needed (eczema flares). )  . sulfamethoxazole-trimethoprim (BACTRIM) 400-80 MG tablet Take 1 tablet by mouth 2 (two) times daily. (Patient not taking: Reported on 03/13/2020)  No facility-administered medications prior to visit.    Review of Systems  Last CBC Lab Results  Component Value Date   WBC 9.6 03/09/2020   HGB 9.6 (L) 03/09/2020   HCT 29.3 (L) 03/09/2020   MCV 88 03/09/2020   MCH 28.7 03/09/2020   RDW 13.2 03/09/2020   PLT 510 (H) [id number]   Last metabolic panel Lab Results  Component Value Date   GLUCOSE 157 (H) 03/09/2020   NA 140 03/09/2020   K 4.7 03/09/2020   CL 100 03/09/2020   CO2 24 03/09/2020   BUN 33 (H)  03/09/2020   CREATININE 2.40 (H) 03/09/2020   GFRNONAA 21 (L) 03/09/2020   GFRAA 24 (L) 03/09/2020   CALCIUM 9.5 03/09/2020   PROT 7.5 02/25/2020   ALBUMIN 3.0 (L) 02/25/2020   LABGLOB 3.3 10/23/2018   AGRATIO 1.2 10/23/2018   BILITOT 0.9 02/25/2020   ALKPHOS 61 02/25/2020   AST 22 02/25/2020   ALT 16 02/25/2020   ANIONGAP 13 03/03/2020      Objective    BP 108/70 (BP Location: Right Arm, Patient Position: Sitting, Cuff Size: Normal)   Pulse (!) 106   Temp (!) 96.6 F (35.9 C) (Temporal)   Resp 16   Wt 257 lb (116.6 kg)   BMI 40.25 kg/m  BP Readings from Last 3 Encounters:  03/13/20 108/70  03/09/20 104/60  03/03/20 110/61   Wt Readings from Last 3 Encounters:  03/13/20 257 lb (116.6 kg)  03/09/20 258 lb 9.6 oz (117.3 kg)  02/26/20 265 lb (120.2 kg)      Physical Exam Vitals reviewed.  Constitutional:      General: She is not in acute distress.    Appearance: Normal appearance. She is obese. She is not ill-appearing.  Cardiovascular:     Pulses: Normal pulses.  Musculoskeletal:     Right lower leg: Edema present.     Left lower leg: Edema present.  Skin:    General: Skin is warm and dry.     Capillary Refill: Capillary refill takes less than 2 seconds.     Findings: Erythema (rle) present.  Neurological:     Mental Status: She is alert.  Psychiatric:        Mood and Affect: Mood normal.        Thought Content: Thought content normal.       No results found for any visits on 03/13/20.  Assessment & Plan     1. Acute kidney injury (AKI) with acute tubular necrosis (ATN) (HCC) Had bump in creatinine on Thursday's labs and drop in HgB. Will recheck labs as below. I will f/u with her on Thursday 03/16/20.  - CBC w/Diff/Platelet - Renal Function Panel  2. Traumatic rhabdomyolysis, subsequent encounter CK had improved to normal, 41. See above medical treatment plan. - CBC w/Diff/Platelet - Renal Function Panel  3. Cellulitis of right lower  extremity Will give a few more days of Bactrim for continued coverage of cellulitis. Improving but one area on medial side of RLE that is still slightly red, tender to touch and warm. F/U on Thursday, 03/16/20.  - sulfamethoxazole-trimethoprim (BACTRIM) 400-80 MG tablet; Take 1 tablet by mouth 2 (two) times daily.  Dispense: 10 tablet; Refill: 0   No follow-ups on file.      Reynolds Bowl, PA-C, have reviewed all documentation for this visit. The documentation on 03/13/20 for the exam, diagnosis, procedures, and orders are all accurate and complete.   Mar Daring,  PA-C  [facility] [phone] (phone) [phone] (fax)  [place]

## 2020-03-14 LAB — CBC WITH DIFFERENTIAL/PLATELET
Basophils Absolute: 0 10*3/uL (ref 0.0–0.2)
Basos: 1 %
EOS (ABSOLUTE): 0.1 10*3/uL (ref 0.0–0.4)
Eos: 2 %
Hematocrit: 31.6 % — ABNORMAL LOW (ref 34.0–46.6)
Hemoglobin: 10.4 g/dL — ABNORMAL LOW (ref 11.1–15.9)
Immature Grans (Abs): 0 10*3/uL (ref 0.0–0.1)
Immature Granulocytes: 0 %
Lymphocytes Absolute: 1.6 10*3/uL (ref 0.7–3.1)
Lymphs: 24 %
MCH: 29 pg (ref 26.6–33.0)
MCHC: 32.9 g/dL (ref 31.5–35.7)
MCV: 88 fL (ref 79–97)
Monocytes Absolute: 0.5 10*3/uL (ref 0.1–0.9)
Monocytes: 8 %
Neutrophils Absolute: 4.2 10*3/uL (ref 1.4–7.0)
Neutrophils: 65 %
Platelets: 489 10*3/uL — ABNORMAL HIGH (ref 150–450)
RBC: 3.59 x10E6/uL — ABNORMAL LOW (ref 3.77–5.28)
RDW: 13.2 % (ref 11.7–15.4)
WBC: 6.4 10*3/uL (ref 3.4–10.8)

## 2020-03-14 LAB — RENAL FUNCTION PANEL
Albumin: 3.9 g/dL (ref 3.8–4.8)
BUN/Creatinine Ratio: 18 (ref 12–28)
BUN: 23 mg/dL (ref 8–27)
CO2: 23 mmol/L (ref 20–29)
Calcium: 10 mg/dL (ref 8.7–10.3)
Chloride: 99 mmol/L (ref 96–106)
Creatinine, Ser: 1.31 mg/dL — ABNORMAL HIGH (ref 0.57–1.00)
GFR calc Af Amer: 50 mL/min/{1.73_m2} — ABNORMAL LOW (ref >59)
GFR calc non Af Amer: 43 mL/min/{1.73_m2} — ABNORMAL LOW (ref >59)
Glucose: 112 mg/dL — ABNORMAL HIGH (ref 65–99)
Phosphorus: 4.3 mg/dL (ref 3.0–4.3)
Potassium: 4.8 mmol/L (ref 3.5–5.2)
Sodium: 138 mmol/L (ref 134–144)

## 2020-03-15 ENCOUNTER — Telehealth: Payer: Self-pay

## 2020-03-15 NOTE — Progress Notes (Deleted)
     Established patient visit   Patient: Autumn Burton   DOB: 05/12/1956   64 y.o. Female  MRN: [id number] Visit Date: 03/16/2020  Today's healthcare provider: Mar Daring, PA-C   No chief complaint on file.  Subjective    HPI    {Show patient history (optional):23778::" "}   Medications: Outpatient Medications Prior to Visit  Medication Sig  . acetaminophen (TYLENOL) 500 MG tablet Take 500 mg by mouth every 6 (six) hours as needed.  Marland Kitchen aspirin EC 81 MG tablet Take by mouth.  . cyclopentolate (CYCLODRYL,CYCLOGYL) 1 % ophthalmic solution Place 1 drop into both eyes See admin instructions. Administer 1 drop in left eye daily and administer 1 drop in right eye three times daily  . DUREZOL 0.05 % EMUL Place 1 drop into both eyes See admin instructions. Administer 1 drop in left eye daily and administer 1 drop in right eye three times daily  . folic acid (FOLVITE) 1 MG tablet Take 1 mg by mouth at bedtime.   . gabapentin (NEURONTIN) 100 MG capsule Take 300 mg by mouth 3 (three) times daily.   . hydrochlorothiazide (HYDRODIURIL) 25 MG tablet Take 1 tablet by mouth once daily (Patient taking differently: Take 25 mg by mouth every evening. )  . methocarbamol (ROBAXIN) 500 MG tablet Take 1 tablet (500 mg total) by mouth 4 (four) times daily.  . methotrexate 2.5 MG tablet Take 15 mg by mouth every Sunday.  . metoprolol tartrate (LOPRESSOR) 25 MG tablet Take 1 tablet by mouth once daily (Patient taking differently: Take 25 mg by mouth at bedtime. )  . mupirocin cream (BACTROBAN) 2 % Apply topically 2 (two) times daily.  Marland Kitchen olmesartan (BENICAR) 20 MG tablet Take 1 tablet by mouth once daily (Patient taking differently: Take 20 mg by mouth at bedtime. )  . sulfamethoxazole-trimethoprim (BACTRIM) 400-80 MG tablet Take 1 tablet by mouth 2 (two) times daily.  Marland Kitchen triamcinolone (KENALOG) 0.025 % ointment APPLY TOPICALLY TWICE DAILY AS NEEDED FOR ECZEMA. (Patient taking differently: Apply 1  application topically 2 (two) times daily as needed (eczema flares). )   No facility-administered medications prior to visit.    Review of Systems  {Show previous labs (optional):23779::" "}  Objective    There were no vitals taken for this visit. {Show previous vital signs (optional):23777::" "}  Physical Exam  ***  No results found for any visits on 03/16/20.  Assessment & Plan     ***  No follow-ups on file.      {provider attestation***:1}   Rubye Beach  [facility] [phone] (phone) [phone] (fax)  [place]

## 2020-03-15 NOTE — Telephone Encounter (Signed)
Copied from [place] [id number]. Topic: Quick Communication - Lab Results (Clinic Use ONLY) >> Mar 15, 2020  8:24 AM Lennox Solders wrote: Pt had blood work on Monday and is schedule to repeat lab tomorrow. Pt would like result and to see if she still need lab appt tomorrow >> Mar 15, 2020  8:27 AM Lennox Solders wrote: Correction does pt need to keep her appt with [facility] tomorrow

## 2020-03-16 ENCOUNTER — Ambulatory Visit: Payer: Self-pay | Admitting: Physician Assistant

## 2020-04-03 ENCOUNTER — Telehealth: Payer: Self-pay

## 2020-04-03 NOTE — Telephone Encounter (Signed)
Letter sent via my chart

## 2020-04-03 NOTE — Telephone Encounter (Signed)
Copied from [place] [id number]. Topic: General - Inquiry >> Apr 03, 2020  4:01 PM Mathis Bud wrote: Reason for CRM: Patient is requesting a call back from PCP nurse regarding PCP to write a letter to patient explaining her legs being swollen and she cannot drive.  Patient needs to the letter for sisters work due to she cannot pick up her sisters child from daycare.  Patient wanted to speak with PCP nurse regarding this before PCP writes letter.   Call back [phone]

## 2020-04-03 NOTE — Telephone Encounter (Signed)
Spoke with the patient she is asking if is possible that she can get a letter address to her saying that she was pick up her sister's grandchild from daycare but due to her illness and not able to drive she is not able to pick up her sister's grandchild anymore.  FYI:The patient reports that her sister now is needing to take her lunch at four to be able to pick her grandchild.

## 2020-04-06 ENCOUNTER — Other Ambulatory Visit: Payer: Self-pay

## 2020-04-06 ENCOUNTER — Encounter: Payer: Self-pay | Admitting: Physician Assistant

## 2020-04-06 ENCOUNTER — Ambulatory Visit (INDEPENDENT_AMBULATORY_CARE_PROVIDER_SITE_OTHER): Payer: BC Managed Care – PPO | Admitting: Physician Assistant

## 2020-04-06 VITALS — BP 128/79 | HR 87 | Temp 97.0°F | Resp 16 | Wt 264.2 lb

## 2020-04-06 DIAGNOSIS — I890 Lymphedema, not elsewhere classified: Secondary | ICD-10-CM | POA: Diagnosis not present

## 2020-04-06 DIAGNOSIS — Z1239 Encounter for other screening for malignant neoplasm of breast: Secondary | ICD-10-CM

## 2020-04-06 DIAGNOSIS — N179 Acute kidney failure, unspecified: Secondary | ICD-10-CM | POA: Insufficient documentation

## 2020-04-06 DIAGNOSIS — R609 Edema, unspecified: Secondary | ICD-10-CM | POA: Diagnosis not present

## 2020-04-06 DIAGNOSIS — R710 Precipitous drop in hematocrit: Secondary | ICD-10-CM | POA: Insufficient documentation

## 2020-04-06 DIAGNOSIS — Z87898 Personal history of other specified conditions: Secondary | ICD-10-CM | POA: Insufficient documentation

## 2020-04-06 MED ORDER — FUROSEMIDE 20 MG PO TABS: 20.0000 mg | ORAL_TABLET | Freq: Every day | ORAL | 3 refills | Status: DC

## 2020-04-06 MED ORDER — POTASSIUM CHLORIDE CRYS ER 10 MEQ PO TBCR: 10.0000 meq | EXTENDED_RELEASE_TABLET | Freq: Every day | ORAL | 1 refills | Status: DC

## 2020-04-06 NOTE — Progress Notes (Signed)
Established patient visit   Patient: Autumn Burton   DOB: [birth year]   64 y.o. Female  MRN: [id number] Visit Date: 04/06/2020  Today's healthcare provider: Mar Daring, PA-C   Chief Complaint  Patient presents with  . Follow-up    AKI   Subjective    HPI Patient here for 2 week follow up Acute kidney injury (AKI) with acute tubular necrosis (ATN).Had bump in creatinine on 04/29 labs and drop in HgB. Rechecked labs on 05/03 and Hemoglobin is improving back and Kidney function has improved. Continue to push fluids. Will recheck labs today.  Patient is concern about her weight gain. She feel like she is retaining fluid. She also asking if she should on Lasix.  Wt Readings from Last 3 Encounters:  04/06/20 264 lb 3.2 oz (119.8 kg)  03/13/20 257 lb (116.6 kg)  03/09/20 258 lb 9.6 oz (117.3 kg)    Patient Active Problem List   Diagnosis Date Noted  . History of abnormal mammogram 04/06/2020  . Retaining fluid 04/06/2020  . Decreased hemoglobin 04/06/2020  . AKI (acute kidney injury) ([place]) 04/06/2020  . History of sepsis 03/01/2020  . Hypokalemia 03/01/2020  . History of rhabdomyolysis 03/01/2020  . Rheumatoid arthritis ([place]) 03/01/2020  . Peripheral neuropathy 03/01/2020  . Hx of colonic polyps   . Polyp of sigmoid colon   . Morbidly obese ([place]) 03/06/2016  . Chronic eczema 01/17/2015  . Cough due to ACE inhibitor 01/17/2015  . Essential (primary) hypertension 01/17/2015  . Secondary osteoarthritis of right ankle 01/17/2015  . Acquired lymphedema of leg 11/25/2014  . Infection and inflammatory reaction due to internal orthopedic device, implant, and graft ([place]) 05/01/2010  . Pseudoarthrosis of cervical spine ([place]) 01/12/2010  . Hyperthyroidism, subclinical 01/12/2010  . Tachycardia 01/12/2010  . Nonunion of fracture 01/12/2010   Past Medical History:  Diagnosis Date  . Hypertension        Medications: Outpatient Medications Prior to Visit    Medication Sig  . acetaminophen (TYLENOL) 500 MG tablet Take 500 mg by mouth every 6 (six) hours as needed.  Marland Kitchen aspirin EC 81 MG tablet Take by mouth.  . cyclopentolate (CYCLODRYL,CYCLOGYL) 1 % ophthalmic solution Place 1 drop into both eyes See admin instructions. Administer 1 drop in left eye daily and administer 1 drop in right eye three times daily  . DUREZOL 0.05 % EMUL Place 1 drop into both eyes See admin instructions. Administer 1 drop in left eye daily and administer 1 drop in right eye three times daily  . folic acid (FOLVITE) 1 MG tablet Take 1 mg by mouth at bedtime.   . gabapentin (NEURONTIN) 100 MG capsule Take 300 mg by mouth 3 (three) times daily.   . hydrochlorothiazide (HYDRODIURIL) 25 MG tablet Take 1 tablet by mouth once daily (Patient taking differently: Take 25 mg by mouth every evening. )  . methocarbamol (ROBAXIN) 500 MG tablet Take 1 tablet (500 mg total) by mouth 4 (four) times daily.  . methotrexate 2.5 MG tablet Take 15 mg by mouth every Sunday.  . metoprolol tartrate (LOPRESSOR) 25 MG tablet Take 1 tablet by mouth once daily (Patient taking differently: Take 25 mg by mouth at bedtime. )  . mupirocin cream (BACTROBAN) 2 % Apply topically 2 (two) times daily.  Marland Kitchen olmesartan (BENICAR) 20 MG tablet Take 1 tablet by mouth once daily (Patient taking differently: Take 20 mg by mouth at bedtime. )  . sulfamethoxazole-trimethoprim (BACTRIM) 400-80 MG tablet Take 1  tablet by mouth 2 (two) times daily.  Marland Kitchen triamcinolone (KENALOG) 0.025 % ointment APPLY TOPICALLY TWICE DAILY AS NEEDED FOR ECZEMA. (Patient taking differently: Apply 1 application topically 2 (two) times daily as needed (eczema flares). )   No facility-administered medications prior to visit.    Review of Systems  Constitutional: Negative.   Respiratory: Negative.   Cardiovascular: Positive for leg swelling.  Neurological: Negative.   Psychiatric/Behavioral: Negative.     Last CBC Lab Results  Component  Value Date   WBC 6.9 04/06/2020   HGB 10.3 (L) 04/06/2020   HCT 31.3 (L) 04/06/2020   MCV 90 04/06/2020   MCH 29.6 04/06/2020   RDW 14.6 04/06/2020   PLT 270 [id number]   Last metabolic panel Lab Results  Component Value Date   GLUCOSE 101 (H) 04/06/2020   NA 140 04/06/2020   K 3.9 04/06/2020   CL 101 04/06/2020   CO2 22 04/06/2020   BUN 21 04/06/2020   CREATININE 1.12 (H) 04/06/2020   GFRNONAA 52 (L) 04/06/2020   GFRAA 60 04/06/2020   CALCIUM 9.6 04/06/2020   PHOS 3.5 04/06/2020   PROT 7.5 02/25/2020   ALBUMIN 4.3 04/06/2020   LABGLOB 3.3 10/23/2018   AGRATIO 1.2 10/23/2018   BILITOT 0.9 02/25/2020   ALKPHOS 61 02/25/2020   AST 22 02/25/2020   ALT 16 02/25/2020   ANIONGAP 13 03/03/2020      Objective    BP 128/79 (BP Location: Left Arm, Patient Position: Sitting, Cuff Size: Large)   Pulse 87   Temp (!) 97 F (36.1 C) (Temporal)   Resp 16   Wt 264 lb 3.2 oz (119.8 kg)   BMI 41.38 kg/m  BP Readings from Last 3 Encounters:  04/06/20 128/79  03/13/20 108/70  03/09/20 104/60   Wt Readings from Last 3 Encounters:  04/06/20 264 lb 3.2 oz (119.8 kg)  03/13/20 257 lb (116.6 kg)  03/09/20 258 lb 9.6 oz (117.3 kg)      Physical Exam Vitals reviewed.  Constitutional:      General: She is not in acute distress.    Appearance: Normal appearance. She is well-developed. She is obese. She is not diaphoretic.  Cardiovascular:     Rate and Rhythm: Normal rate and regular rhythm.     Pulses: Normal pulses.     Heart sounds: Normal heart sounds. No murmur. No friction rub. No gallop.   Pulmonary:     Effort: Pulmonary effort is normal. No respiratory distress.     Breath sounds: Normal breath sounds. No wheezing or rales.  Musculoskeletal:     Cervical back: Normal range of motion and neck supple.     Right lower leg: Edema present.     Left lower leg: Edema present.  Skin:    General: Skin is warm.     Findings: No erythema.  Neurological:     Mental Status:  She is alert.      Results for orders placed or performed in visit on 04/06/20  CBC with Differential/Platelet  Result Value Ref Range   WBC 6.9 3.4 - 10.8 x10E3/uL   RBC 3.48 (L) 3.77 - 5.28 x10E6/uL   Hemoglobin 10.3 (L) 11.1 - 15.9 g/dL   Hematocrit 31.3 (L) 34.0 - 46.6 %   MCV 90 79 - 97 fL   MCH 29.6 26.6 - 33.0 pg   MCHC 32.9 31.5 - 35.7 g/dL   RDW 14.6 11.7 - 15.4 %   Platelets 270 150 - 450 x10E3/uL  Neutrophils 67 Not Estab. %   Lymphs 26 Not Estab. %   Monocytes 2 Not Estab. %   Eos 5 Not Estab. %   Basos 0 Not Estab. %   Neutrophils Absolute 4.6 1.4 - 7.0 x10E3/uL   Lymphocytes Absolute 1.8 0.7 - 3.1 x10E3/uL   Monocytes Absolute 0.1 0.1 - 0.9 x10E3/uL   EOS (ABSOLUTE) 0.3 0.0 - 0.4 x10E3/uL   Basophils Absolute 0.0 0.0 - 0.2 x10E3/uL   Immature Granulocytes 0 Not Estab. %   Immature Grans (Abs) 0.0 0.0 - 0.1 x10E3/uL  Renal function panel  Result Value Ref Range   Glucose 101 (H) 65 - 99 mg/dL   BUN 21 8 - 27 mg/dL   Creatinine, Ser 1.12 (H) 0.57 - 1.00 mg/dL   GFR calc non Af Amer 52 (L) >59 mL/min/1.73   GFR calc Af Amer 60 >59 mL/min/1.73   BUN/Creatinine Ratio 19 12 - 28   Sodium 140 134 - 144 mmol/L   Potassium 3.9 3.5 - 5.2 mmol/L   Chloride 101 96 - 106 mmol/L   CO2 22 20 - 29 mmol/L   Calcium 9.6 8.7 - 10.3 mg/dL   Phosphorus 3.5 3.0 - 4.3 mg/dL   Albumin 4.3 3.8 - 4.8 g/dL    Assessment & Plan     1. Acquired lymphedema of leg Continue lymphedema pump. Continue Lasix 20mg  daily. May increase to 40mg  until dry weight of 257-258 pounds achieved. Take potassium with lasix.   2. AKI (acute kidney injury) ([place]) Recheck labs to make sure improving and returning to baseline.  - CBC with Differential/Platelet - Renal function panel  3. Decreased hemoglobin See above medical treatment plan. - CBC with Differential/Platelet  4. Retaining fluid Weight up from 257-258 pounds to now 264 in short period. See above medical treatment plan for #1.  -  furosemide (LASIX) 20 MG tablet; Take 1 tablet (20 mg total) by mouth daily.  Dispense: 30 tablet; Refill: 3 - potassium chloride (KLOR-CON) 10 MEQ tablet; Take 1 tablet (10 mEq total) by mouth daily. Take with furosemide  Dispense: 90 tablet; Refill: 1  5. Encounter for breast cancer screening using non-mammogram modality Breast exam today was normal. There is no family history of breast cancer. She does perform regular self breast exams. Mammogram was ordered as below. Faxed to [place].  - MM DIAG BREAST TOMO BILATERAL; Future  6. History of abnormal mammogram Personal abnormal left breast.  - MM DIAG BREAST TOMO BILATERAL; Future   No follow-ups on file.      Reynolds Bowl, PA-C, have reviewed all documentation for this visit. The documentation on 04/07/20 for the exam, diagnosis, procedures, and orders are all accurate and complete.   Rubye Beach  [facility] [phone] (phone) [phone] (fax)  [place]

## 2020-04-07 ENCOUNTER — Encounter: Payer: Self-pay | Admitting: Physician Assistant

## 2020-04-07 ENCOUNTER — Telehealth: Payer: Self-pay

## 2020-04-07 LAB — CBC WITH DIFFERENTIAL/PLATELET
Basophils Absolute: 0 10*3/uL (ref 0.0–0.2)
Basos: 0 %
EOS (ABSOLUTE): 0.3 10*3/uL (ref 0.0–0.4)
Eos: 5 %
Hematocrit: 31.3 % — ABNORMAL LOW (ref 34.0–46.6)
Hemoglobin: 10.3 g/dL — ABNORMAL LOW (ref 11.1–15.9)
Immature Grans (Abs): 0 10*3/uL (ref 0.0–0.1)
Immature Granulocytes: 0 %
Lymphocytes Absolute: 1.8 10*3/uL (ref 0.7–3.1)
Lymphs: 26 %
MCH: 29.6 pg (ref 26.6–33.0)
MCHC: 32.9 g/dL (ref 31.5–35.7)
MCV: 90 fL (ref 79–97)
Monocytes Absolute: 0.1 10*3/uL (ref 0.1–0.9)
Monocytes: 2 %
Neutrophils Absolute: 4.6 10*3/uL (ref 1.4–7.0)
Neutrophils: 67 %
Platelets: 270 10*3/uL (ref 150–450)
RBC: 3.48 x10E6/uL — ABNORMAL LOW (ref 3.77–5.28)
RDW: 14.6 % (ref 11.7–15.4)
WBC: 6.9 10*3/uL (ref 3.4–10.8)

## 2020-04-07 LAB — RENAL FUNCTION PANEL
Albumin: 4.3 g/dL (ref 3.8–4.8)
BUN/Creatinine Ratio: 19 (ref 12–28)
BUN: 21 mg/dL (ref 8–27)
CO2: 22 mmol/L (ref 20–29)
Calcium: 9.6 mg/dL (ref 8.7–10.3)
Chloride: 101 mmol/L (ref 96–106)
Creatinine, Ser: 1.12 mg/dL — ABNORMAL HIGH (ref 0.57–1.00)
GFR calc Af Amer: 60 mL/min/{1.73_m2} (ref >59)
GFR calc non Af Amer: 52 mL/min/{1.73_m2} — ABNORMAL LOW (ref >59)
Glucose: 101 mg/dL — ABNORMAL HIGH (ref 65–99)
Phosphorus: 3.5 mg/dL (ref 3.0–4.3)
Potassium: 3.9 mmol/L (ref 3.5–5.2)
Sodium: 140 mmol/L (ref 134–144)

## 2020-04-07 NOTE — Telephone Encounter (Signed)
-----   Message from Mar Daring, PA-C sent at 04/07/2020  7:22 AM EDT ----- Hemoglobin has stabilized and at baseline for you of 10.3. Platelets are back to normal. White blood cell count back to normal. Kidney function continues to improve. Creatinine is back down to 1.12 from 1.31 last time and highest number of 2.40. Continue to push fluids and stay hydrated.

## 2020-04-07 NOTE — Telephone Encounter (Signed)
Hemoglobin has stabilized and at baseline for you of 10.3. Platelets are back to normal. White blood cell count back to normal. Kidney function continues to improve. Creatinine is back down to 1.12 from 1.31 last time and highest number of 2.40. Continue to push fluids and stay hydrated.  Written by Mar Daring, PA-C on 04/07/2020 7:22 AM EDT Seen by patient Autumn Burton on 04/07/2020 10:06 AM EDT

## 2020-04-09 ENCOUNTER — Other Ambulatory Visit: Payer: Self-pay | Admitting: Physician Assistant

## 2020-04-09 DIAGNOSIS — I10 Essential (primary) hypertension: Secondary | ICD-10-CM

## 2020-04-09 NOTE — Telephone Encounter (Signed)
Requested Prescriptions  Pending Prescriptions Disp Refills  . metoprolol tartrate (LOPRESSOR) 25 MG tablet [Pharmacy Med Name: METOPROL TAR 25MG    TAB] 90 tablet 1    Sig: Take 1 tablet by mouth once daily     Cardiovascular:  Beta Blockers Passed - 04/09/2020  8:12 AM      Passed - Last BP in normal range    BP Readings from Last 1 Encounters:  04/06/20 128/79         Passed - Last Heart Rate in normal range    Pulse Readings from Last 1 Encounters:  04/06/20 87         Passed - Valid encounter within last 6 months    Recent Outpatient Visits          3 days ago Acquired lymphedema of leg   [facility] [place], [place] M, PA-C   3 weeks ago Acute kidney injury (AKI) with acute tubular necrosis (ATN) [facility])   [facility] [place], [place], PA-C   1 month ago Encounter for support and coordination of transition of care   [facility] [place], [place], [place]   1 month ago Pain and swelling of right lower extremity   [facility] [place] M, [place]   2 months ago [facility]   [place], [place], PA-C             . olmesartan (BENICAR) 20 MG tablet [Pharmacy Med Name: Olmesartan Medoxomil 20 MG Oral Tablet] 90 tablet 1    Sig: Take 1 tablet by mouth once daily     Cardiovascular:  Angiotensin Receptor Blockers Failed - 04/09/2020  8:12 AM      Failed - Cr in normal range and within 180 days    Creat  Date Value Ref Range Status  10/09/2017 0.76 0.50 - 0.99 mg/dL Final    Comment:    For patients >36 years of age, the reference limit for Creatinine is approximately 13% higher for people identified as African-American. .    Creatinine, Ser  Date Value Ref Range Status  04/06/2020 1.12 (H) 0.57 - 1.00 mg/dL Final         Passed - K in normal range and within 180 days    Potassium  Date Value Ref Range Status  04/06/2020 3.9 3.5 - 5.2 mmol/L Final  11/13/2014 3.7 3.5 -  5.1 mmol/L Final         Passed - Patient is not pregnant      Passed - Last BP in normal range    BP Readings from Last 1 Encounters:  04/06/20 128/79         Passed - Valid encounter within last 6 months    Recent Outpatient Visits          3 days ago Acquired lymphedema of leg   [facility] [place] Malling M, [place]   3 weeks ago Acute kidney injury (AKI) with acute tubular necrosis (ATN) [facility])   [facility] [place], [place], PA-C   1 month ago Encounter for support and coordination of transition of care   [place], [place]   1 month ago Pain and swelling of right lower extremity   [place], Clearnce Sorrel, [place]   2 months ago [employer]   [place], [place], [place]

## 2020-04-11 ENCOUNTER — Other Ambulatory Visit: Payer: Self-pay | Admitting: *Deleted

## 2020-04-11 ENCOUNTER — Ambulatory Visit: Payer: BC Managed Care – PPO | Admitting: Podiatry

## 2020-04-11 DIAGNOSIS — I10 Essential (primary) hypertension: Secondary | ICD-10-CM

## 2020-04-11 MED ORDER — OLMESARTAN MEDOXOMIL 20 MG PO TABS: 20.0000 mg | ORAL_TABLET | Freq: Every day | ORAL | 1 refills | Status: DC

## 2020-04-11 NOTE — Progress Notes (Signed)
Medication reordered. Original transmission to pharmacy failed.

## 2020-04-12 ENCOUNTER — Encounter: Payer: Self-pay | Admitting: Physician Assistant

## 2020-04-12 ENCOUNTER — Other Ambulatory Visit: Payer: Self-pay | Admitting: Physician Assistant

## 2020-04-12 DIAGNOSIS — I10 Essential (primary) hypertension: Secondary | ICD-10-CM

## 2020-04-12 MED ORDER — OLMESARTAN MEDOXOMIL 20 MG PO TABS: 20.0000 mg | ORAL_TABLET | Freq: Every day | ORAL | 1 refills | Status: DC

## 2020-04-12 NOTE — Telephone Encounter (Signed)
[facility] is requesting refills on Olmesartan 20  Mg.

## 2020-04-23 ENCOUNTER — Other Ambulatory Visit: Payer: Self-pay | Admitting: Physician Assistant

## 2020-04-23 DIAGNOSIS — I10 Essential (primary) hypertension: Secondary | ICD-10-CM

## 2020-05-05 ENCOUNTER — Encounter: Payer: Self-pay | Admitting: Physician Assistant

## 2020-06-01 ENCOUNTER — Telehealth: Payer: Self-pay

## 2020-06-01 NOTE — Telephone Encounter (Signed)
Copied from [place] [id number]. Topic: Complaint - Billing/Coding >> Jun 01, 2020  3:43 PM Erick Blinks wrote: Pt called and is requesting a call back to discuss billing/coding/ and insurance concerns  DOS: 03/09/2020 Best contact: [phone] VM okay

## 2020-06-07 NOTE — Telephone Encounter (Signed)
Spoke to patient and told her the visit was refiled with order of diagnosis codes changed.  cbe

## 2020-07-10 ENCOUNTER — Encounter: Payer: Self-pay | Admitting: Physician Assistant

## 2020-07-10 DIAGNOSIS — M10071 Idiopathic gout, right ankle and foot: Secondary | ICD-10-CM

## 2020-07-10 MED ORDER — INDOMETHACIN 50 MG PO CAPS: 50.0000 mg | ORAL_CAPSULE | Freq: Three times a day (TID) | ORAL | 0 refills | Status: DC

## 2020-07-10 NOTE — Telephone Encounter (Signed)
Pt calling in again regarding her gout. Please advise.

## 2020-07-15 ENCOUNTER — Other Ambulatory Visit: Payer: Self-pay | Admitting: Physician Assistant

## 2020-07-15 DIAGNOSIS — R609 Edema, unspecified: Secondary | ICD-10-CM

## 2020-07-15 NOTE — Telephone Encounter (Signed)
Requested Prescriptions  Pending Prescriptions Disp Refills  . furosemide (LASIX) 20 MG tablet [Pharmacy Med Name: Furosemide 20 MG Oral Tablet] 90 tablet 0    Sig: Take 1 tablet by mouth once daily     Cardiovascular:  Diuretics - Loop Failed - 07/15/2020  9:54 AM      Failed - Cr in normal range and within 360 days    Creat  Date Value Ref Range Status  10/09/2017 0.76 0.50 - 0.99 mg/dL Final    Comment:    For patients >65 years of age, the reference limit for Creatinine is approximately 13% higher for people identified as African-American. .    Creatinine, Ser  Date Value Ref Range Status  04/06/2020 1.12 (H) 0.57 - 1.00 mg/dL Final         Passed - K in normal range and within 360 days    Potassium  Date Value Ref Range Status  04/06/2020 3.9 3.5 - 5.2 mmol/L Final  11/13/2014 3.7 3.5 - 5.1 mmol/L Final         Passed - Ca in normal range and within 360 days    Calcium  Date Value Ref Range Status  04/06/2020 9.6 8.7 - 10.3 mg/dL Final   Calcium, Total  Date Value Ref Range Status  11/12/2014 8.5 8.5 - 10.1 mg/dL Final         Passed - Na in normal range and within 360 days    Sodium  Date Value Ref Range Status  04/06/2020 140 134 - 144 mmol/L Final  11/12/2014 138 136 - 145 mmol/L Final         Passed - Last BP in normal range    BP Readings from Last 1 Encounters:  04/06/20 128/79         Passed - Valid encounter within last 6 months    Recent Outpatient Visits          3 months ago Acquired lymphedema of leg   [place], [place]   4 months ago Acute kidney injury (AKI) with acute tubular necrosis (ATN) [facility])   [facility], Clearnce Sorrel, PA-C   4 months ago Encounter for support and coordination of transition of care   [place], [place]   4 months ago Pain and swelling of right lower extremity   [place], [place]   5  months ago [employer] eye   [place], [place], [place]

## 2020-08-18 ENCOUNTER — Other Ambulatory Visit: Payer: Self-pay | Admitting: Physician Assistant

## 2020-08-18 DIAGNOSIS — I10 Essential (primary) hypertension: Secondary | ICD-10-CM

## 2020-08-18 NOTE — Telephone Encounter (Signed)
Requested Prescriptions  Pending Prescriptions Disp Refills  . hydrochlorothiazide (HYDRODIURIL) 25 MG tablet [Pharmacy Med Name: hydroCHLOROthiazide 25 MG Oral Tablet] 90 tablet 0    Sig: Take 1 tablet by mouth once daily     Cardiovascular: Diuretics - Thiazide Failed - 08/18/2020 10:35 AM      Failed - Cr in normal range and within 360 days    Creat  Date Value Ref Range Status  10/09/2017 0.76 0.50 - 0.99 mg/dL Final    Comment:    For patients >60 years of age, the reference limit for Creatinine is approximately 13% higher for people identified as African-American. .    Creatinine, Ser  Date Value Ref Range Status  04/06/2020 1.12 (H) 0.57 - 1.00 mg/dL Final         Passed - Ca in normal range and within 360 days    Calcium  Date Value Ref Range Status  04/06/2020 9.6 8.7 - 10.3 mg/dL Final   Calcium, Total  Date Value Ref Range Status  11/12/2014 8.5 8.5 - 10.1 mg/dL Final         Passed - K in normal range and within 360 days    Potassium  Date Value Ref Range Status  04/06/2020 3.9 3.5 - 5.2 mmol/L Final  11/13/2014 3.7 3.5 - 5.1 mmol/L Final         Passed - Na in normal range and within 360 days    Sodium  Date Value Ref Range Status  04/06/2020 140 134 - 144 mmol/L Final  11/12/2014 138 136 - 145 mmol/L Final         Passed - Last BP in normal range    BP Readings from Last 1 Encounters:  04/06/20 128/79         Passed - Valid encounter within last 6 months    Recent Outpatient Visits          4 months ago Acquired lymphedema of leg   [place], [place]   5 months ago Acute kidney injury (AKI) with acute tubular necrosis (ATN) [facility])   [facility] [place], Clearnce Sorrel, PA-C   5 months ago Encounter for support and coordination of transition of care   [place], [place]   5 months ago Pain and swelling of right lower extremity   [place], [place]   7 months ago [employer] eye   [place], [place], [place]

## 2020-10-09 ENCOUNTER — Other Ambulatory Visit: Payer: Self-pay | Admitting: Physician Assistant

## 2020-10-09 DIAGNOSIS — R609 Edema, unspecified: Secondary | ICD-10-CM

## 2020-10-11 ENCOUNTER — Telehealth: Payer: Self-pay

## 2020-10-11 NOTE — Telephone Encounter (Signed)
Copied from [place] [phone]. Topic: Appointment Scheduling - Scheduling Inquiry for Clinic >> Oct 11, 2020  4:07 PM Rainey Pines A wrote: Patient would like to schedule covid booster. Please advise

## 2020-10-13 DIAGNOSIS — R21 Rash and other nonspecific skin eruption: Secondary | ICD-10-CM | POA: Diagnosis not present

## 2020-10-13 DIAGNOSIS — L089 Local infection of the skin and subcutaneous tissue, unspecified: Secondary | ICD-10-CM | POA: Diagnosis not present

## 2020-10-13 DIAGNOSIS — L304 Erythema intertrigo: Secondary | ICD-10-CM | POA: Diagnosis not present

## 2020-10-18 ENCOUNTER — Ambulatory Visit: Payer: BC Managed Care – PPO

## 2020-10-24 DIAGNOSIS — L2084 Intrinsic (allergic) eczema: Secondary | ICD-10-CM | POA: Diagnosis not present

## 2020-10-24 DIAGNOSIS — Z79899 Other long term (current) drug therapy: Secondary | ICD-10-CM | POA: Diagnosis not present

## 2020-10-24 DIAGNOSIS — L309 Dermatitis, unspecified: Secondary | ICD-10-CM | POA: Diagnosis not present

## 2020-11-08 ENCOUNTER — Ambulatory Visit: Payer: Self-pay

## 2020-11-08 ENCOUNTER — Other Ambulatory Visit: Payer: Self-pay | Admitting: Physician Assistant

## 2020-11-08 DIAGNOSIS — I10 Essential (primary) hypertension: Secondary | ICD-10-CM

## 2020-11-08 DIAGNOSIS — R609 Edema, unspecified: Secondary | ICD-10-CM

## 2020-11-15 ENCOUNTER — Telehealth (INDEPENDENT_AMBULATORY_CARE_PROVIDER_SITE_OTHER): Payer: PPO | Admitting: Physician Assistant

## 2020-11-15 ENCOUNTER — Encounter: Payer: Self-pay | Admitting: Physician Assistant

## 2020-11-15 DIAGNOSIS — M25562 Pain in left knee: Secondary | ICD-10-CM

## 2020-11-15 DIAGNOSIS — I10 Essential (primary) hypertension: Secondary | ICD-10-CM | POA: Diagnosis not present

## 2020-11-15 DIAGNOSIS — G8929 Other chronic pain: Secondary | ICD-10-CM | POA: Diagnosis not present

## 2020-11-15 DIAGNOSIS — R609 Edema, unspecified: Secondary | ICD-10-CM | POA: Diagnosis not present

## 2020-11-15 DIAGNOSIS — M25561 Pain in right knee: Secondary | ICD-10-CM

## 2020-11-15 MED ORDER — METOPROLOL TARTRATE 25 MG PO TABS: 25.0000 mg | ORAL_TABLET | Freq: Every day | ORAL | 1 refills | Status: DC

## 2020-11-15 MED ORDER — OLMESARTAN MEDOXOMIL 20 MG PO TABS: 20.0000 mg | ORAL_TABLET | Freq: Every day | ORAL | 1 refills | Status: DC

## 2020-11-15 MED ORDER — POTASSIUM CHLORIDE ER 10 MEQ PO TBCR: EXTENDED_RELEASE_TABLET | ORAL | 1 refills | Status: AC

## 2020-11-15 MED ORDER — HYDROCHLOROTHIAZIDE 25 MG PO TABS: 25.0000 mg | ORAL_TABLET | Freq: Every day | ORAL | 1 refills | Status: DC

## 2020-11-15 MED ORDER — FUROSEMIDE 20 MG PO TABS: 20.0000 mg | ORAL_TABLET | Freq: Every day | ORAL | 1 refills | Status: DC

## 2020-11-15 NOTE — Patient Instructions (Signed)
DASH Eating Plan DASH stands for "Dietary Approaches to Stop Hypertension." The DASH eating plan is a healthy eating plan that has been shown to reduce high blood pressure (hypertension). It may also reduce your risk for type 2 diabetes, heart disease, and stroke. The DASH eating plan may also help with weight loss. What are tips for following this plan?  General guidelines  Avoid eating more than 2,300 mg (milligrams) of salt (sodium) a day. If you have hypertension, you may need to reduce your sodium intake to 1,500 mg a day.  Limit alcohol intake to no more than 1 drink a day for nonpregnant women and 2 drinks a day for men. One drink equals 12 oz of beer, 5 oz of wine, or 1 oz of hard liquor.  Work with your health care provider to maintain a healthy body weight or to lose weight. Ask what an ideal weight is for you.  Get at least 30 minutes of exercise that causes your heart to beat faster (aerobic exercise) most days of the week. Activities may include walking, swimming, or biking.  Work with your health care provider or diet and nutrition specialist (dietitian) to adjust your eating plan to your individual calorie needs. Reading food labels   Check food labels for the amount of sodium per serving. Choose foods with less than 5 percent of the Daily Value of sodium. Generally, foods with less than 300 mg of sodium per serving fit into this eating plan.  To find whole grains, look for the word "whole" as the first word in the ingredient list. Shopping  Buy products labeled as "low-sodium" or "no salt added."  Buy fresh foods. Avoid canned foods and premade or frozen meals. Cooking  Avoid adding salt when cooking. Use salt-free seasonings or herbs instead of table salt or sea salt. Check with your health care provider or pharmacist before using salt substitutes.  Do not fry foods. Cook foods using healthy methods such as baking, boiling, grilling, and broiling instead.  Cook with  heart-healthy oils, such as olive, canola, soybean, or sunflower oil. Meal planning  Eat a balanced diet that includes: ? 5 or more servings of fruits and vegetables each day. At each meal, try to fill half of your plate with fruits and vegetables. ? Up to 6-8 servings of whole grains each day. ? Less than 6 oz of lean meat, poultry, or fish each day. A 3-oz serving of meat is about the same size as a deck of cards. One egg equals 1 oz. ? 2 servings of low-fat dairy each day. ? A serving of nuts, seeds, or beans 5 times each week. ? Heart-healthy fats. Healthy fats called Omega-3 fatty acids are found in foods such as flaxseeds and coldwater fish, like sardines, salmon, and mackerel.  Limit how much you eat of the following: ? Canned or prepackaged foods. ? Food that is high in trans fat, such as fried foods. ? Food that is high in saturated fat, such as fatty meat. ? Sweets, desserts, sugary drinks, and other foods with added sugar. ? Full-fat dairy products.  Do not salt foods before eating.  Try to eat at least 2 vegetarian meals each week.  Eat more home-cooked food and less restaurant, buffet, and fast food.  When eating at a restaurant, ask that your food be prepared with less salt or no salt, if possible. What foods are recommended? The items listed may not be a complete list. Talk with your dietitian about   what dietary choices are best for you. Grains Whole-grain or whole-wheat bread. Whole-grain or whole-wheat pasta. Brown rice. Oatmeal. Quinoa. Bulgur. Whole-grain and low-sodium cereals. Pita bread. Low-fat, low-sodium crackers. Whole-wheat flour tortillas. Vegetables Fresh or frozen vegetables (raw, steamed, roasted, or grilled). Low-sodium or reduced-sodium tomato and vegetable juice. Low-sodium or reduced-sodium tomato sauce and tomato paste. Low-sodium or reduced-sodium canned vegetables. Fruits All fresh, dried, or frozen fruit. Canned fruit in natural juice (without  added sugar). Meat and other protein foods Skinless chicken or turkey. Ground chicken or turkey. Pork with fat trimmed off. Fish and seafood. Egg whites. Dried beans, peas, or lentils. Unsalted nuts, nut butters, and seeds. Unsalted canned beans. Lean cuts of beef with fat trimmed off. Low-sodium, lean deli meat. Dairy Low-fat (1%) or fat-free (skim) milk. Fat-free, low-fat, or reduced-fat cheeses. Nonfat, low-sodium ricotta or cottage cheese. Low-fat or nonfat yogurt. Low-fat, low-sodium cheese. Fats and oils Soft margarine without trans fats. Vegetable oil. Low-fat, reduced-fat, or light mayonnaise and salad dressings (reduced-sodium). Canola, safflower, olive, soybean, and sunflower oils. Avocado. Seasoning and other foods Herbs. Spices. Seasoning mixes without salt. Unsalted popcorn and pretzels. Fat-free sweets. What foods are not recommended? The items listed may not be a complete list. Talk with your dietitian about what dietary choices are best for you. Grains Baked goods made with fat, such as croissants, muffins, or some breads. Dry pasta or rice meal packs. Vegetables Creamed or fried vegetables. Vegetables in a cheese sauce. Regular canned vegetables (not low-sodium or reduced-sodium). Regular canned tomato sauce and paste (not low-sodium or reduced-sodium). Regular tomato and vegetable juice (not low-sodium or reduced-sodium). Pickles. Olives. Fruits Canned fruit in a light or heavy syrup. Fried fruit. Fruit in cream or butter sauce. Meat and other protein foods Fatty cuts of meat. Ribs. Fried meat. Bacon. Sausage. Bologna and other processed lunch meats. Salami. Fatback. Hotdogs. Bratwurst. Salted nuts and seeds. Canned beans with added salt. Canned or smoked fish. Whole eggs or egg yolks. Chicken or turkey with skin. Dairy Whole or 2% milk, cream, and half-and-half. Whole or full-fat cream cheese. Whole-fat or sweetened yogurt. Full-fat cheese. Nondairy creamers. Whipped toppings.  Processed cheese and cheese spreads. Fats and oils Butter. Stick margarine. Lard. Shortening. Ghee. Bacon fat. Tropical oils, such as coconut, palm kernel, or palm oil. Seasoning and other foods Salted popcorn and pretzels. Onion salt, garlic salt, seasoned salt, table salt, and sea salt. Worcestershire sauce. Tartar sauce. Barbecue sauce. Teriyaki sauce. Soy sauce, including reduced-sodium. Steak sauce. Canned and packaged gravies. Fish sauce. Oyster sauce. Cocktail sauce. Horseradish that you find on the shelf. Ketchup. Mustard. Meat flavorings and tenderizers. Bouillon cubes. Hot sauce and Tabasco sauce. Premade or packaged marinades. Premade or packaged taco seasonings. Relishes. Regular salad dressings. Where to find more information:  National Heart, Lung, and Blood Institute: www.nhlbi.nih.gov  American Heart Association: www.heart.org Summary  The DASH eating plan is a healthy eating plan that has been shown to reduce high blood pressure (hypertension). It may also reduce your risk for type 2 diabetes, heart disease, and stroke.  With the DASH eating plan, you should limit salt (sodium) intake to 2,300 mg a day. If you have hypertension, you may need to reduce your sodium intake to 1,500 mg a day.  When on the DASH eating plan, aim to eat more fresh fruits and vegetables, whole grains, lean proteins, low-fat dairy, and heart-healthy fats.  Work with your health care provider or diet and nutrition specialist (dietitian) to adjust your eating plan to your   individual calorie needs. This information is not intended to replace advice given to you by your health care provider. Make sure you discuss any questions you have with your health care provider. Document Revised: 10/10/2017 Document Reviewed: 10/21/2016 Elsevier Patient Education  2020 Elsevier Inc.  
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
## 2020-11-22 DIAGNOSIS — M170 Bilateral primary osteoarthritis of knee: Secondary | ICD-10-CM | POA: Diagnosis not present

## 2020-11-23 ENCOUNTER — Encounter: Payer: Self-pay | Admitting: Physician Assistant

## 2020-12-03 ENCOUNTER — Other Ambulatory Visit: Payer: Self-pay | Admitting: Physician Assistant

## 2020-12-03 DIAGNOSIS — I10 Essential (primary) hypertension: Secondary | ICD-10-CM

## 2021-01-05 ENCOUNTER — Encounter: Payer: Self-pay | Admitting: Physician Assistant

## 2021-01-25 ENCOUNTER — Ambulatory Visit: Payer: BC Managed Care – PPO | Admitting: Physician Assistant

## 2021-01-26 ENCOUNTER — Other Ambulatory Visit: Payer: Self-pay

## 2021-01-26 ENCOUNTER — Telehealth (INDEPENDENT_AMBULATORY_CARE_PROVIDER_SITE_OTHER): Payer: PPO | Admitting: Physician Assistant

## 2021-01-26 ENCOUNTER — Telehealth: Payer: Self-pay

## 2021-01-26 DIAGNOSIS — M7989 Other specified soft tissue disorders: Secondary | ICD-10-CM

## 2021-01-26 DIAGNOSIS — I890 Lymphedema, not elsewhere classified: Secondary | ICD-10-CM

## 2021-01-26 DIAGNOSIS — N179 Acute kidney failure, unspecified: Secondary | ICD-10-CM

## 2021-01-26 DIAGNOSIS — N3946 Mixed incontinence: Secondary | ICD-10-CM

## 2021-01-26 NOTE — Telephone Encounter (Signed)
Copied from [place] [phone]. Topic: Appointment Scheduling - Scheduling Inquiry for Clinic >> Jan 26, 2021 11:03 AM Yvette Rack wrote: Reason for CRM: Pt called for an update as she is waiting to have [facility] appt. Pt advised to continue to wait for provider to appear

## 2021-01-26 NOTE — Progress Notes (Signed)
MyChart Video Visit    Virtual Visit via Video Note   This visit type was conducted due to national recommendations for restrictions regarding the COVID-19 Pandemic (e.g. social distancing) in an effort to limit this patient's exposure and mitigate transmission in our community. This patient is at least at moderate risk for complications without adequate follow up. This format is felt to be most appropriate for this patient at this time. Physical exam was limited by quality of the video and audio technology used for the visit.   Interactive audio and video communications were attempted, although failed due to patient's inability to connect to video. Continued visit with audio only interaction with patient agreement.  Patient location: Home Provider location: [facility]  I discussed the limitations of evaluation and management by telemedicine and the availability of in person appointments. The patient expressed understanding and agreed to proceed.  Patient: Autumn Burton   DOB: Apr 22, 1956   65 y.o. Female  MRN: [id number] Visit Date: 01/26/2021  Today's healthcare provider: Mar Daring, PA-C   No chief complaint on file.  Subjective    HPI  Autumn Burton is a 65 yr old female that presents today via telephone visit for worsening leg swelling and cellulitis. Also has a h/o AKI with previous cellulitis. Starting to feel like she did at that time when she had to be hospitalized. Wanting to check labs.  Patient Active Problem List   Diagnosis Date Noted  . Chronic pain of both knees 11/15/2020  . History of abnormal mammogram 04/06/2020  . Retaining fluid 04/06/2020  . Decreased hemoglobin 04/06/2020  . AKI (acute kidney injury) ([place]) 04/06/2020  . History of sepsis 03/01/2020  . Hypokalemia 03/01/2020  . History of rhabdomyolysis 03/01/2020  . Rheumatoid arthritis ([place]) 03/01/2020  . Peripheral neuropathy 03/01/2020  . Hx of colonic polyps   . Polyp of sigmoid colon   .  Morbidly obese ([place]) 03/06/2016  . Chronic eczema 01/17/2015  . Cough due to ACE inhibitor 01/17/2015  . Essential (primary) hypertension 01/17/2015  . Secondary osteoarthritis of right ankle 01/17/2015  . Acquired lymphedema of leg 11/25/2014  . Infection and inflammatory reaction due to internal orthopedic device, implant, and graft ([place]) 05/01/2010  . Pseudoarthrosis of cervical spine ([place]) 01/12/2010  . Hyperthyroidism, subclinical 01/12/2010  . Tachycardia 01/12/2010  . Nonunion of fracture 01/12/2010   Past Medical History:  Diagnosis Date  . Hypertension       Medications: Outpatient Medications Prior to Visit  Medication Sig  . acetaminophen (TYLENOL) 500 MG tablet Take 500 mg by mouth every 6 (six) hours as needed.  Marland Kitchen aspirin EC 81 MG tablet Take by mouth.  . folic acid (FOLVITE) 1 MG tablet Take 1 mg by mouth at bedtime.   . furosemide (LASIX) 20 MG tablet Take 1 tablet (20 mg total) by mouth daily.  Marland Kitchen gabapentin (NEURONTIN) 100 MG capsule Take 300 mg by mouth 3 (three) times daily.   . hydrochlorothiazide (HYDRODIURIL) 25 MG tablet Take 1 tablet (25 mg total) by mouth daily.  . indomethacin (INDOCIN) 50 MG capsule Take 1 capsule (50 mg total) by mouth 3 (three) times daily with meals. Until gout pain subsides (Patient not taking: Reported on 11/15/2020)  . methocarbamol (ROBAXIN) 500 MG tablet Take 1 tablet (500 mg total) by mouth 4 (four) times daily. (Patient not taking: Reported on 11/15/2020)  . methotrexate 2.5 MG tablet Take 15 mg by mouth every Sunday.  . metoprolol tartrate (LOPRESSOR) 25 MG  tablet Take 1 tablet (25 mg total) by mouth daily.  . mupirocin cream (BACTROBAN) 2 % Apply topically 2 (two) times daily.  Marland Kitchen olmesartan (BENICAR) 20 MG tablet Take 1 tablet (20 mg total) by mouth daily.  . potassium chloride (KLOR-CON) 10 MEQ tablet TAKE 1 TABLET BY MOUTH ONCE DAILY. TAKE WITH FUROSEMIDE.  Marland Kitchen triamcinolone (KENALOG) 0.025 % ointment APPLY TOPICALLY TWICE DAILY AS  NEEDED FOR ECZEMA. (Patient taking differently: Apply 1 application topically 2 (two) times daily as needed (eczema flares).)  . [DISCONTINUED] cyclopentolate (CYCLODRYL,CYCLOGYL) 1 % ophthalmic solution Place 1 drop into both eyes See admin instructions. Administer 1 drop in left eye daily and administer 1 drop in right eye three times daily (Patient not taking: Reported on 11/15/2020)  . [DISCONTINUED] DUREZOL 0.05 % EMUL Place 1 drop into both eyes See admin instructions. Administer 1 drop in left eye daily and administer 1 drop in right eye three times daily (Patient not taking: Reported on 11/15/2020)   No facility-administered medications prior to visit.    Review of Systems  Last CBC Lab Results  Component Value Date   WBC 7.4 01/29/2021   HGB 11.0 (L) 01/29/2021   HCT 32.9 (L) 01/29/2021   MCV 91 01/29/2021   MCH 30.6 01/29/2021   RDW 13.7 01/29/2021   PLT 306 97/98/9211   Last metabolic panel Lab Results  Component Value Date   GLUCOSE 104 (H) 01/29/2021   NA 138 01/29/2021   K 4.3 01/29/2021   CL 99 01/29/2021   CO2 20 01/29/2021   BUN 31 (H) 01/29/2021   CREATININE 1.40 (H) 01/29/2021   GFRNONAA 52 (L) 04/06/2020   GFRAA 60 04/06/2020   CALCIUM 10.0 01/29/2021   PHOS 3.5 04/06/2020   PROT 8.1 01/29/2021   ALBUMIN 4.5 01/29/2021   LABGLOB 3.6 01/29/2021   AGRATIO 1.3 01/29/2021   BILITOT 0.3 01/29/2021   ALKPHOS 95 01/29/2021   AST 12 01/29/2021   ALT 9 01/29/2021   ANIONGAP 13 03/03/2020      Objective    There were no vitals taken for this visit. BP Readings from Last 3 Encounters:  04/06/20 128/79  03/13/20 108/70  03/09/20 104/60   Wt Readings from Last 3 Encounters:  04/06/20 264 lb 3.2 oz (119.8 kg)  03/13/20 257 lb (116.6 kg)  03/09/20 258 lb 9.6 oz (117.3 kg)      Physical Exam     Assessment & Plan     1. Leg swelling Patient reports starting to worsen again. Worried it may be her kidneys again. Interested in labs. Will check labs as  below and f/u pending results. - CBC w/Diff/Platelet - Comprehensive Metabolic Panel (CMET) - HgB A1c - B Nat Peptide  2. Mixed stress and urge urinary incontinence Having increasing urinary incontinence. Chronic issue with recent worsening. Will check labs as below and f/u pending results. - CBC w/Diff/Platelet - Urinalysis, Routine w reflex microscopic - Urine Culture  3. Acquired lymphedema of leg See above medical treatment plan. - Comprehensive Metabolic Panel (CMET)  4. AKI (acute kidney injury) ([place]) Will check labs as below and f/u pending results. - Comprehensive Metabolic Panel (CMET)   No follow-ups on file.     I discussed the assessment and treatment plan with the patient. The patient was provided an opportunity to ask questions and all were answered. The patient agreed with the plan and demonstrated an understanding of the instructions.   The patient was advised to call back or seek an  in-person evaluation if the symptoms worsen or if the condition fails to improve as anticipated.  I provided 18 minutes of non-face-to-face time during this encounter.  Reynolds Bowl, PA-C, have reviewed all documentation for this visit. The documentation on 02/13/21 for the exam, diagnosis, procedures, and orders are all accurate and complete.  [place] [facility] [phone] (phone) [phone] (fax)  [place]

## 2021-01-29 DIAGNOSIS — I890 Lymphedema, not elsewhere classified: Secondary | ICD-10-CM | POA: Diagnosis not present

## 2021-01-29 DIAGNOSIS — M7989 Other specified soft tissue disorders: Secondary | ICD-10-CM | POA: Diagnosis not present

## 2021-01-29 DIAGNOSIS — N3946 Mixed incontinence: Secondary | ICD-10-CM | POA: Diagnosis not present

## 2021-01-29 DIAGNOSIS — N179 Acute kidney failure, unspecified: Secondary | ICD-10-CM | POA: Diagnosis not present

## 2021-02-02 ENCOUNTER — Other Ambulatory Visit: Payer: Self-pay | Admitting: Physician Assistant

## 2021-02-02 ENCOUNTER — Encounter: Payer: Self-pay | Admitting: Physician Assistant

## 2021-02-02 DIAGNOSIS — N3000 Acute cystitis without hematuria: Secondary | ICD-10-CM

## 2021-02-02 LAB — CBC WITH DIFFERENTIAL/PLATELET
Basophils Absolute: 0 10*3/uL (ref 0.0–0.2)
Basos: 0 %
EOS (ABSOLUTE): 0.2 10*3/uL (ref 0.0–0.4)
Eos: 3 %
Hematocrit: 32.9 % — ABNORMAL LOW (ref 34.0–46.6)
Hemoglobin: 11 g/dL — ABNORMAL LOW (ref 11.1–15.9)
Immature Grans (Abs): 0 10*3/uL (ref 0.0–0.1)
Immature Granulocytes: 0 %
Lymphocytes Absolute: 1.4 10*3/uL (ref 0.7–3.1)
Lymphs: 19 %
MCH: 30.6 pg (ref 26.6–33.0)
MCHC: 33.4 g/dL (ref 31.5–35.7)
MCV: 91 fL (ref 79–97)
Monocytes Absolute: 0.7 10*3/uL (ref 0.1–0.9)
Monocytes: 9 %
Neutrophils Absolute: 5 10*3/uL (ref 1.4–7.0)
Neutrophils: 69 %
Platelets: 306 10*3/uL (ref 150–450)
RBC: 3.6 x10E6/uL — ABNORMAL LOW (ref 3.77–5.28)
RDW: 13.7 % (ref 11.7–15.4)
WBC: 7.4 10*3/uL (ref 3.4–10.8)

## 2021-02-02 LAB — URINE CULTURE

## 2021-02-02 LAB — URINALYSIS, ROUTINE W REFLEX MICROSCOPIC
Bilirubin, UA: NEGATIVE
Glucose, UA: NEGATIVE
Ketones, UA: NEGATIVE
Nitrite, UA: NEGATIVE
Protein,UA: NEGATIVE
Specific Gravity, UA: 1.016 (ref 1.005–1.030)
Urobilinogen, Ur: 0.2 mg/dL (ref 0.2–1.0)
pH, UA: 5.5 (ref 5.0–7.5)

## 2021-02-02 LAB — COMPREHENSIVE METABOLIC PANEL
ALT: 9 IU/L (ref 0–32)
AST: 12 IU/L (ref 0–40)
Albumin/Globulin Ratio: 1.3 (ref 1.2–2.2)
Albumin: 4.5 g/dL (ref 3.8–4.8)
Alkaline Phosphatase: 95 IU/L (ref 44–121)
BUN/Creatinine Ratio: 22 (ref 12–28)
BUN: 31 mg/dL — ABNORMAL HIGH (ref 8–27)
Bilirubin Total: 0.3 mg/dL (ref 0.0–1.2)
CO2: 20 mmol/L (ref 20–29)
Calcium: 10 mg/dL (ref 8.7–10.3)
Chloride: 99 mmol/L (ref 96–106)
Creatinine, Ser: 1.4 mg/dL — ABNORMAL HIGH (ref 0.57–1.00)
Globulin, Total: 3.6 g/dL (ref 1.5–4.5)
Glucose: 104 mg/dL — ABNORMAL HIGH (ref 65–99)
Potassium: 4.3 mmol/L (ref 3.5–5.2)
Sodium: 138 mmol/L (ref 134–144)
Total Protein: 8.1 g/dL (ref 6.0–8.5)
eGFR: 42 mL/min/{1.73_m2} — ABNORMAL LOW (ref >59)

## 2021-02-02 LAB — HEMOGLOBIN A1C
Est. average glucose Bld gHb Est-mCnc: 146 mg/dL
Hgb A1c MFr Bld: 6.7 % — ABNORMAL HIGH (ref 4.8–5.6)

## 2021-02-02 LAB — BRAIN NATRIURETIC PEPTIDE: BNP: 13.3 pg/mL (ref 0.0–100.0)

## 2021-02-02 LAB — MICROSCOPIC EXAMINATION
Bacteria, UA: NONE SEEN
Casts: NONE SEEN /lpf
RBC, Urine: NONE SEEN /hpf (ref 0–2)
WBC, UA: >30 /hpf — AB (ref 0–5)

## 2021-02-02 MED ORDER — NITROFURANTOIN MONOHYD MACRO 100 MG PO CAPS: 100.0000 mg | ORAL_CAPSULE | Freq: Two times a day (BID) | ORAL | 0 refills | Status: DC

## 2021-02-07 ENCOUNTER — Telehealth: Payer: Self-pay

## 2021-02-07 NOTE — Telephone Encounter (Signed)
I am not sure what exactly her concern is.  I would like to recheck her labs (kidney function) in 2-3 weeks. I will order that now and she can come have drawn at her convenience.   She also needs a 3 month f/u for T2DM.

## 2021-02-07 NOTE — Telephone Encounter (Signed)
Copied from [place] [id number]. Topic: General - Other >> Feb 07, 2021  1:46 PM Tessa Lerner A wrote: Reason for CRM: Patient would like to be contacted by a member of staff to discuss bloodwork  Patient is uncertain of what to do regarding bloodwork as well as appointment scheduling due to the imminent departure of their PCP  Please contact to advise further when possible

## 2021-02-08 NOTE — Telephone Encounter (Signed)
Pt advised.  She states she is establishing care with [facility] at the end of April.  She will get her repeat labs here.   Thanks,   -Mickel Baas

## 2021-02-13 ENCOUNTER — Encounter: Payer: Self-pay | Admitting: Physician Assistant

## 2021-02-17 IMAGING — US US EXTREM LOW VENOUS
1 series · 12 of 24 positions shown · non-contrast
Comparison: Right lower extremity study on 02/25/2003

CLINICAL DATA: Cellulitis right lower extremity and edema.



[Series 1: us venous img lower bilat (dvt) · portal-venous · 12 of 61 slices shown]
[im 3/61]
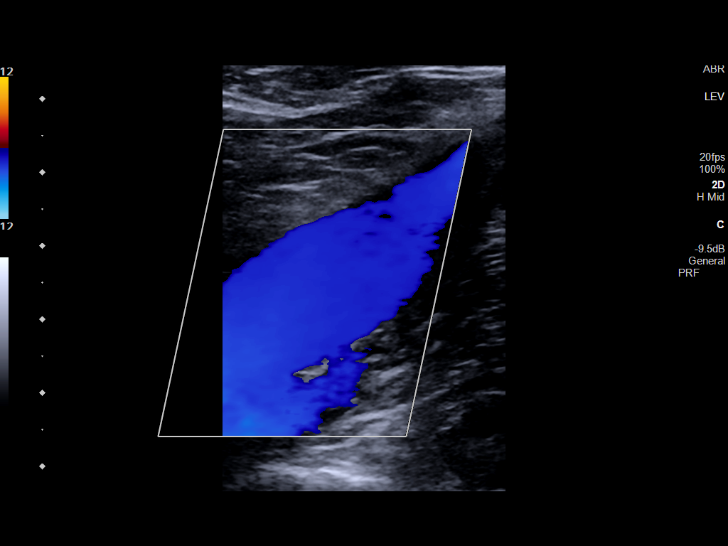
[im 8/61]
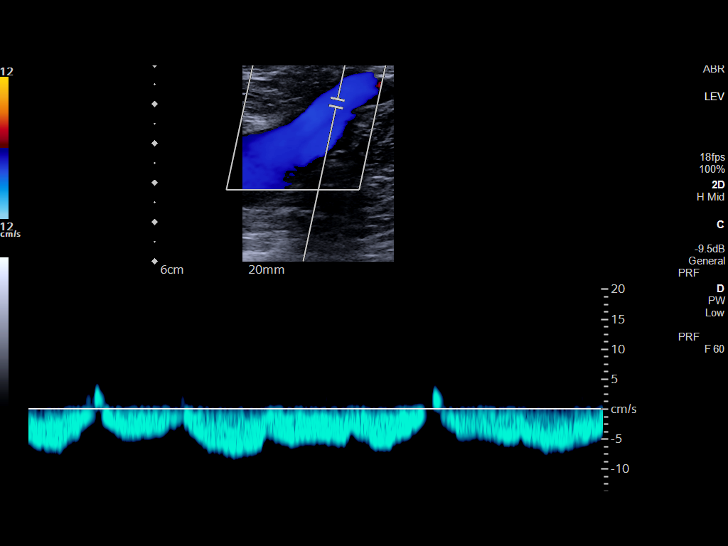
[im 14/61]
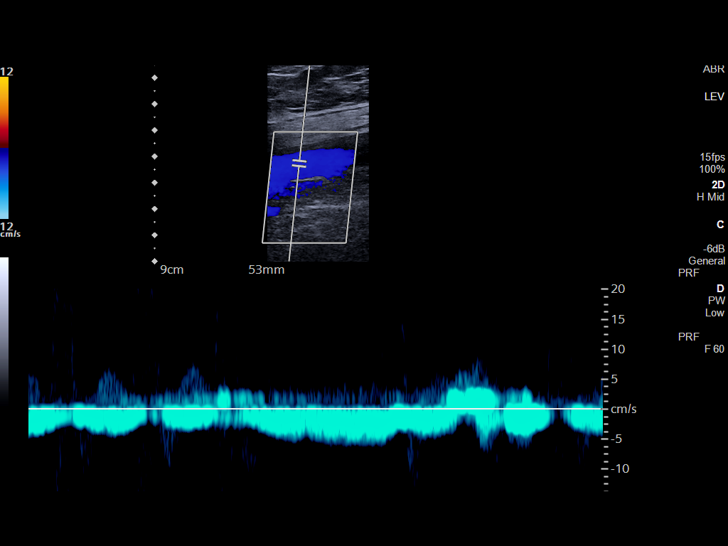
[im 19/61]
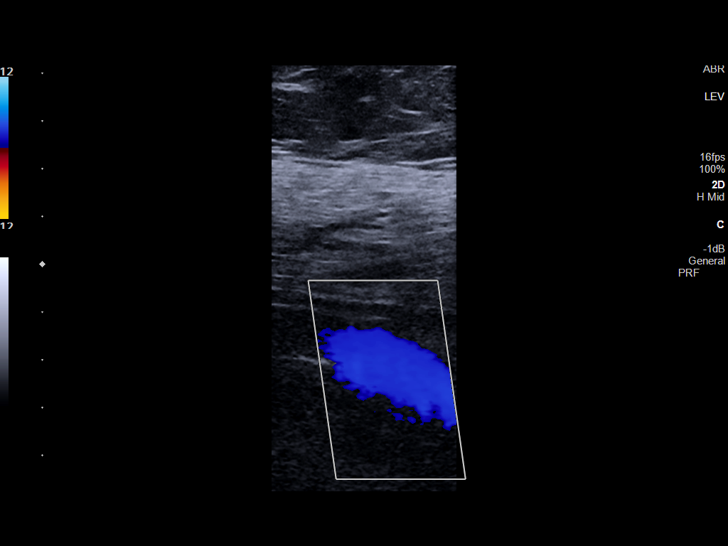
[im 24/61]
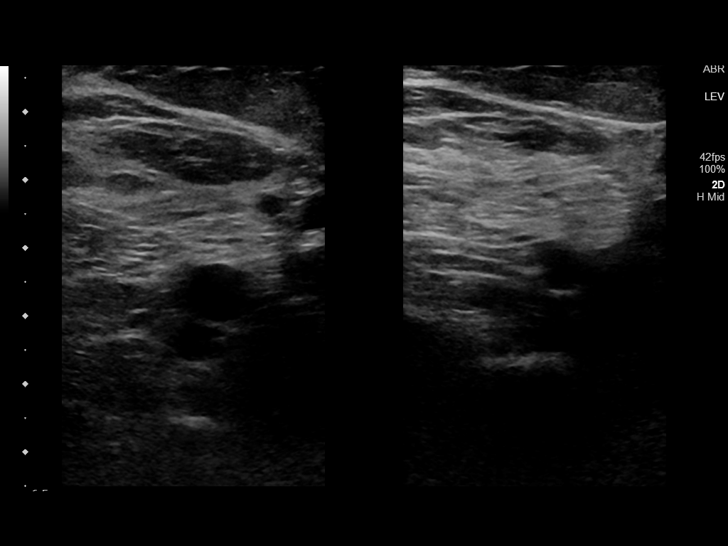
[im 29/61]
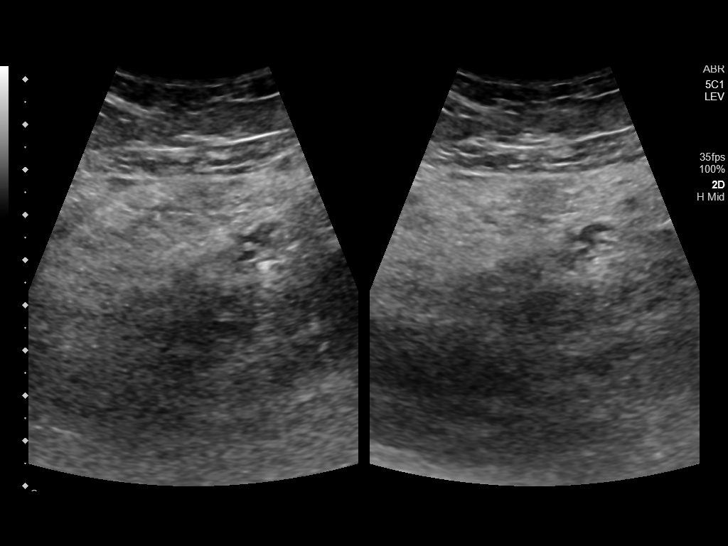
[im 34/61]
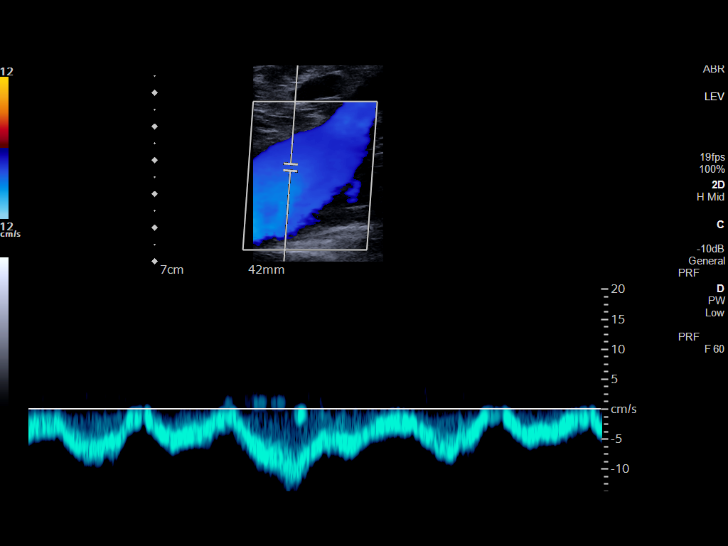
[im 40/61]
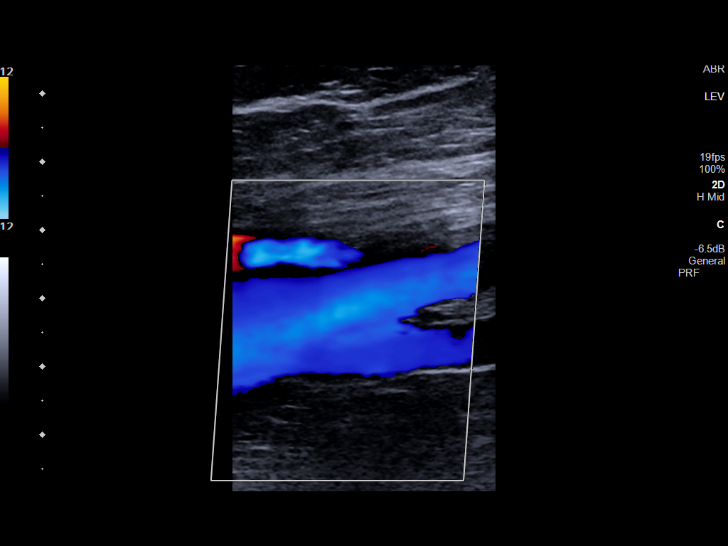
[im 45/61]
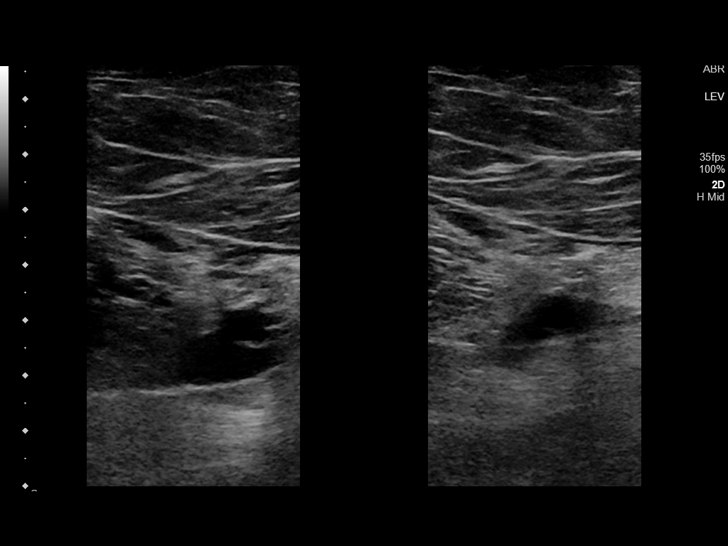
[im 50/61]
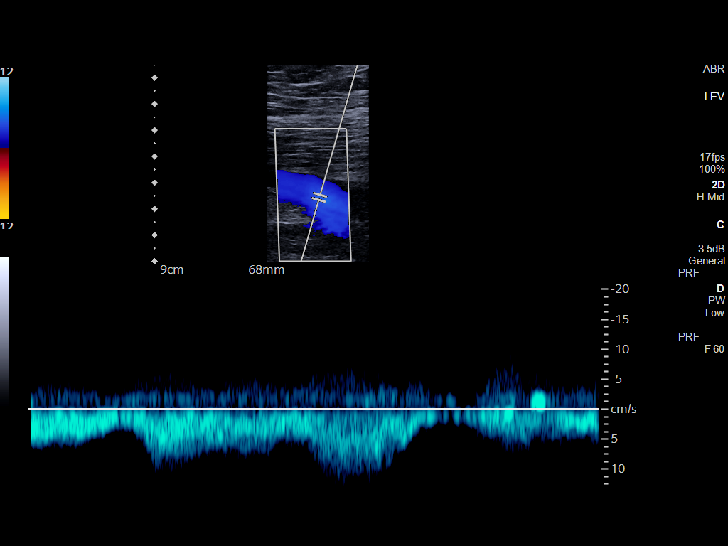
[im 55/61]
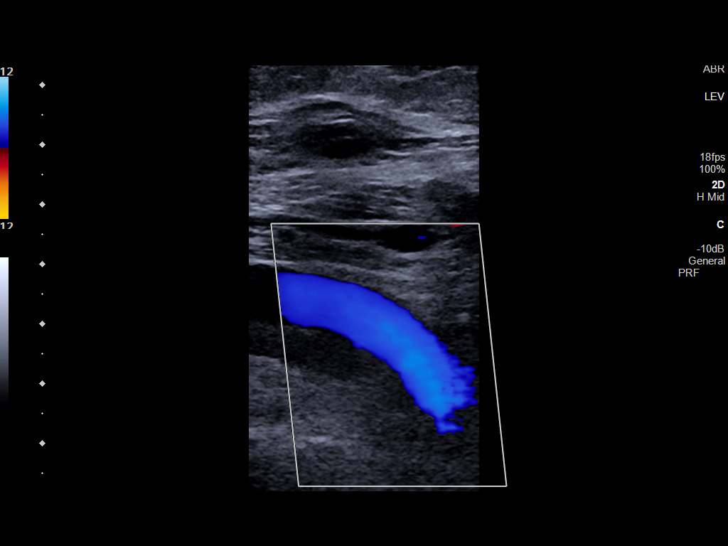
[im 61/61]
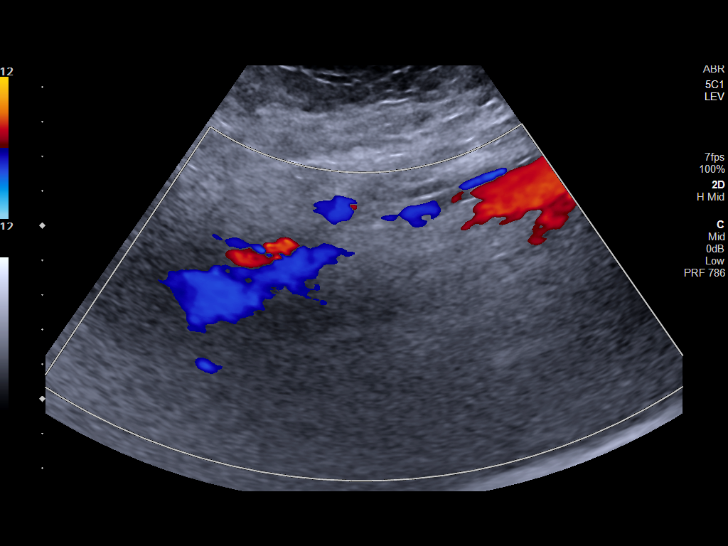

[12 of 24 positions shown; findings below may reference images not displayed]

FINDINGS: RIGHT LOWER EXTREMITY

Common Femoral Vein: No evidence of thrombus. Normal
compressibility, respiratory phasicity and response to augmentation.

Saphenofemoral Junction: No evidence of thrombus. Normal
compressibility and flow on color Doppler imaging.

Profunda Femoral Vein: No evidence of thrombus. Normal
compressibility and flow on color Doppler imaging.

Femoral Vein: No evidence of thrombus. Normal compressibility,
respiratory phasicity and response to augmentation.

Popliteal Vein: No evidence of thrombus. Normal compressibility,
respiratory phasicity and response to augmentation.

Calf Veins: No evidence of thrombus. Normal compressibility and flow
on color Doppler imaging.

Superficial Great Saphenous Vein: No evidence of thrombus. Normal
compressibility.

Venous Reflux:  None.

Other Findings: No evidence of superficial thrombophlebitis or
abnormal fluid collection.

LEFT LOWER EXTREMITY

Common Femoral Vein: No evidence of thrombus. Normal
compressibility, respiratory phasicity and response to augmentation.

Saphenofemoral Junction: No evidence of thrombus. Normal
compressibility and flow on color Doppler imaging.

Profunda Femoral Vein: No evidence of thrombus. Normal
compressibility and flow on color Doppler imaging.

Femoral Vein: No evidence of thrombus. Normal compressibility,
respiratory phasicity and response to augmentation.

Popliteal Vein: No evidence of thrombus. Normal compressibility,
respiratory phasicity and response to augmentation.

Calf Veins: The left posterior tibial vein did not appear to
completely compress. There is flow present by color Doppler.
Component of nonocclusive thrombus or chronic mural thrombus cannot
be excluded.

Superficial Great Saphenous Vein: No evidence of thrombus. Normal
compressibility.

Venous Reflux:  None.

Other Findings: No evidence of superficial thrombophlebitis or
abnormal fluid collection.
IMPRESSION: 1. No evidence of right lower extremity deep vein thrombosis.
2. Lack of full compressibility of the left posterior tibial vein by
ultrasound. Component of nonocclusive thrombus or chronic
nonocclusive mural thrombus cannot be excluded.

## 2021-03-07 DIAGNOSIS — M25562 Pain in left knee: Secondary | ICD-10-CM | POA: Diagnosis not present

## 2021-03-07 DIAGNOSIS — G8929 Other chronic pain: Secondary | ICD-10-CM | POA: Diagnosis not present

## 2021-03-07 DIAGNOSIS — I890 Lymphedema, not elsewhere classified: Secondary | ICD-10-CM | POA: Diagnosis not present

## 2021-03-07 DIAGNOSIS — E119 Type 2 diabetes mellitus without complications: Secondary | ICD-10-CM | POA: Diagnosis not present

## 2021-03-07 DIAGNOSIS — L2089 Other atopic dermatitis: Secondary | ICD-10-CM | POA: Diagnosis not present

## 2021-03-07 DIAGNOSIS — M25561 Pain in right knee: Secondary | ICD-10-CM | POA: Diagnosis not present

## 2021-03-07 DIAGNOSIS — Z7689 Persons encountering health services in other specified circumstances: Secondary | ICD-10-CM | POA: Diagnosis not present

## 2021-03-07 DIAGNOSIS — Z780 Asymptomatic menopausal state: Secondary | ICD-10-CM | POA: Diagnosis not present

## 2021-04-16 ENCOUNTER — Encounter (INDEPENDENT_AMBULATORY_CARE_PROVIDER_SITE_OTHER): Payer: Self-pay | Admitting: Vascular Surgery

## 2021-04-16 DIAGNOSIS — M170 Bilateral primary osteoarthritis of knee: Secondary | ICD-10-CM | POA: Diagnosis not present

## 2021-04-17 ENCOUNTER — Other Ambulatory Visit: Payer: Self-pay | Admitting: Orthopedic Surgery

## 2021-04-17 ENCOUNTER — Other Ambulatory Visit (HOSPITAL_COMMUNITY): Payer: Self-pay | Admitting: Orthopedic Surgery

## 2021-04-17 DIAGNOSIS — M170 Bilateral primary osteoarthritis of knee: Secondary | ICD-10-CM

## 2021-04-25 ENCOUNTER — Other Ambulatory Visit: Payer: Self-pay

## 2021-04-25 ENCOUNTER — Ambulatory Visit
Admission: RE | Admit: 2021-04-25 | Discharge: 2021-04-25 | Disposition: A | Discharge status: Home / Self Care | Payer: PPO | Source: Ambulatory Visit | Attending: Orthopedic Surgery | Admitting: Orthopedic Surgery

## 2021-04-25 DIAGNOSIS — M7122 Synovial cyst of popliteal space [Baker], left knee: Secondary | ICD-10-CM | POA: Diagnosis not present

## 2021-04-25 DIAGNOSIS — M170 Bilateral primary osteoarthritis of knee: Secondary | ICD-10-CM | POA: Diagnosis not present

## 2021-04-25 DIAGNOSIS — Z01818 Encounter for other preprocedural examination: Secondary | ICD-10-CM | POA: Diagnosis not present

## 2021-04-25 DIAGNOSIS — R609 Edema, unspecified: Secondary | ICD-10-CM | POA: Diagnosis not present

## 2021-04-25 DIAGNOSIS — M1712 Unilateral primary osteoarthritis, left knee: Secondary | ICD-10-CM | POA: Diagnosis not present

## 2021-05-10 ENCOUNTER — Other Ambulatory Visit: Payer: Self-pay | Admitting: Orthopedic Surgery

## 2021-05-11 ENCOUNTER — Other Ambulatory Visit: Payer: Self-pay

## 2021-05-11 ENCOUNTER — Ambulatory Visit: Payer: PPO | Admitting: Physician Assistant

## 2021-05-11 ENCOUNTER — Encounter (INDEPENDENT_AMBULATORY_CARE_PROVIDER_SITE_OTHER): Payer: Self-pay | Admitting: Vascular Surgery

## 2021-05-11 ENCOUNTER — Ambulatory Visit (INDEPENDENT_AMBULATORY_CARE_PROVIDER_SITE_OTHER): Payer: PPO | Admitting: Vascular Surgery

## 2021-05-11 VITALS — BP 142/75 | HR 118 | Ht 64.0 in | Wt 272.0 lb

## 2021-05-11 DIAGNOSIS — I10 Essential (primary) hypertension: Secondary | ICD-10-CM | POA: Diagnosis not present

## 2021-05-11 DIAGNOSIS — L97911 Non-pressure chronic ulcer of unspecified part of right lower leg limited to breakdown of skin: Secondary | ICD-10-CM

## 2021-05-11 DIAGNOSIS — I890 Lymphedema, not elsewhere classified: Secondary | ICD-10-CM | POA: Diagnosis not present
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
## 2021-05-11 NOTE — Assessment & Plan Note (Signed)
3 layer Unna boot was placed today and will be changed weekly.

## 2021-05-11 NOTE — Assessment & Plan Note (Signed)
The patient has stage III lymphedema with severe swelling and skin changes to the legs worse on the right than the left.  She has now developed swelling on the left leg as well.  She has an ulceration on the right leg and willing to wrap her in an Unna boot in the right lower leg and put a dressing on her thigh wound today.  The Unna boot will be changed weekly.  We will get a venous reflux study in the near future at her convenience to see if there is significant venous disease contributing to her swelling.  Elevation, compression, and the continued use of the lymphedema pump will be very important.  We discussed increasing the pressure on her lymphedema pump and she may benefit from one of the newer devices as her pump is about a decade old.  We will see her back in several weeks

## 2021-05-11 NOTE — Assessment & Plan Note (Signed)
blood pressure control important in reducing the progression of atherosclerotic disease. On appropriate oral medications.  

## 2021-05-11 NOTE — Assessment & Plan Note (Signed)
Worsens LE swelling 

## 2021-05-16 ENCOUNTER — Ambulatory Visit: Payer: PPO | Admitting: Internal Medicine

## 2021-05-18 ENCOUNTER — Ambulatory Visit (INDEPENDENT_AMBULATORY_CARE_PROVIDER_SITE_OTHER): Payer: PPO | Admitting: Nurse Practitioner

## 2021-05-18 ENCOUNTER — Encounter (INDEPENDENT_AMBULATORY_CARE_PROVIDER_SITE_OTHER): Payer: Self-pay | Admitting: Nurse Practitioner

## 2021-05-18 ENCOUNTER — Other Ambulatory Visit: Payer: Self-pay

## 2021-05-18 VITALS — BP 174/83 | HR 83 | Resp 16 | Ht 62.0 in | Wt 263.0 lb

## 2021-05-18 DIAGNOSIS — L97911 Non-pressure chronic ulcer of unspecified part of right lower leg limited to breakdown of skin: Secondary | ICD-10-CM | POA: Diagnosis not present

## 2021-05-18 NOTE — Progress Notes (Signed)
History of Present Illness  There is no documented history at this time  Assessments & Plan   There are no diagnoses linked to this encounter.    Additional instructions  Subjective:  Patient presents with venous ulcer of the Bilateral lower extremity.    Procedure:  3 layer unna wrap was placed Bilateral lower extremity.   Plan:   Follow up in one week.  

## 2021-05-20 ENCOUNTER — Other Ambulatory Visit: Payer: Self-pay | Admitting: Physician Assistant

## 2021-05-20 DIAGNOSIS — I10 Essential (primary) hypertension: Secondary | ICD-10-CM

## 2021-05-24 ENCOUNTER — Other Ambulatory Visit: Payer: PPO

## 2021-05-25 ENCOUNTER — Encounter
Admission: RE | Admit: 2021-05-25 | Discharge: 2021-05-25 | Disposition: A | Discharge status: Home / Self Care | Payer: PPO | Source: Ambulatory Visit | Attending: Orthopedic Surgery | Admitting: Orthopedic Surgery

## 2021-05-25 ENCOUNTER — Other Ambulatory Visit: Payer: Self-pay

## 2021-05-25 ENCOUNTER — Ambulatory Visit (INDEPENDENT_AMBULATORY_CARE_PROVIDER_SITE_OTHER): Payer: PPO | Admitting: Nurse Practitioner

## 2021-05-25 ENCOUNTER — Encounter (INDEPENDENT_AMBULATORY_CARE_PROVIDER_SITE_OTHER): Payer: Self-pay

## 2021-05-25 VITALS — BP 100/73 | HR 94 | Resp 16

## 2021-05-25 DIAGNOSIS — Z01818 Encounter for other preprocedural examination: Secondary | ICD-10-CM | POA: Diagnosis not present

## 2021-05-25 DIAGNOSIS — Z01810 Encounter for preprocedural cardiovascular examination: Secondary | ICD-10-CM | POA: Diagnosis not present

## 2021-05-25 DIAGNOSIS — L97911 Non-pressure chronic ulcer of unspecified part of right lower leg limited to breakdown of skin: Secondary | ICD-10-CM

## 2021-05-25 HISTORY — DX: Lymphedema, not elsewhere classified: I89.0

## 2021-05-25 HISTORY — DX: Varicose veins of right lower extremity with ulcer of unspecified site: I83.019

## 2021-05-25 HISTORY — DX: Unspecified osteoarthritis, unspecified site: M19.90

## 2021-05-25 HISTORY — DX: Anemia, unspecified: D64.9

## 2021-05-25 HISTORY — DX: Gastro-esophageal reflux disease without esophagitis: K21.9

## 2021-05-25 LAB — CBC WITH DIFFERENTIAL/PLATELET
Abs Immature Granulocytes: 0.02 10*3/uL (ref 0.00–0.07)
Basophils Absolute: 0 10*3/uL (ref 0.0–0.1)
Basophils Relative: 1 %
Eosinophils Absolute: 0.2 10*3/uL (ref 0.0–0.5)
Eosinophils Relative: 3 %
HCT: 32.7 % — ABNORMAL LOW (ref 36.0–46.0)
Hemoglobin: 11 g/dL — ABNORMAL LOW (ref 12.0–15.0)
Immature Granulocytes: 0 %
Lymphocytes Relative: 26 %
Lymphs Abs: 1.7 10*3/uL (ref 0.7–4.0)
MCH: 31.7 pg (ref 26.0–34.0)
MCHC: 33.6 g/dL (ref 30.0–36.0)
MCV: 94.2 fL (ref 80.0–100.0)
Monocytes Absolute: 0.3 10*3/uL (ref 0.1–1.0)
Monocytes Relative: 4 %
Neutro Abs: 4.4 10*3/uL (ref 1.7–7.7)
Neutrophils Relative %: 66 %
Platelets: 282 10*3/uL (ref 150–400)
RBC: 3.47 MIL/uL — ABNORMAL LOW (ref 3.87–5.11)
RDW: 15.1 % (ref 11.5–15.5)
WBC: 6.5 10*3/uL (ref 4.0–10.5)
nRBC: 0 % (ref 0.0–0.2)

## 2021-05-25 LAB — COMPREHENSIVE METABOLIC PANEL
ALT: 15 U/L (ref 0–44)
AST: 15 U/L (ref 15–41)
Albumin: 3.9 g/dL (ref 3.5–5.0)
Alkaline Phosphatase: 76 U/L (ref 38–126)
Anion gap: 9 (ref 5–15)
BUN: 25 mg/dL — ABNORMAL HIGH (ref 8–23)
CO2: 27 mmol/L (ref 22–32)
Calcium: 9.3 mg/dL (ref 8.9–10.3)
Chloride: 103 mmol/L (ref 98–111)
Creatinine, Ser: 1.25 mg/dL — ABNORMAL HIGH (ref 0.44–1.00)
GFR, Estimated: 48 mL/min — ABNORMAL LOW (ref >60)
Glucose, Bld: 95 mg/dL (ref 70–99)
Potassium: 3.8 mmol/L (ref 3.5–5.1)
Sodium: 139 mmol/L (ref 135–145)
Total Bilirubin: 0.6 mg/dL (ref 0.3–1.2)
Total Protein: 8.5 g/dL — ABNORMAL HIGH (ref 6.5–8.1)

## 2021-05-25 LAB — URINALYSIS, ROUTINE W REFLEX MICROSCOPIC
Bilirubin Urine: NEGATIVE
Glucose, UA: NEGATIVE mg/dL
Hgb urine dipstick: NEGATIVE
Ketones, ur: NEGATIVE mg/dL
Leukocytes,Ua: NEGATIVE
Nitrite: NEGATIVE
Protein, ur: NEGATIVE mg/dL
Specific Gravity, Urine: 1.016 (ref 1.005–1.030)
pH: 5 (ref 5.0–8.0)

## 2021-05-25 LAB — HEMOGLOBIN A1C
Hgb A1c MFr Bld: 6.5 % — ABNORMAL HIGH (ref 4.8–5.6)
Mean Plasma Glucose: 139.85 mg/dL

## 2021-05-25 LAB — SURGICAL PCR SCREEN
MRSA, PCR: NEGATIVE
Staphylococcus aureus: NEGATIVE

## 2021-05-25 LAB — VITAMIN D 25 HYDROXY (VIT D DEFICIENCY, FRACTURES): Vit D, 25-Hydroxy: 8.98 ng/mL — ABNORMAL LOW (ref 30–100)

## 2021-05-25 NOTE — Progress Notes (Signed)
History of Present Illness  There is no documented history at this time  Assessments & Plan   There are no diagnoses linked to this encounter.    Additional instructions  Subjective:  Patient presents with venous ulcer of the Bilateral lower extremity.    Procedure:  3 layer unna wrap was placed Bilateral lower extremity.   Plan:   Follow up in one week.  

## 2021-05-25 NOTE — Patient Instructions (Addendum)
Your procedure is scheduled on:06-05-21 Tuesday Report to the Registration Desk on the 1st floor of the [facility]-Then proceed to the 2nd floor Surgery Desk in the [place] To find out your arrival time, please call [phone] between 1PM - 3PM on:06-04-21 Monday  REMEMBER: Instructions that are not followed completely may result in serious medical risk, up to and including death; or upon the discretion of your surgeon and anesthesiologist your surgery may need to be rescheduled.  Do not eat food after midnight the night before surgery.  No gum chewing, lozengers or hard candies.  You may however, drink CLEAR liquids up to 2 hours before you are scheduled to arrive for your surgery. Do not drink anything within 2 hours of your scheduled arrival time.  Clear liquids include: - water  - apple juice without pulp - gatorade  - black coffee or tea (Do NOT add milk or creamers to the coffee or tea) Do NOT drink anything that is not on this list.  In addition, your doctor has ordered for you to drink the provided  Ensure Pre-Surgery Clear Carbohydrate Drink  Drinking this carbohydrate drink up to two hours before surgery helps to reduce insulin resistance and improve patient outcomes. Please complete drinking 2 hours prior to scheduled arrival time.  TAKE THESE MEDICATIONS THE MORNING OF SURGERY WITH A SIP OF WATER: -Metoprolol (Lopressor) -Gabapentin (Neurontin)  Stop your 81 mg Aspirin 7 days prior to surgery-Last dose on 05-28-21 Monday  One week prior to surgery: Stop Anti-inflammatories (NSAIDS) such as Advil, Aleve, Ibuprofen, Motrin, Naproxen, Naprosyn and Aspirin based products such as Excedrin, Goodys Powder, BC Powder.You may however, continue to take Tramadol/Tylenol if needed for pain up until the day of surgery.  Stop ANY OVER THE COUNTER supplements/vitamins 7 days prior to surgery (Folic Acid)  No Alcohol for 24 hours before or after surgery.  No Smoking including  e-cigarettes for 24 hours prior to surgery.  No chewable tobacco products for at least 6 hours prior to surgery.  No nicotine patches on the day of surgery.  Do not use any "recreational" drugs for at least a week prior to your surgery.  Please be advised that the combination of cocaine and anesthesia may have negative outcomes, up to and including death. If you test positive for cocaine, your surgery will be cancelled.  On the morning of surgery brush your teeth with toothpaste and water, you may rinse your mouth with mouthwash if you wish. Do not swallow any toothpaste or mouthwash.  Do not wear jewelry, make-up, hairpins, clips or nail polish.  Do not wear lotions, powders, or perfumes.   Do not shave body from the neck down 48 hours prior to surgery just in case you cut yourself which could leave a site for infection.  Also, freshly shaved skin may become irritated if using the CHG soap.  Contact lenses, hearing aids and dentures may not be worn into surgery.  Do not bring valuables to the hospital. [facility] is not responsible for any missing/lost belongings or valuables.   Use CHG Soap as directed on instruction sheet.  Notify your doctor if there is any change in your medical condition (cold, fever, infection).  Wear comfortable clothing (specific to your surgery type) to the hospital.  After surgery, you can help prevent lung complications by doing breathing exercises.  Take deep breaths and cough every 1-2 hours. Your doctor may order a device called an Incentive Spirometer to help you take deep  breaths. When coughing or sneezing, hold a pillow firmly against your incision with both hands. This is called "splinting." Doing this helps protect your incision. It also decreases belly discomfort.  If you are being admitted to the hospital overnight, leave your suitcase in the car. After surgery it may be brought to your room.  If you are being discharged the day of surgery,  you will not be allowed to drive home. You will need a responsible adult (18 years or older) to drive you home and stay with you that night.   If you are taking public transportation, you will need to have a responsible adult (18 years or older) with you. Please confirm with your physician that it is acceptable to use public transportation.   Please call the [facility]. at [phone] if you have any questions about these instructions.  Surgery Visitation Policy:  Patients undergoing a surgery or procedure may have one family member or support person with them as long as that person is not COVID-19 positive or experiencing its symptoms.  That person may remain in the waiting area during the procedure.  Inpatient Visitation:    Visiting hours are 7 a.m. to 8 p.m. Inpatients will be allowed two visitors daily. The visitors may change each day during the patient's stay. No visitors under the age of 102. Any visitor under the age of 37 must be accompanied by an adult. The visitor must pass COVID-19 screenings, use hand sanitizer when entering and exiting the patient's room and wear a mask at all times, including in the patient's room. Patients must also wear a mask when staff or their visitor are in the room. Masking is required regardless of vaccination status.

## 2021-05-28 ENCOUNTER — Encounter (INDEPENDENT_AMBULATORY_CARE_PROVIDER_SITE_OTHER): Payer: Self-pay | Admitting: Nurse Practitioner

## 2021-06-01 ENCOUNTER — Ambulatory Visit (INDEPENDENT_AMBULATORY_CARE_PROVIDER_SITE_OTHER): Payer: PPO | Admitting: Nurse Practitioner

## 2021-06-01 ENCOUNTER — Other Ambulatory Visit
Admission: RE | Admit: 2021-06-01 | Discharge: 2021-06-01 | Disposition: A | Discharge status: Home / Self Care | Payer: PPO | Source: Ambulatory Visit | Attending: Orthopedic Surgery | Admitting: Orthopedic Surgery

## 2021-06-01 ENCOUNTER — Other Ambulatory Visit: Payer: Self-pay

## 2021-06-01 ENCOUNTER — Encounter (INDEPENDENT_AMBULATORY_CARE_PROVIDER_SITE_OTHER): Payer: PPO

## 2021-06-01 DIAGNOSIS — Z20822 Contact with and (suspected) exposure to covid-19: Secondary | ICD-10-CM | POA: Insufficient documentation

## 2021-06-01 DIAGNOSIS — Z01812 Encounter for preprocedural laboratory examination: Secondary | ICD-10-CM | POA: Diagnosis not present

## 2021-06-01 LAB — SARS CORONAVIRUS 2 (TAT 6-24 HRS): SARS Coronavirus 2: NEGATIVE

## 2021-06-05 ENCOUNTER — Other Ambulatory Visit: Payer: Self-pay

## 2021-06-05 ENCOUNTER — Inpatient Hospital Stay
Admission: RE | Admit: 2021-06-05 | Discharge: 2021-06-08 | DRG: 470 | Disposition: A | Discharge status: Skilled Nursing Facility | Payer: PPO | Attending: Orthopedic Surgery | Admitting: Orthopedic Surgery

## 2021-06-05 ENCOUNTER — Encounter: Payer: Self-pay | Admitting: Orthopedic Surgery

## 2021-06-05 ENCOUNTER — Inpatient Hospital Stay: Payer: PPO | Admitting: Urgent Care

## 2021-06-05 ENCOUNTER — Inpatient Hospital Stay: Payer: PPO

## 2021-06-05 ENCOUNTER — Encounter
Admission: RE | Disposition: A | Discharge status: Skilled Nursing Facility | Payer: Self-pay | Source: Home / Self Care | Attending: Orthopedic Surgery

## 2021-06-05 DIAGNOSIS — Z91040 Latex allergy status: Secondary | ICD-10-CM

## 2021-06-05 DIAGNOSIS — Z471 Aftercare following joint replacement surgery: Secondary | ICD-10-CM | POA: Diagnosis not present

## 2021-06-05 DIAGNOSIS — Z79899 Other long term (current) drug therapy: Secondary | ICD-10-CM | POA: Diagnosis not present

## 2021-06-05 DIAGNOSIS — N179 Acute kidney failure, unspecified: Secondary | ICD-10-CM | POA: Diagnosis present

## 2021-06-05 DIAGNOSIS — Z7982 Long term (current) use of aspirin: Secondary | ICD-10-CM | POA: Diagnosis not present

## 2021-06-05 DIAGNOSIS — L97119 Non-pressure chronic ulcer of right thigh with unspecified severity: Secondary | ICD-10-CM | POA: Diagnosis not present

## 2021-06-05 DIAGNOSIS — E1142 Type 2 diabetes mellitus with diabetic polyneuropathy: Secondary | ICD-10-CM | POA: Diagnosis not present

## 2021-06-05 DIAGNOSIS — G629 Polyneuropathy, unspecified: Secondary | ICD-10-CM | POA: Diagnosis not present

## 2021-06-05 DIAGNOSIS — I959 Hypotension, unspecified: Secondary | ICD-10-CM | POA: Diagnosis not present

## 2021-06-05 DIAGNOSIS — Z9012 Acquired absence of left breast and nipple: Secondary | ICD-10-CM | POA: Diagnosis not present

## 2021-06-05 DIAGNOSIS — T797XXA Traumatic subcutaneous emphysema, initial encounter: Secondary | ICD-10-CM | POA: Diagnosis not present

## 2021-06-05 DIAGNOSIS — Z8249 Family history of ischemic heart disease and other diseases of the circulatory system: Secondary | ICD-10-CM

## 2021-06-05 DIAGNOSIS — D62 Acute posthemorrhagic anemia: Secondary | ICD-10-CM | POA: Diagnosis not present

## 2021-06-05 DIAGNOSIS — Z972 Presence of dental prosthetic device (complete) (partial): Secondary | ICD-10-CM

## 2021-06-05 DIAGNOSIS — M170 Bilateral primary osteoarthritis of knee: Secondary | ICD-10-CM | POA: Diagnosis not present

## 2021-06-05 DIAGNOSIS — Z96652 Presence of left artificial knee joint: Secondary | ICD-10-CM | POA: Diagnosis not present

## 2021-06-05 DIAGNOSIS — R278 Other lack of coordination: Secondary | ICD-10-CM | POA: Diagnosis not present

## 2021-06-05 DIAGNOSIS — I890 Lymphedema, not elsewhere classified: Secondary | ICD-10-CM | POA: Diagnosis not present

## 2021-06-05 DIAGNOSIS — M1712 Unilateral primary osteoarthritis, left knee: Secondary | ICD-10-CM | POA: Diagnosis not present

## 2021-06-05 DIAGNOSIS — Z20822 Contact with and (suspected) exposure to covid-19: Secondary | ICD-10-CM | POA: Diagnosis present

## 2021-06-05 DIAGNOSIS — M1991 Primary osteoarthritis, unspecified site: Secondary | ICD-10-CM | POA: Diagnosis not present

## 2021-06-05 DIAGNOSIS — Z91048 Other nonmedicinal substance allergy status: Secondary | ICD-10-CM | POA: Diagnosis not present

## 2021-06-05 DIAGNOSIS — Z743 Need for continuous supervision: Secondary | ICD-10-CM | POA: Diagnosis not present

## 2021-06-05 DIAGNOSIS — I10 Essential (primary) hypertension: Secondary | ICD-10-CM | POA: Diagnosis present

## 2021-06-05 DIAGNOSIS — E039 Hypothyroidism, unspecified: Secondary | ICD-10-CM | POA: Diagnosis not present

## 2021-06-05 DIAGNOSIS — Z6841 Body Mass Index (BMI) 40.0 and over, adult: Secondary | ICD-10-CM

## 2021-06-05 DIAGNOSIS — K219 Gastro-esophageal reflux disease without esophagitis: Secondary | ICD-10-CM | POA: Diagnosis present

## 2021-06-05 DIAGNOSIS — M069 Rheumatoid arthritis, unspecified: Secondary | ICD-10-CM | POA: Diagnosis not present

## 2021-06-05 DIAGNOSIS — M6281 Muscle weakness (generalized): Secondary | ICD-10-CM | POA: Diagnosis not present

## 2021-06-05 DIAGNOSIS — R2689 Other abnormalities of gait and mobility: Secondary | ICD-10-CM | POA: Diagnosis not present

## 2021-06-05 DIAGNOSIS — G8918 Other acute postprocedural pain: Secondary | ICD-10-CM

## 2021-06-05 HISTORY — PX: TOTAL KNEE ARTHROPLASTY: SHX125

## 2021-06-05 LAB — CBC
HCT: 30.6 % — ABNORMAL LOW (ref 36.0–46.0)
Hemoglobin: 10 g/dL — ABNORMAL LOW (ref 12.0–15.0)
MCH: 32.1 pg (ref 26.0–34.0)
MCHC: 32.7 g/dL (ref 30.0–36.0)
MCV: 98.1 fL (ref 80.0–100.0)
Platelets: 219 10*3/uL (ref 150–400)
RBC: 3.12 MIL/uL — ABNORMAL LOW (ref 3.87–5.11)
RDW: 15.2 % (ref 11.5–15.5)
WBC: 11.5 10*3/uL — ABNORMAL HIGH (ref 4.0–10.5)
nRBC: 0 % (ref 0.0–0.2)

## 2021-06-05 LAB — CREATININE, SERUM
Creatinine, Ser: 1.95 mg/dL — ABNORMAL HIGH (ref 0.44–1.00)
GFR, Estimated: 28 mL/min — ABNORMAL LOW (ref >60)

## 2021-06-05 LAB — ABO/RH: ABO/RH(D): AB NEG

## 2021-06-05 SURGERY — ARTHROPLASTY, KNEE, TOTAL
Anesthesia: Spinal | Site: Knee | Laterality: Left

## 2021-06-05 MED ORDER — FOLIC ACID 1 MG PO TABS
1.0000 mg | ORAL_TABLET | ORAL | Status: DC
  Administered 2021-06-06 – 2021-06-08 (×3): 1 mg via ORAL
  Filled 2021-06-05 (×2): qty 1

## 2021-06-05 MED ORDER — PROPOFOL 500 MG/50ML IV EMUL
INTRAVENOUS | Status: DC | PRN
  Administered 2021-06-05: 100 ug/kg/min via INTRAVENOUS

## 2021-06-05 MED ORDER — CEFAZOLIN SODIUM-DEXTROSE 2-4 GM/100ML-% IV SOLN
2.0000 g | Freq: Four times a day (QID) | INTRAVENOUS | Status: AC
Start: 2021-06-05 — End: 2021-06-05
  Administered 2021-06-05: 2 g via INTRAVENOUS
  Filled 2021-06-05 (×3): qty 100

## 2021-06-05 MED ORDER — CHLORHEXIDINE GLUCONATE 0.12 % MT SOLN: 15.0000 mL | Freq: Once | OROMUCOSAL | Status: AC

## 2021-06-05 MED ORDER — POTASSIUM CHLORIDE CRYS ER 10 MEQ PO TBCR
10.0000 meq | EXTENDED_RELEASE_TABLET | Freq: Every day | ORAL | Status: DC
  Administered 2021-06-06 – 2021-06-08 (×3): 10 meq via ORAL
  Filled 2021-06-05 (×5): qty 1

## 2021-06-05 MED ORDER — BUPIVACAINE LIPOSOME 1.3 % IJ SUSP
INTRAMUSCULAR | Status: AC
  Filled 2021-06-05: qty 20

## 2021-06-05 MED ORDER — ASPIRIN EC 81 MG PO TBEC
81.0000 mg | DELAYED_RELEASE_TABLET | Freq: Every day | ORAL | Status: DC
  Administered 2021-06-06 – 2021-06-08 (×3): 81 mg via ORAL
  Filled 2021-06-05 (×4): qty 1

## 2021-06-05 MED ORDER — PHENOL 1.4 % MT LIQD
1.0000 | OROMUCOSAL | Status: DC | PRN
  Filled 2021-06-05: qty 177

## 2021-06-05 MED ORDER — FUROSEMIDE 20 MG PO TABS
20.0000 mg | ORAL_TABLET | Freq: Every day | ORAL | Status: DC
  Administered 2021-06-06 – 2021-06-08 (×3): 20 mg via ORAL
  Filled 2021-06-05 (×4): qty 1

## 2021-06-05 MED ORDER — METHOCARBAMOL 500 MG PO TABS: 500.0000 mg | ORAL_TABLET | Freq: Four times a day (QID) | ORAL | Status: DC | PRN

## 2021-06-05 MED ORDER — PHENYLEPHRINE HCL (PRESSORS) 10 MG/ML IV SOLN
INTRAVENOUS | Status: DC | PRN
  Administered 2021-06-05: 100 ug via INTRAVENOUS

## 2021-06-05 MED ORDER — GABAPENTIN 300 MG PO CAPS
300.0000 mg | ORAL_CAPSULE | Freq: Three times a day (TID) | ORAL | Status: DC
  Administered 2021-06-05 – 2021-06-08 (×10): 300 mg via ORAL
  Filled 2021-06-05 (×10): qty 1

## 2021-06-05 MED ORDER — FENTANYL CITRATE (PF) 100 MCG/2ML IJ SOLN
INTRAMUSCULAR | Status: AC
  Filled 2021-06-05: qty 2

## 2021-06-05 MED ORDER — FENTANYL CITRATE (PF) 100 MCG/2ML IJ SOLN: 25.0000 ug | INTRAMUSCULAR | Status: DC | PRN

## 2021-06-05 MED ORDER — 0.9 % SODIUM CHLORIDE (POUR BTL) OPTIME
TOPICAL | Status: DC | PRN
  Administered 2021-06-05: 500 mL

## 2021-06-05 MED ORDER — DIPHENHYDRAMINE HCL 12.5 MG/5ML PO ELIX: 12.5000 mg | ORAL_SOLUTION | ORAL | Status: DC | PRN

## 2021-06-05 MED ORDER — ONDANSETRON HCL 4 MG PO TABS: 4.0000 mg | ORAL_TABLET | Freq: Four times a day (QID) | ORAL | Status: DC | PRN

## 2021-06-05 MED ORDER — FLEET ENEMA 7-19 GM/118ML RE ENEM: 1.0000 | ENEMA | Freq: Once | RECTAL | Status: DC | PRN

## 2021-06-05 MED ORDER — PROPOFOL 1000 MG/100ML IV EMUL
INTRAVENOUS | Status: AC
  Filled 2021-06-05: qty 100

## 2021-06-05 MED ORDER — LIDOCAINE HCL (PF) 2 % IJ SOLN
INTRAMUSCULAR | Status: DC | PRN
  Administered 2021-06-05: 60 mg via INTRADERMAL

## 2021-06-05 MED ORDER — CALCIUM CARBONATE ANTACID 500 MG PO CHEW: 1.0000 | CHEWABLE_TABLET | Freq: Every day | ORAL | Status: DC | PRN

## 2021-06-05 MED ORDER — OXYCODONE HCL 5 MG PO TABS: 5.0000 mg | ORAL_TABLET | Freq: Once | ORAL | Status: DC | PRN

## 2021-06-05 MED ORDER — METHOTREXATE 2.5 MG PO TABS: 15.0000 mg | ORAL_TABLET | ORAL | Status: DC

## 2021-06-05 MED ORDER — CEFAZOLIN SODIUM-DEXTROSE 2-4 GM/100ML-% IV SOLN
2.0000 g | INTRAVENOUS | Status: AC
  Administered 2021-06-05: 3 g via INTRAVENOUS

## 2021-06-05 MED ORDER — POLYETHYLENE GLYCOL 3350 17 G PO PACK
17.0000 g | PACK | Freq: Every day | ORAL | Status: DC | PRN
  Administered 2021-06-07: 17 g via ORAL
  Filled 2021-06-05: qty 1

## 2021-06-05 MED ORDER — ORAL CARE MOUTH RINSE: 15.0000 mL | Freq: Once | OROMUCOSAL | Status: AC

## 2021-06-05 MED ORDER — SODIUM CHLORIDE FLUSH 0.9 % IV SOLN
INTRAVENOUS | Status: AC
  Filled 2021-06-05: qty 40

## 2021-06-05 MED ORDER — IRBESARTAN 150 MG PO TABS
75.0000 mg | ORAL_TABLET | Freq: Every day | ORAL | Status: DC
  Administered 2021-06-06 – 2021-06-08 (×3): 75 mg via ORAL
  Filled 2021-06-05 (×3): qty 1

## 2021-06-05 MED ORDER — FAMOTIDINE 20 MG PO TABS
ORAL_TABLET | ORAL | Status: AC
  Administered 2021-06-05: 20 mg via ORAL
  Filled 2021-06-05: qty 1

## 2021-06-05 MED ORDER — MIDAZOLAM HCL 5 MG/5ML IJ SOLN
INTRAMUSCULAR | Status: DC | PRN
  Administered 2021-06-05: .5 mg via INTRAVENOUS
  Administered 2021-06-05: 1 mg via INTRAVENOUS

## 2021-06-05 MED ORDER — MORPHINE SULFATE (PF) 2 MG/ML IV SOLN: 0.5000 mg | INTRAVENOUS | Status: DC | PRN

## 2021-06-05 MED ORDER — HYDROCODONE-ACETAMINOPHEN 5-325 MG PO TABS
1.0000 | ORAL_TABLET | ORAL | Status: DC | PRN
  Administered 2021-06-05 – 2021-06-08 (×4): 2 via ORAL
  Filled 2021-06-05 (×4): qty 2

## 2021-06-05 MED ORDER — METHOCARBAMOL 1000 MG/10ML IJ SOLN
500.0000 mg | Freq: Four times a day (QID) | INTRAVENOUS | Status: DC | PRN
  Filled 2021-06-05: qty 5

## 2021-06-05 MED ORDER — ZOLPIDEM TARTRATE 5 MG PO TABS: 5.0000 mg | ORAL_TABLET | Freq: Every evening | ORAL | Status: DC | PRN

## 2021-06-05 MED ORDER — DOCUSATE SODIUM 100 MG PO CAPS
100.0000 mg | ORAL_CAPSULE | Freq: Two times a day (BID) | ORAL | Status: DC
  Administered 2021-06-05 – 2021-06-08 (×7): 100 mg via ORAL
  Filled 2021-06-05 (×7): qty 1

## 2021-06-05 MED ORDER — ALUM & MAG HYDROXIDE-SIMETH 200-200-20 MG/5ML PO SUSP: 30.0000 mL | ORAL | Status: DC | PRN

## 2021-06-05 MED ORDER — CLOBETASOL PROPIONATE 0.05 % EX OINT
1.0000 "application " | TOPICAL_OINTMENT | Freq: Two times a day (BID) | CUTANEOUS | Status: DC | PRN
  Filled 2021-06-05: qty 15

## 2021-06-05 MED ORDER — OXYCODONE HCL 5 MG/5ML PO SOLN
5.0000 mg | Freq: Once | ORAL | Status: DC | PRN
Start: 2021-06-05 — End: 2021-06-05

## 2021-06-05 MED ORDER — MENTHOL 3 MG MT LOZG
1.0000 | LOZENGE | OROMUCOSAL | Status: DC | PRN
  Filled 2021-06-05: qty 9

## 2021-06-05 MED ORDER — TRAMADOL HCL 50 MG PO TABS
50.0000 mg | ORAL_TABLET | Freq: Four times a day (QID) | ORAL | Status: DC
  Administered 2021-06-05 – 2021-06-08 (×8): 50 mg via ORAL
  Filled 2021-06-05 (×9): qty 1

## 2021-06-05 MED ORDER — CEFAZOLIN SODIUM-DEXTROSE 2-4 GM/100ML-% IV SOLN
INTRAVENOUS | Status: AC
  Administered 2021-06-05: 2 g via INTRAVENOUS
  Filled 2021-06-05: qty 100

## 2021-06-05 MED ORDER — MORPHINE SULFATE (PF) 10 MG/ML IV SOLN
INTRAVENOUS | Status: DC | PRN
  Administered 2021-06-05: 10 mg

## 2021-06-05 MED ORDER — SODIUM CHLORIDE 0.9 % IV SOLN
INTRAVENOUS | Status: DC | PRN
  Administered 2021-06-05: 50 ug/min via INTRAVENOUS

## 2021-06-05 MED ORDER — FAMOTIDINE 20 MG PO TABS: 20.0000 mg | ORAL_TABLET | Freq: Once | ORAL | Status: AC

## 2021-06-05 MED ORDER — LACTATED RINGERS IV SOLN: INTRAVENOUS | Status: DC

## 2021-06-05 MED ORDER — TRIAMCINOLONE ACETONIDE 0.1 % EX OINT
1.0000 "application " | TOPICAL_OINTMENT | Freq: Every day | CUTANEOUS | Status: DC | PRN
  Filled 2021-06-05: qty 15

## 2021-06-05 MED ORDER — ACETAMINOPHEN 325 MG PO TABS: 325.0000 mg | ORAL_TABLET | Freq: Four times a day (QID) | ORAL | Status: DC | PRN

## 2021-06-05 MED ORDER — METOPROLOL TARTRATE 25 MG PO TABS
25.0000 mg | ORAL_TABLET | ORAL | Status: DC
  Administered 2021-06-06 – 2021-06-08 (×3): 25 mg via ORAL
  Filled 2021-06-05 (×3): qty 1

## 2021-06-05 MED ORDER — CHLORHEXIDINE GLUCONATE 0.12 % MT SOLN
OROMUCOSAL | Status: AC
  Administered 2021-06-05: 15 mL via OROMUCOSAL
  Filled 2021-06-05: qty 15

## 2021-06-05 MED ORDER — MORPHINE SULFATE (PF) 10 MG/ML IV SOLN
INTRAVENOUS | Status: AC
  Filled 2021-06-05: qty 1

## 2021-06-05 MED ORDER — FENTANYL CITRATE (PF) 100 MCG/2ML IJ SOLN
INTRAMUSCULAR | Status: DC | PRN
  Administered 2021-06-05: 25 ug via INTRAVENOUS

## 2021-06-05 MED ORDER — BUPIVACAINE-EPINEPHRINE (PF) 0.25% -1:200000 IJ SOLN
INTRAMUSCULAR | Status: DC | PRN
  Administered 2021-06-05: 30 mL via PERINEURAL

## 2021-06-05 MED ORDER — BUPIVACAINE-EPINEPHRINE (PF) 0.25% -1:200000 IJ SOLN
INTRAMUSCULAR | Status: AC
  Filled 2021-06-05: qty 30

## 2021-06-05 MED ORDER — BISACODYL 10 MG RE SUPP
10.0000 mg | Freq: Every day | RECTAL | Status: DC | PRN
  Administered 2021-06-07: 10 mg via RECTAL
  Filled 2021-06-05: qty 1

## 2021-06-05 MED ORDER — SODIUM CHLORIDE 0.9 % IV SOLN: INTRAVENOUS | Status: DC

## 2021-06-05 MED ORDER — ONDANSETRON HCL 4 MG/2ML IJ SOLN: 4.0000 mg | Freq: Four times a day (QID) | INTRAMUSCULAR | Status: DC | PRN

## 2021-06-05 MED ORDER — ENOXAPARIN SODIUM 30 MG/0.3ML IJ SOSY
30.0000 mg | PREFILLED_SYRINGE | Freq: Two times a day (BID) | INTRAMUSCULAR | Status: DC
  Administered 2021-06-06 – 2021-06-08 (×5): 30 mg via SUBCUTANEOUS
  Filled 2021-06-05 (×5): qty 0.3

## 2021-06-05 MED ORDER — ONDANSETRON HCL 4 MG/2ML IJ SOLN
INTRAMUSCULAR | Status: DC | PRN
  Administered 2021-06-05: 4 mg via INTRAVENOUS

## 2021-06-05 MED ORDER — BUPIVACAINE HCL (PF) 0.5 % IJ SOLN
INTRAMUSCULAR | Status: DC | PRN
  Administered 2021-06-05: 2.6 mL

## 2021-06-05 MED ORDER — SODIUM CHLORIDE 0.9 % IV SOLN
INTRAVENOUS | Status: DC | PRN
  Administered 2021-06-05: 60 mL

## 2021-06-05 MED ORDER — MIDAZOLAM HCL 2 MG/2ML IJ SOLN
INTRAMUSCULAR | Status: AC
  Filled 2021-06-05: qty 2

## 2021-06-05 MED ORDER — HYDROCHLOROTHIAZIDE 25 MG PO TABS
25.0000 mg | ORAL_TABLET | Freq: Every day | ORAL | Status: DC
  Administered 2021-06-06 – 2021-06-08 (×3): 25 mg via ORAL
  Filled 2021-06-05 (×4): qty 1

## 2021-06-05 MED ORDER — HYDROCODONE-ACETAMINOPHEN 7.5-325 MG PO TABS
1.0000 | ORAL_TABLET | ORAL | Status: DC | PRN
  Administered 2021-06-06: 2 via ORAL
  Filled 2021-06-05: qty 2

## 2021-06-05 SURGICAL SUPPLY — 68 items
BLADE SAGITTAL 25.0X1.19X90 (BLADE) ×2 IMPLANT
BLADE SAW 90X13X1.19 OSCILLAT (BLADE) ×2 IMPLANT
BNDG ELASTIC 6X5.8 VLCR STR LF (GAUZE/BANDAGES/DRESSINGS) ×2 IMPLANT
CANISTER SUCT 1200ML W/VALVE (MISCELLANEOUS) ×2 IMPLANT
CANISTER WOUND CARE 500ML ATS (WOUND CARE) ×2 IMPLANT
CEMENT HV SMART SET (Cement) ×4 IMPLANT
CHLORAPREP W/TINT 26 (MISCELLANEOUS) ×4 IMPLANT
COOLER POLAR GLACIER W/PUMP (MISCELLANEOUS) ×2 IMPLANT
CUFF TOURN SGL QUICK 24 (TOURNIQUET CUFF)
CUFF TOURN SGL QUICK 34 (TOURNIQUET CUFF)
CUFF TRNQT CYL 24X4X16.5-23 (TOURNIQUET CUFF) IMPLANT
CUFF TRNQT CYL 34X4.125X (TOURNIQUET CUFF) IMPLANT
DRAPE 3/4 80X56 (DRAPES) ×4 IMPLANT
DRSG MEPILEX SACRM 8.7X9.8 (GAUZE/BANDAGES/DRESSINGS) ×2 IMPLANT
ELECT CAUTERY BLADE 6.4 (BLADE) ×2 IMPLANT
ELECT REM PT RETURN 9FT ADLT (ELECTROSURGICAL) ×2
ELECTRODE REM PT RTRN 9FT ADLT (ELECTROSURGICAL) ×1 IMPLANT
FEMORAL COMPONENT SZ2 LEFT (Femur) ×2 IMPLANT
GAUZE 4X4 16PLY ~~LOC~~+RFID DBL (SPONGE) ×2 IMPLANT
GAUZE SPONGE 4X4 12PLY STRL (GAUZE/BANDAGES/DRESSINGS) ×2 IMPLANT
GAUZE XEROFORM 1X8 LF (GAUZE/BANDAGES/DRESSINGS) ×2 IMPLANT
GLOVE SURG ORTHO LTX SZ8 (GLOVE) ×2 IMPLANT
GLOVE SURG SYN 9.0  PF PI (GLOVE) ×1
GLOVE SURG SYN 9.0 PF PI (GLOVE) ×1 IMPLANT
GLOVE SURG UNDER LTX SZ8 (GLOVE) ×2 IMPLANT
GLOVE SURG UNDER POLY LF SZ9 (GLOVE) ×2 IMPLANT
GOWN SRG 2XL LVL 4 RGLN SLV (GOWNS) ×1 IMPLANT
GOWN STRL NON-REIN 2XL LVL4 (GOWNS) ×1
GOWN STRL REUS W/ TWL LRG LVL3 (GOWN DISPOSABLE) ×1 IMPLANT
GOWN STRL REUS W/ TWL XL LVL3 (GOWN DISPOSABLE) ×1 IMPLANT
GOWN STRL REUS W/TWL LRG LVL3 (GOWN DISPOSABLE) ×1
GOWN STRL REUS W/TWL XL LVL3 (GOWN DISPOSABLE) ×1
HOLDER FOLEY CATH W/STRAP (MISCELLANEOUS) ×2 IMPLANT
HOOD PEEL AWAY FLYTE STAYCOOL (MISCELLANEOUS) ×4 IMPLANT
INSERT TIBIAL FIXED SZ2 LT (Insert) ×2 IMPLANT
IRRIGATION SURGIPHOR STRL (IV SOLUTION) IMPLANT
IV NS IRRIG 3000ML ARTHROMATIC (IV SOLUTION) ×2 IMPLANT
KIT PREVENA INCISION MGT20CM45 (CANNISTER) ×2 IMPLANT
KIT TURNOVER KIT A (KITS) ×2 IMPLANT
MANIFOLD NEPTUNE II (INSTRUMENTS) ×2 IMPLANT
NDL SAFETY ECLIPSE 18X1.5 (NEEDLE) ×1 IMPLANT
NEEDLE HYPO 18GX1.5 SHARP (NEEDLE) ×1
NEEDLE SPNL 18GX3.5 QUINCKE PK (NEEDLE) ×2 IMPLANT
NEEDLE SPNL 20GX3.5 QUINCKE YW (NEEDLE) ×2 IMPLANT
NS IRRIG 1000ML POUR BTL (IV SOLUTION) ×2 IMPLANT
PACK TOTAL KNEE (MISCELLANEOUS) ×2 IMPLANT
PAD WRAPON POLAR KNEE (MISCELLANEOUS) ×1 IMPLANT
PATELLA RESURFACING MEDACTA 02 (Bone Implant) ×2 IMPLANT
PENCIL SMOKE EVACUATOR COATED (MISCELLANEOUS) ×2 IMPLANT
PULSAVAC PLUS IRRIG FAN TIP (DISPOSABLE) ×2
SCALPEL PROTECTED #10 DISP (BLADE) ×4 IMPLANT
SPONGE T-LAP 18X18 ~~LOC~~+RFID (SPONGE) ×6 IMPLANT
STAPLER SKIN PROX 35W (STAPLE) ×2 IMPLANT
STEM EXTENSION 11MMX30MM (Stem) ×2 IMPLANT
SUCTION FRAZIER HANDLE 10FR (MISCELLANEOUS) ×1
SUCTION TUBE FRAZIER 10FR DISP (MISCELLANEOUS) ×1 IMPLANT
SUT DVC 2 QUILL PDO  T11 36X36 (SUTURE) ×1
SUT DVC 2 QUILL PDO T11 36X36 (SUTURE) ×1 IMPLANT
SUT ETHIBOND 2 V 37 (SUTURE) IMPLANT
SUT V-LOC 90 ABS DVC 3-0 CL (SUTURE) ×2 IMPLANT
SYR 20ML LL LF (SYRINGE) ×2 IMPLANT
SYR 50ML LL SCALE MARK (SYRINGE) ×4 IMPLANT
TIBIAL TRAY FIXED MEDACTA 0207 (Joint) ×2 IMPLANT
TIP FAN IRRIG PULSAVAC PLUS (DISPOSABLE) ×1 IMPLANT
TOWEL OR 17X26 4PK STRL BLUE (TOWEL DISPOSABLE) ×2 IMPLANT
TOWER CARTRIDGE SMART MIX (DISPOSABLE) ×2 IMPLANT
TRAY FOLEY MTR SLVR 16FR STAT (SET/KITS/TRAYS/PACK) ×2 IMPLANT
WRAPON POLAR PAD KNEE (MISCELLANEOUS) ×2

## 2021-06-05 NOTE — Evaluation (Signed)
Physical Therapy Evaluation Patient Details Name: Autumn Burton MRN: [id number] DOB: 1956-07-12 Today's Date: 06/05/2021   History of Present Illness  Autumn Burton is a 41yoF who comes [facility] on 4/13 for elective Left TKA. PMH: bilat knee OA, BLE lymphedema, HTN, LE cellulitis. PTA pt lived alone, uses RW for mobility, difficulty with community distances recently due to knee buckling and fall.  Clinical Impression  Pt admitted with above diagnosis. Pt currently with functional limitations due to the deficits listed below (see "PT Problem List"). Upon entry, pt in bed, awake and agreeable to participate. The pt is alert, pleasant, interactive, and able to provide info regarding prior level of function, both in tolerance and independence. Pt received under 8-9 blankets, shivering in BUE, c/o subjective chills until very end of session mobilizing to chair- room not particularly cool to author. Pt able to moved to EOB without assist, but requires considerable effort and 2-3 minutes. Pt unable to rise to standing, but with ModA and RW can rise to standing. Pt requires ~30sec, physical support, and cues to achieve independence balance with RW. Once steady and balance, pt can take pivot steps to recliner, but needs minA for scoot back into chair. RN in room final 10 minutes of session preparing to examine patient. Patient's performance this date reveals decreased ability, independence, and tolerance in performing all basic mobility required for performance of activities of daily living. Pt requires additional DME, close physical assistance, and cues for safe participate in mobility. Pt will benefit from skilled PT intervention to increase independence and safety with basic mobility in preparation for discharge to the venue listed below.   Pt left up in recliner with legs elevated, foot pumps activated, polar care ready for action, RN at side.       Follow Up Recommendations SNF;Follow surgeon's  recommendation for DC plan and follow-up therapies;Supervision for mobility/OOB    Equipment Recommendations  None recommended by PT    Recommendations for Other Services       Precautions / Restrictions Precautions Precautions: Fall;Knee Precaution Booklet Issued: No Restrictions Weight Bearing Restrictions: Yes LLE Weight Bearing: Weight bearing as tolerated      Mobility  Bed Mobility Overal bed mobility: Needs Assistance Bed Mobility: Supine to Sit     Supine to sit: Supervision     General bed mobility comments: Teacher, early years/pre; Pt uses BUE to self-assist LLE OOB; 2-3 minutes to complete    Transfers Overall transfer level: Needs assistance Equipment used: Rolling walker (2 wheeled) Transfers: Sit to/from [facility] Sit to Stand: Mod assist Stand pivot transfers: Min guard (once standing and steady, moves well with pivot to chair no LOB)       General transfer comment: unable to rise with RW from elevated surface without assist, several attempts.  Ambulation/Gait Ambulation/Gait assistance:  (additrional deferred due to pain, subjective chills, shivering, NSG needs.)              Stairs            Wheelchair Mobility    Modified Rankin (Stroke Patients Only)       Balance                                             Pertinent Vitals/Pain Pain Assessment: 0-10 Pain Score: 5  Pain Location: Operative knee Pain Descriptors / Indicators: Aching  Pain Intervention(s): Limited activity within patient's tolerance;Monitored during session;Premedicated before session;Repositioned;Ice applied    Home Living Family/patient expects to be discharged to:: Skilled nursing facility Living Arrangements: Alone Available Help at Discharge: Family (Son.) Type of Home: House Home Access: Ramped entrance     Home Layout: Able to live on main level with bedroom/bathroom;Laundry or work area in [place]: [name] - single point;Walker - 2 wheels;Bedside commode      Prior Function Level of Independence: Independent with assistive device(s);Needs assistance   Gait / Transfers Assistance Needed: mostly household to limited community distance AMB with RW, recently worse difficulty in knee buckling, has bilat OA in knees  ADL's / Homemaking Assistance Needed: independent ADL; 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        Hand Dominance        Extremity/Trunk Assessment   Upper Extremity Assessment Upper Extremity Assessment: Generalized weakness    Lower Extremity Assessment Lower Extremity Assessment: Generalized weakness       Communication   Communication: No difficulties  Cognition Arousal/Alertness: Awake/alert Behavior During Therapy: WFL for tasks assessed/performed Overall Cognitive Status: Within Functional Limits for tasks assessed                                        General Comments      Exercises Total Joint Exercises Ankle Circles/Pumps: AROM;Both;15 reps Short Arc Quad: AROM;AAROM;Both;15 reps Hip ABduction/ADduction: AAROM;Left;15 reps Goniometric ROM: 10-45 degrees Left knee flexion ROM   Assessment/Plan    PT Assessment Patient needs continued PT services  PT Problem List Decreased strength;Decreased range of motion;Decreased activity tolerance;Decreased balance;Decreased mobility;Decreased cognition;Decreased safety awareness;Decreased knowledge of precautions;Pain       PT Treatment Interventions DME instruction;Gait training;Stair training;Functional mobility training;Therapeutic activities;Therapeutic exercise;Balance training;Patient/family education    PT Goals (Current goals can be found in the Care Plan section)  Acute Rehab PT Goals Patient Stated Goal: get strong, feel good PT Goal Formulation: With patient Time For Goal Achievement: 06/19/21 Potential to Achieve Goals: Good    Frequency BID    Barriers to discharge Inaccessible home environment;Decreased caregiver support      Co-evaluation               AM-PAC PT "6 Clicks" Mobility  Outcome Measure Help needed turning from your back to your side while in a flat bed without using bedrails?: A Little Help needed moving from lying on your back to sitting on the side of a flat bed without using bedrails?: A Lot Help needed moving to and from a bed to a chair (including a wheelchair)?: Total Help needed standing up from a chair using your arms (e.g., wheelchair or bedside chair)?: Total Help needed to walk in hospital room?: A Lot Help needed climbing 3-5 steps with a railing? : Total 6 Click Score: 10    End of Session Equipment Utilized During Treatment: Gait belt Activity Tolerance: Patient tolerated treatment well;No increased pain Patient left: in chair;with nursing/sitter in room;with call bell/phone within reach;with SCD's reapplied Nurse Communication: Mobility status PT Visit Diagnosis: Unsteadiness on feet (R26.81);Difficulty in walking, not elsewhere classified (R26.2);Other abnormalities of gait and mobility (R26.89);History of falling (Z91.81);Muscle weakness (generalized) (M62.81);Other symptoms and signs involving the nervous system (R29.898)    Time: 1527-1610 PT Time Calculation (min) (ACUTE ONLY): 43 min   Charges:   PT Evaluation $PT Eval Moderate Complexity: 1 Mod  PT Treatments $Gait Training: 8-22 mins $Therapeutic Exercise: 8-22 mins       4:29 PM, 06/05/21 [name], PT, DPT Physical Therapist - [facility]  [phone] ([place])    Harwood C 06/05/2021, 4:25 PM

## 2021-06-05 NOTE — Op Note (Signed)
06/05/2021  12:26 PM  PATIENT:  Autumn Burton   MRN: [id number]  PRE-OPERATIVE DIAGNOSIS:  Primary localized osteoarthritis of left knee   POST-OPERATIVE DIAGNOSIS:  Same   PROCEDURE:  Procedure(s): Left TOTAL KNEE ARTHROPLASTY   SURGEON: Laurene Footman, MD   ASSISTANTS: Rachelle Hora, PA-C   ANESTHESIA:   spinal   EBL: 100 cc   BLOOD ADMINISTERED:none   DRAINS:  Incisional wound VAC     LOCAL MEDICATIONS USED:  MARCAINE    and OTHER morphine and Exparel   SPECIMEN:  No Specimen   DISPOSITION OF SPECIMEN:  N/A   COUNTS:  YES   TOURNIQUET: 64 minutes at 300 mm Hg   IMPLANTS: Medacta  GMK sphere system with 2 left femur, 2 left tibia with short stem and 13 mm insert.  Size 2 patella, all components cemented.   DICTATION: Viviann Spare Dictation   patient was brought to the operating room and spinal anesthesia was obtained.  After prepping and draping the left leg in sterile fashion, and after patient identification and timeout procedures were completed, tourniquet was raised  and midline skin incision was made followed by medial parapatellar arthrotomy with severe medial compartment osteoarthritis, severe patellofemoral arthritis and moderate lateral compartment arthritis, partial synovectomy was also carried out.   The ACL and PCL and fat pad were excised along with anterior horns of the meniscus. The proximal tibia cutting guide from  the [facility] system was applied and the proximal tibia cut carried out.  The distal femoral cut was carried out in a similar fashion     The 2 femoral cutting guide applied with anterior posterior and chamfer cuts made.  The posterior horns of the menisci were removed at this point.   Injection of the above medication was carried out after the femoral and tibial cuts were carried out.  The 2 baseplate trial was placed pinned into position and proximal tibial preparation carried out with drilling hand reaming and the keel punch followed by placement of the  2 femur and sizing the tibial insert size 13 millimeter gave the best fit with stability and full extension.  The distal femoral drill holes were made in the notch cut for the trochlear groove was then carried out with trials were then removed the patella was cut using the patellar cutting guide and it sized to a size 2after drill holes have been made  The knee was irrigated with pulsatile lavage and the bony surfaces dried the tibial component was cemented into place first.  Excess cement was removed and the polyethylene insert placed with a torque screw placed with a torque screwdriver tightened.  The distal femoral component was placed and the knee was held in extension as the patellar button was clamped into place.  After the cement was set, excess cement was removed and the knee was again irrigated thoroughly thoroughly irrigated.  The tourniquet was let down and hemostasis checked with electrocautery. The arthrotomy was repaired with a heavy Quill suture,  followed by 3-0 V lock subcuticular closure, skin staples followed by incisional wound VAC and Polar Care.Marland Kitchen   PLAN OF CARE: Admit to inpatient    PATIENT DISPOSITION:  PACU - hemodynamically stable.

## 2021-06-05 NOTE — Progress Notes (Signed)
OT Cancellation Note  Patient Details Name: Autumn Burton MRN: [id number] DOB: November 01, 1956   Cancelled Treatment:    Reason Eval/Treat Not Completed: Patient declined, no reason specified. Consult received, chart reviewed. Upon attempt, pt sleepy/groggy, endorses having had pain meds recently and politely requests OT evaluation next date.  Hanley Hays, MPH, MS, OTR/L ascom [phone] 06/05/21, 4:36 PM

## 2021-06-05 NOTE — Transfer of Care (Signed)
Immediate Anesthesia Transfer of Care Note  Patient: Autumn Burton  Procedure(s) Performed: TOTAL KNEE ARTHROPLASTY (Left: Knee)  Patient Location: PACU  Anesthesia Type:Spinal  Level of Consciousness: awake, alert  and oriented  Airway & Oxygen Therapy: Patient Spontanous Breathing and Patient connected to face mask oxygen  Post-op Assessment: Report given to RN and Post -op Vital signs reviewed and stable  Post vital signs: stable  Last Vitals:  Vitals Value Taken Time  BP 111/58 06/05/21 1227  Temp    Pulse 80 06/05/21 1229  Resp 25 06/05/21 1229  SpO2 100 % 06/05/21 1229  Vitals shown include unvalidated device data.  Last Pain:  Vitals:   06/05/21 0818  TempSrc: Temporal  PainSc: 3       Patients Stated Pain Goal: 0 ([id number])  Complications: No notable events documented.

## 2021-06-05 NOTE — Anesthesia Procedure Notes (Signed)
Spinal  Patient location during procedure: OR Start time: 06/05/2021 10:24 AM End time: 06/05/2021 10:28 AM Reason for block: surgical anesthesia Staffing Performed: resident/CRNA  Anesthesiologist: Piscitello, Precious Haws, MD Resident/CRNA: Natasha Mead, CRNA Preanesthetic Checklist Completed: patient identified, IV checked, site marked, risks and benefits discussed, surgical consent, monitors and equipment checked, pre-op evaluation and timeout performed Spinal Block Patient position: sitting Prep: DuraPrep Patient monitoring: heart rate, cardiac monitor, continuous pulse ox and blood pressure Approach: midline Location: L3-4 Injection technique: single-shot Needle Needle type: Sprotte  Needle gauge: 24 G Needle length: 9 cm Assessment Sensory level: T4 Events: CSF return

## 2021-06-05 NOTE — H&P (Signed)
Chief Complaint  Patient presents with   Left Knee - Pain, Follow-up   Right Knee - Pain, Follow-up    History of the Present Illness: Autumn Burton is a 65 y.o. female here today.   The patient presents with bilateral knee osteoarthritis but equivalent knee has complete loss of all cartilage and significant erosion of the medial compartment. She is scheduled to undergo surgery of her left knee on 06/05/2021. The patients BMI is over 50.  I have reviewed past medical, surgical, social and family history, and allergies as documented in the EMR.  Past Medical History: Past Medical History:  Diagnosis Date   Hypertension   Lymphedema   Migraine headache   Past Surgical History: Past Surgical History:  Procedure Laterality Date   compound fracture right leg 11/12/2003   MASTECTOMY PARTIAL / LUMPECTOMY Left   Right Leg Compund Fracture Repair   Past Family History: Family History  Problem Relation Age of Onset   High blood pressure (Hypertension) Mother   Myocardial Infarction (Heart attack) Mother   Alzheimer's disease Father   Diabetes Sister   High blood pressure (Hypertension) Sister   Medications: Current Outpatient Medications Ordered in Epic  Medication Sig Dispense Refill   acetaminophen (TYLENOL) 500 MG tablet Take 500 mg by mouth every 6 (six) hours as needed   aspirin 81 MG EC tablet Take 81 mg by mouth once daily.   folic acid (FOLVITE) 1 MG tablet Take 1 mg by mouth once daily   gabapentin (NEURONTIN) 300 MG capsule Take 300 mg by mouth 3 (three) times a day   halobetasol (ULTRAVATE) 0.05 % ointment   hydroCHLOROthiazide (HYDRODIURIL) 25 MG tablet Take 1 tablet (25 mg total) by mouth once daily 90 tablet 2   methotrexate (RHEUMATREX) 2.5 MG tablet Take 8 tablets (20 mg total) by mouth every 7 (seven) days 96 tablet 1   metoprolol tartrate (LOPRESSOR) 25 MG tablet Take 1 tablet (25 mg total) by mouth 2 (two) times daily 180 tablet 2   miscellaneous medical  supply Misc Frequency:PHARMDIR Dosage:0.0 Instructions: Note:20 -30s Knee high compression stockings Dose: MISC   olmesartan (BENICAR) 20 MG tablet Take 1 tablet (20 mg total) by mouth once daily 90 tablet 2   potassium chloride (KLOR-CON) 10 mEq ER tablet Take 1 tablet (10 mEq total) by mouth once daily 90 tablet 2   TORsemide (DEMADEX) 20 MG tablet Take 2 tablets (40 mg total) by mouth once daily 180 tablet 2   triamcinolone 0.1 % ointment Apply topically 2 (two) times daily. 454 g 1   No current Epic-ordered facility-administered medications on file.   Allergies: Allergies  Allergen Reactions   Latex, Natural Rubber Hives  Redness, itching    Body mass index is 50.67 kg/m.  Review of Systems: A comprehensive 14 point ROS was performed, reviewed, and the pertinent orthopaedic findings are documented in the HPI.  Vitals:  05/11/21 0848  BP: 138/76    General Physical Examination:   General/Constitutional: No apparent distress: well-nourished and well developed. Eyes: Pupils equal, round with synchronous movement. Lungs:  Clear to auscultation HEENT:  Normal Vascular: No edema, swelling or tenderness, except as noted in detailed exam. Cardiac: Heart rate and rhythm is regular. Integumentary: No impressive skin lesions present, except as noted in detailed exam. Neuro/Psych: Normal mood and affect, oriented to person, place and time.  Musculoskeletal Examination:  Marked crepitus on range of motion. Flexion is 20-80. 4+ edema in lower extremities.  Radiographs:  No new  imaging studies were obtained or reviewed today.  Assessment: ICD-10-CM  1. Primary osteoarthritis of both knees M17.0   Plan: We discussed her condition and left knee surgery. The patient will be seen on 06/05/2021 for surgery.  The nature of the condition and the proposed procedure has been reviewed in detail with the patient. Surgical versus non-surgical options and prognosis for recovery have been  reviewed and the inherent risks and benefits of each have been discussed including the risks of infection, bleeding, injury to nerves/blood vessels/tendons, incomplete relief of symptoms, persisting pain and/or stiffness, loss of function, complex regional pain syndrome, failure of the procedure, as appropriate.  Teeth: Upper and Lower Dentures.  Attestation: Devota Pace, am documenting for [facility], MD utilizing [employer].    Electronically signed by Lauris Poag, MD at 05/12/2021 5:07 PM EDT  Reviewed  H+P. No changes noted.

## 2021-06-05 NOTE — Anesthesia Preprocedure Evaluation (Signed)
Anesthesia Evaluation  Patient identified by MRN, date of birth, ID band Patient awake    Reviewed: Allergy & Precautions, NPO status , Patient's Chart, lab work & pertinent test results  History of Anesthesia Complications Negative for: history of anesthetic complications  Airway Mallampati: III  TM Distance: >3 FB Neck ROM: full    Dental  (+) Upper Dentures, Lower Dentures   Pulmonary neg pulmonary ROS, neg shortness of breath,    Pulmonary exam normal        Cardiovascular Exercise Tolerance: Good hypertension, (-) angina(-) DOE Normal cardiovascular exam     Neuro/Psych  Neuromuscular disease negative psych ROS   GI/Hepatic Neg liver ROS, GERD  Medicated and Controlled,  Endo/Other  Hyperthyroidism   Renal/GU Renal disease     Musculoskeletal  (+) Arthritis ,   Abdominal   Peds  Hematology negative hematology ROS (+)   Anesthesia Other Findings Past Medical History: No date: Anemia No date: Arthritis No date: GERD (gastroesophageal reflux disease) No date: Hypertension No date: Lymphedema of both lower extremities     Comment:  being followed by Dr Rhoderick Moody Boots bil No date: Venous ulcer of right leg (HCC)     Comment:  thigh-being followed by dr dew  Past Surgical History: 10/28/2016: COLONOSCOPY WITH PROPOFOL; N/A     Comment:  Procedure: COLONOSCOPY WITH PROPOFOL;  Surgeon: Jonathon Bellows, MD;  Location: [facility] ENDOSCOPY;  Service: Endoscopy;              Laterality: N/A; No date: DG RIGHT TIBIA AND FIBULA ([place] HX) No date: LEG SURGERY; Right  BMI    Body Mass Index: 48.59 kg/m      Reproductive/Obstetrics negative OB ROS                             Anesthesia Physical Anesthesia Plan  ASA: 3  Anesthesia Plan: Spinal   Post-op Pain Management:    Induction:   PONV Risk Score and Plan:   Airway Management Planned: Natural Airway and Nasal  Cannula  Additional Equipment:   Intra-op Plan:   Post-operative Plan:   Informed Consent: I have reviewed the patients History and Physical, chart, labs and discussed the procedure including the risks, benefits and alternatives for the proposed anesthesia with the patient or authorized representative who has indicated his/her understanding and acceptance.     Dental Advisory Given  Plan Discussed with: Anesthesiologist, CRNA and Surgeon  Anesthesia Plan Comments: (Patient reports no bleeding problems and no anticoagulant use.  Plan for spinal with backup GA  Patient consented for risks of anesthesia including but not limited to:  - adverse reactions to medications - damage to eyes, teeth, lips or other oral mucosa - nerve damage due to positioning  - risk of bleeding, infection and or nerve damage from spinal that could lead to paralysis - risk of headache or failed spinal - damage to teeth, lips or other oral mucosa - sore throat or hoarseness - damage to heart, brain, nerves, lungs, other parts of body or loss of life  Patient voiced understanding.)        Anesthesia Quick Evaluation

## 2021-06-06 ENCOUNTER — Encounter: Payer: Self-pay | Admitting: Orthopedic Surgery

## 2021-06-06 LAB — BASIC METABOLIC PANEL
Anion gap: 9 (ref 5–15)
BUN: 34 mg/dL — ABNORMAL HIGH (ref 8–23)
CO2: 26 mmol/L (ref 22–32)
Calcium: 8.3 mg/dL — ABNORMAL LOW (ref 8.9–10.3)
Chloride: 100 mmol/L (ref 98–111)
Creatinine, Ser: 1.75 mg/dL — ABNORMAL HIGH (ref 0.44–1.00)
GFR, Estimated: 32 mL/min — ABNORMAL LOW (ref >60)
Glucose, Bld: 168 mg/dL — ABNORMAL HIGH (ref 70–99)
Potassium: 4.2 mmol/L (ref 3.5–5.1)
Sodium: 135 mmol/L (ref 135–145)

## 2021-06-06 LAB — CBC
HCT: 26.6 % — ABNORMAL LOW (ref 36.0–46.0)
Hemoglobin: 8.6 g/dL — ABNORMAL LOW (ref 12.0–15.0)
MCH: 31.5 pg (ref 26.0–34.0)
MCHC: 32.3 g/dL (ref 30.0–36.0)
MCV: 97.4 fL (ref 80.0–100.0)
Platelets: 207 10*3/uL (ref 150–400)
RBC: 2.73 MIL/uL — ABNORMAL LOW (ref 3.87–5.11)
RDW: 15.2 % (ref 11.5–15.5)
WBC: 7.3 10*3/uL (ref 4.0–10.5)
nRBC: 0 % (ref 0.0–0.2)

## 2021-06-06 MED ORDER — FE FUMARATE-B12-VIT C-FA-IFC PO CAPS
1.0000 | ORAL_CAPSULE | Freq: Two times a day (BID) | ORAL | Status: DC
  Administered 2021-06-06 – 2021-06-08 (×5): 1 via ORAL
  Filled 2021-06-06 (×6): qty 1

## 2021-06-06 NOTE — TOC Progression Note (Addendum)
Transition of Care [facility]) - Progression Note    Patient Details  Name: Autumn Burton MRN: [id number] Date of Birth: January 10, 1956  Transition of Care [facility]) CM/SW [place], RN Phone Number: 06/06/2021, 1:15 PM  Clinical Narrative:     Jeralene Huff out to [facility] in [place] to request a bed offer, spoke with Otila Kluver, faxed information to [phone], awaiting a bed offer       Expected Discharge Plan and Services                                                 Social Determinants of Health (SDOH) Interventions    Readmission Risk Interventions No flowsheet data found.

## 2021-06-06 NOTE — TOC Initial Note (Addendum)
Transition of Care [facility]) - Progression Note    Patient Details  Name: Autumn Burton MRN: [id number] Date of Birth: 1956-06-12  Transition of Care [facility]) CM/SW Contact  Su Hilt, RN Phone Number: 06/06/2021, 8:51 AM  Clinical Narrative:     Met with the patient at the bedside, she stated she will need to go to STR SNF due to living alone and not having help at home, I explained to her that she will also need a medical need to meet insurance criteria, she has been recommended to go to STR by PT, she does not want to go to [facility], or [facility], or [facility], she would like [facility], they do not have a bed until tomorrow but will review the patient to see if they can offer her that bed,  she would like to go to [facility], they do not have any beds, she stated she does not want to go to [facility] due to not having private rooms, I explained to her that only left [facility] as a local facility, she would ike to go to [facility] I am awaiting a response with a bed offer, she has had 2 covid vaccines and 2 covid boosters  Update, [facility] unable to make a bed offer       Expected Discharge Plan and Services                                                 Social Determinants of Health (SDOH) Interventions    Readmission Risk Interventions No flowsheet data found.

## 2021-06-06 NOTE — Progress Notes (Signed)
   Subjective: 1 Day Post-Op Procedure(s) (LRB): TOTAL KNEE ARTHROPLASTY (Left) Patient reports pain as 6 on 0-10 scale.   Patient is well, and has had no acute complaints or problems Denies any CP, SOB, ABD pain. We will continue therapy today.  Plan is to go Skilled nursing facility after hospital stay.  Objective: Vital signs in last 24 hours: Temp:  [97.2 F (36.2 C)-98.8 F (37.1 C)] 98.8 F (37.1 C) (07/27 0536) Pulse Rate:  [80-107] 107 (07/27 0536) Resp:  [15-21] 18 (07/27 0536) BP: (103-131)/(47-72) 122/49 (07/27 0536) SpO2:  [99 %-100 %] 100 % (07/27 0536) Weight:  [120.5 kg] 120.5 kg (07/26 0818)  Intake/Output from previous day: 07/26 0701 - 07/27 0700 In: 2405.1 [I.V.:2155.1; IV Piggyback:250] Out: 1750 [Urine:1500; Drains:150; Blood:100] Intake/Output this shift: No intake/output data recorded.  Recent Labs    06/05/21 1508 06/06/21 0548  HGB 10.0* 8.6*   Recent Labs    06/05/21 1508 06/06/21 0548  WBC 11.5* 7.3  RBC 3.12* 2.73*  HCT 30.6* 26.6*  PLT 219 207   Recent Labs    06/05/21 1508 06/06/21 0548  NA  --  135  K  --  4.2  CL  --  100  CO2  --  26  BUN  --  34*  CREATININE 1.95* 1.75*  GLUCOSE  --  168*  CALCIUM  --  8.3*   No results for input(s): LABPT, INR in the last 72 hours.  EXAM General - Patient is Alert, Appropriate, and Oriented Extremity - Neurovascular intact Sensation intact distally Intact pulses distally Dorsiflexion/Plantar flexion intact No cellulitis present Compartment soft Dressing - dressing C/D/I, 300 cc drainage and Praveena Motor Function - intact, moving foot and toes well on exam.   Past Medical History:  Diagnosis Date   Anemia    Arthritis    GERD (gastroesophageal reflux disease)    Hypertension    Lymphedema of both lower extremities    being followed by Dr Rhoderick Moody Boots bil   Venous ulcer of right leg ([place])    thigh-being followed by dr dew    Assessment/Plan:   1 Day Post-Op  Procedure(s) (LRB): TOTAL KNEE ARTHROPLASTY (Left) Active Problems:   S/P TKR (total knee replacement) using cement, left  Estimated body mass index is 48.59 kg/m as calculated from the following:   Height as of this encounter: '5\' 2"'$  (1.575 m).   Weight as of this encounter: 120.5 kg. Advance diet Up with therapy Pain well controlled Acute postop blood loss anemia-hemoglobin 8.6.  Start iron supplement Recheck labs in the morning Vital signs stable, patient slightly tachycardic.  No complaints of chest pain or shortness of breath.  Continue with gentle IV hydration Encourage incentive spirometer Work on Psychiatrist to assist with discharge to skilled nursing facility.  DVT Prophylaxis - Lovenox, Foot Pumps, and TED hose Weight-Bearing as tolerated to left leg   T. Rachelle Hora, PA-C [place] 06/06/2021, 7:36 AM

## 2021-06-06 NOTE — Anesthesia Postprocedure Evaluation (Signed)
Anesthesia Post Note  Patient: Hazelee A Dec  Procedure(s) Performed: TOTAL KNEE ARTHROPLASTY (Left: Knee)  Patient location during evaluation: Nursing Unit Anesthesia Type: Spinal Level of consciousness: oriented and awake and alert Pain management: pain level controlled Vital Signs Assessment: post-procedure vital signs reviewed and stable Respiratory status: spontaneous breathing and respiratory function stable Cardiovascular status: blood pressure returned to baseline and stable Postop Assessment: no headache, no backache, no apparent nausea or vomiting and patient able to bend at knees Anesthetic complications: no   No notable events documented.   Last Vitals:  Vitals:   06/06/21 0009 06/06/21 0536  BP: 130/72 (!) 122/49  Pulse: (!) 106 (!) 107  Resp: 18 18  Temp: 36.7 C 37.1 C  SpO2: 100% 100%    Last Pain:  Vitals:   06/05/21 2000  TempSrc:   PainSc: 0-No pain                 Rosebud Poles C

## 2021-06-06 NOTE — Progress Notes (Signed)
Physical Therapy Treatment Patient Details Name: Autumn Burton MRN: [id number] DOB: 03-Aug-1956 Today's Date: 06/06/2021    History of Present Illness Autumn Burton is a 66yoF who comes to [facility] on 4/13 for elective Left TKA. PMH: bilat knee OA, BLE lymphedema, HTN, LE cellulitis. PTA pt lived alone, uses RW for mobility, difficulty with community distances recently due to knee buckling and fall.    PT Comments    Pt still up in recliner, finishing toilet activity with NA. Pt asks to focus session on improving technique and independence with STS transfers. Blanket added to recliner for surface elevation. Pt able to STS 6x with modified 2 stage technique, hips fully up first, then trunk/hip extension up after transition of hands from recliner to RW. Pt shows improvement in effort, speed, and confidence over time. Pt assisted with gait belt set-up for LAQ from recliner, but quads activation remains very limited. Pt left with legs down, encouraged to keep down for 15-20 minutes,work on flexion ROM, as well as quads-oriented HEP. Pt reports able to elevate her legs independently without assist. RN made aware.    Follow Up Recommendations  SNF;Follow surgeon's recommendation for DC plan and follow-up therapies;Supervision for mobility/OOB     Equipment Recommendations  None recommended by PT    Recommendations for Other Services       Precautions / Restrictions Precautions Precautions: Fall;Knee Precaution Booklet Issued: Yes (comment) Restrictions LLE Weight Bearing: Weight bearing as tolerated    Mobility  Bed Mobility               General bed mobility comments: in recliner upon entry    Transfers Overall transfer level: Needs assistance Equipment used: Standard walker;None (bari SW (for width)) Transfers: Sit to/from Stand Sit to Stand: From elevated surface;Min guard;Supervision            Ambulation/Gait                 Stairs              Wheelchair Mobility    Modified Rankin (Stroke Patients Only)       Balance                                            Cognition Arousal/Alertness: Awake/alert Behavior During Therapy: WFL for tasks assessed/performed Overall Cognitive Status: Within Functional Limits for tasks assessed                                        Exercises Total Joint Exercises Long Arc QuadSinclair Ship;Left;Seated;Limitations;15 reps Long Arc Quad Limitations: with gait belt around foot    General Comments        Pertinent Vitals/Pain Pain Assessment: Faces Faces Pain Scale: Hurts little more Pain Location: trnsitional pain during transfers Pain Descriptors / Indicators: Aching Pain Intervention(s): Limited activity within patient's tolerance    Home Living                      Prior Function            PT Goals (current goals can now be found in the care plan section) Acute Rehab PT Goals Patient Stated Goal: get strong, feel good PT Goal Formulation: With patient Time For Goal Achievement: 06/19/21 Potential  to Achieve Goals: Good Progress towards PT goals: Progressing toward goals    Frequency    BID      PT Plan Current plan remains appropriate    Co-evaluation              AM-PAC PT "6 Clicks" Mobility   Outcome Measure  Help needed turning from your back to your side while in a flat bed without using bedrails?: A Little Help needed moving from lying on your back to sitting on the side of a flat bed without using bedrails?: A Lot Help needed moving to and from a bed to a chair (including a wheelchair)?: A Lot Help needed standing up from a chair using your arms (e.g., wheelchair or bedside chair)?: A Lot Help needed to walk in hospital room?: A Lot Help needed climbing 3-5 steps with a railing? : Total 6 Click Score: 12    End of Session   Activity Tolerance: Patient tolerated treatment well;No increased  pain Patient left: in chair;with call bell/phone within reach Nurse Communication: Mobility status PT Visit Diagnosis: Unsteadiness on feet (R26.81);Difficulty in walking, not elsewhere classified (R26.2);Other abnormalities of gait and mobility (R26.89);History of falling (Z91.81);Muscle weakness (generalized) (M62.81);Other symptoms and signs involving the nervous system (R29.898)     Time: [id number] PT Time Calculation (min) (ACUTE ONLY): 33 min  Charges:  $Therapeutic Exercise: 23-37 mins                    4:23 PM, 06/06/21 [name], PT, DPT Physical Therapist - [facility]  [phone] ([place])    [place] C 06/06/2021, 4:19 PM

## 2021-06-06 NOTE — Evaluation (Signed)
Occupational Therapy Evaluation Patient Details Name: Autumn Burton MRN: [id number] DOB: 1956-10-28 Today's Date: 06/06/2021    History of Present Illness Autumn Burton is a 51yoF who comes to [facility] on 4/13 for elective Left TKA. PMH: bilat knee OA, BLE lymphedema, HTN, LE cellulitis. PTA pt lived alone, uses RW for mobility, difficulty with community distances recently due to knee buckling and fall.   Clinical Impression   Pt seen for OT evaluation this date in setting of acute hosptialziation 2/2 elective L TKA. Pt presents this date with knee pain in addition to some baseline weakness, impacting her ability to safely and efficiently perform ADLs/ADL mobility. Pt requires MIN A for ADL transfers with RW (bari 2WW, short). Pt with decent effort to complete steps from standing at EOB to recliner adjacent, but ultimately requires MIN/MOD A to shuffle x~6-7 to get from bed to chair. Upon ADL assessment, pt note to require: SETUP to MIN A for seated UB ADLs and MOD/MAX A for seated LB ADLs. Pt left with all needs met and in reach. Requesting pain meds and RN administers. Will continue to follow acutely. Anticipate pt will require STR in SNF setting before returning home 2/2 deconditioning and currently requiring increased ADL assistance for safety.    Follow Up Recommendations  SNF    Equipment Recommendations  3 in 1 bedside commode;Tub/shower seat;Other (comment) (2ww bari, short)    Recommendations for Other Services       Precautions / Restrictions Precautions Precautions: Fall;Knee Precaution Booklet Issued: Yes (comment) Restrictions Weight Bearing Restrictions: Yes LLE Weight Bearing: Weight bearing as tolerated      Mobility Bed Mobility Overal bed mobility: Needs Assistance Bed Mobility: Supine to Sit     Supine to sit: Min guard;Min assist;HOB elevated     General bed mobility comments: increased time, assist for L LE    Transfers Overall transfer level: Needs  assistance Equipment used: Rolling walker (2 wheeled) Transfers: Sit to/from Stand Sit to Stand: Min guard;Min assist Stand pivot transfers: Min assist       General transfer comment: increased time, slightly elevated EOB (~1-2"), cues for hand/foot placement    Balance Overall balance assessment: Mild deficits observed, not formally tested Sitting-balance support: Feet supported Sitting balance-Leahy Scale: Good       Standing balance-Leahy Scale: Poor Standing balance comment: requires UE support as well as external support for fxl mobility.                           ADL either performed or assessed with clinical judgement   ADL                                         General ADL Comments: Pt requires SETUP to MIN A for seated UB ADLs, MOD/MAX A for seated LB ADLs, MIN A for ADL transfers with RW.     Vision Baseline Vision/History: Wears glasses Wears Glasses: At all times Patient Visual Report: No change from baseline       Perception     Praxis      Pertinent Vitals/Pain Pain Assessment: 0-10 Pain Score: 7  Faces Pain Scale: Hurts little more Pain Location: pain in L LE during transfers Pain Descriptors / Indicators: Operative site guarding;Sore Pain Intervention(s): Limited activity within patient's tolerance;Monitored during session;Patient requesting pain meds-RN notified;RN gave pain meds during  session     Hand Dominance     Extremity/Trunk Assessment Upper Extremity Assessment Upper Extremity Assessment: Overall WFL for tasks assessed;Generalized weakness   Lower Extremity Assessment Lower Extremity Assessment: Generalized weakness;Defer to PT evaluation (expected post op L LE knee limitations including decrased flexion and decreased hip rotation tolerance required to complete LB ADLs.)       Communication Communication Communication: No difficulties   Cognition Arousal/Alertness: Awake/alert Behavior During  Therapy: WFL for tasks assessed/performed Overall Cognitive Status: Within Functional Limits for tasks assessed                                     General Comments       Exercises Total Joint Exercises Long Arc Quad: AAROM;Left;Seated;Limitations;15 reps Long Arc Quad Limitations: with gait belt around foot Other Exercises Other Exercises: OT ed re: polar care and compression stocking mgt, use of AE for LB ADLs, importance of OOB activity, rationale of wound vac, rationale of SCDs. Importance of positioning L LE in extension while resting to prevent shortening of connective tissue in healing. Pt with good understanding   Shoulder Instructions      Home Living Family/patient expects to be discharged to:: Skilled nursing facility Living Arrangements: Alone Available Help at Discharge: Family;Available PRN/intermittently (son) Type of Home: House Home Access: Ramped entrance     Home Layout: Able to live on main level with bedroom/bathroom;Laundry or work area in [place]: [name] - single point;Walker - 2 wheels;Bedside commode          Prior Functioning/Environment Level of Independence: Independent with assistive device(s);Needs assistance  Gait / Transfers Assistance Needed: mostly household to limited community distance AMB with RW, recently worse difficulty in knee buckling, has bilat OA in knees ADL's / Homemaking Assistance Needed: independent ADL; 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            OT Problem List: Decreased strength;Decreased activity tolerance;Impaired balance (sitting and/or standing);Decreased knowledge of use of DME or AE;Pain      OT Treatment/Interventions: Self-care/ADL training;Therapeutic activities;Therapeutic exercise;Energy conservation;DME and/or AE instruction    OT Goals(Current goals can be found in the care plan section) Acute Rehab OT Goals Patient Stated Goal: get strong,  feel good OT Goal Formulation: With patient Time For Goal Achievement: 06/20/21 Potential to Achieve Goals: Good ADL Goals Pt Will Perform Grooming: with supervision;with min guard assist;standing (sink side with LRAD to complete 2-3 g/h tasks to increase standing fxl activity tolerance) Pt Will Perform Lower Body Dressing: with min assist;sit to/from stand;with adaptive equipment Pt Will Transfer to Toilet: with min guard assist;with min assist;ambulating;bedside commode (with LRAD to [facility] ~5-10' away to improve tolerance for fxl HH distances) Pt Will Perform Toileting - Clothing Manipulation and hygiene: with min guard assist;with min assist Pt/caregiver will Perform Home Exercise Program: Increased strength;Both right and left upper extremity;With Supervision  OT Frequency: Min 2X/week   Barriers to D/C:            Co-evaluation              AM-PAC OT "6 Clicks" Daily Activity     Outcome Measure Help from another person eating meals?: None Help from another person taking care of personal grooming?: A Little Help from another person toileting, which includes using toliet, bedpan, or urinal?: A  Little Help from another person bathing (including washing, rinsing, drying)?: A Little Help from another person to put on and taking off regular upper body clothing?: None Help from another person to put on and taking off regular lower body clothing?: A Lot 6 Click Score: 19   End of Session Equipment Utilized During Treatment: Gait belt;Rolling walker Nurse Communication: Mobility status;Patient requests pain meds  Activity Tolerance: Patient tolerated treatment well Patient left: in chair;with call bell/phone within reach;with chair alarm set  OT Visit Diagnosis: Unsteadiness on feet (R26.81);Muscle weakness (generalized) (M62.81)                Time: 1548-8457 OT Time Calculation (min): 47 min Charges:  OT General Charges $OT Visit: 1 Visit OT Evaluation $OT Eval Moderate  Complexity: 1 Mod OT Treatments $Self Care/Home Management : 8-22 mins $Therapeutic Activity: 8-22 mins  Gerrianne Scale, MS, OTR/L ascom [phone] 06/06/21, 4:43 PM

## 2021-06-06 NOTE — TOC Progression Note (Signed)
Transition of Care [facility]) - Progression Note    Patient Details  Name: Autumn Burton MRN: [id number] Date of Birth: 08-11-1956  Transition of Care [facility]) CM/SW [place], RN Phone Number: 06/06/2021, 3:29 PM  Clinical Narrative:    Per patient request I sent the referral to Seth Bake at [facility], awaiting a responce        Expected Discharge Plan and Services                                                 Social Determinants of Health (SDOH) Interventions    Readmission Risk Interventions No flowsheet data found.

## 2021-06-06 NOTE — Progress Notes (Signed)
Physical Therapy Treatment Patient Details Name: Autumn Burton MRN: [id number] DOB: 1956-10-26 Today's Date: 06/06/2021    History of Present Illness Micha Gessel is a 22yoF who comes to [facility] on 4/13 for elective Left TKA. PMH: bilat knee OA, BLE lymphedema, HTN, LE cellulitis. PTA pt lived alone, uses RW for mobility, difficulty with community distances recently due to knee buckling and fall.    PT Comments    Pt in recliner at entry, agreeable to session. Cont with HEP education. Pt requires less assistance with transfers to standing from recliner. Pt able to AMB to doorway slowly with chair follow, but is limited by fatigue more than pain in LLE. Pain well controlled. Pt left up in recliner at end of session. Pt still not independent enough to warrant safe DC to home at this time would benefit from STR stay prior to return to home to optimize safety and independence.   Follow Up Recommendations  SNF;Follow surgeon's recommendation for DC plan and follow-up therapies;Supervision for mobility/OOB     Equipment Recommendations  None recommended by PT    Recommendations for Other Services       Precautions / Restrictions Precautions Precautions: Fall;Knee Precaution Booklet Issued: Yes (comment) Restrictions Weight Bearing Restrictions: Yes LLE Weight Bearing: Weight bearing as tolerated    Mobility  Bed Mobility               General bed mobility comments: in recliner upon entry    Transfers Overall transfer level: Needs assistance Equipment used: Rolling walker (2 wheeled) Transfers: Sit to/from Stand Sit to Stand: Min guard            Ambulation/Gait Ambulation/Gait assistance: Min guard (chair follow) Gait Distance (Feet): 25 Feet Assistive device: Rolling walker (2 wheeled) Gait Pattern/deviations: Step-to pattern     General Gait Details: labored, fatigued, 3-point gait, left knee pain limiting   Stairs             Wheelchair  Mobility    Modified Rankin (Stroke Patients Only)       Balance Overall balance assessment: Mild deficits observed, not formally tested                                          Cognition Arousal/Alertness: Awake/alert Behavior During Therapy: WFL for tasks assessed/performed Overall Cognitive Status: Within Functional Limits for tasks assessed                                        Exercises Total Joint Exercises Ankle Circles/Pumps: AROM;Both;15 reps Quad Sets: AROM;Limitations Quad Sets Limitations: unable to grasp this concept, then deferred Short Arc Quad: AAROM Heel Slides: AROM;Left;Seated;Limitations Heel Slides Limitations: limited due to weakness Long Arc Quad: AAROM;Left;Seated;Limitations Long [employer] Limitations: 15x RT; 10x Lt with gait belt around foot Goniometric ROM: 10-63 degrees Lt knee flexion ROM    General Comments        Pertinent Vitals/Pain Pain Assessment: 0-10 Pain Score: 4  Pain Location: Operative knee Pain Descriptors / Indicators: Aching Pain Intervention(s): Limited activity within patient's tolerance;Monitored during session;Premedicated before session    Home Living                      Prior Function  PT Goals (current goals can now be found in the care plan section) Acute Rehab PT Goals Patient Stated Goal: get strong, feel good PT Goal Formulation: With patient Time For Goal Achievement: 06/19/21 Potential to Achieve Goals: Good Progress towards PT goals: Progressing toward goals    Frequency    BID      PT Plan Current plan remains appropriate    Co-evaluation              AM-PAC PT "6 Clicks" Mobility   Outcome Measure  Help needed turning from your back to your side while in a flat bed without using bedrails?: A Little Help needed moving from lying on your back to sitting on the side of a flat bed without using bedrails?: A Lot Help needed moving  to and from a bed to a chair (including a wheelchair)?: A Lot Help needed standing up from a chair using your arms (e.g., wheelchair or bedside chair)?: A Lot Help needed to walk in hospital room?: A Lot Help needed climbing 3-5 steps with a railing? : Total 6 Click Score: 12    End of Session Equipment Utilized During Treatment: Gait belt Activity Tolerance: Patient tolerated treatment well;No increased pain;Patient limited by fatigue Patient left: in chair;with call bell/phone within reach Nurse Communication: Mobility status PT Visit Diagnosis: Unsteadiness on feet (R26.81);Difficulty in walking, not elsewhere classified (R26.2);Other abnormalities of gait and mobility (R26.89);History of falling (Z91.81);Muscle weakness (generalized) (M62.81);Other symptoms and signs involving the nervous system (R29.898)     Time: [id number] PT Time Calculation (min) (ACUTE ONLY): 34 min  Charges:  $Gait Training: 8-22 mins $Therapeutic Exercise: 8-22 mins                    12:08 PM, 06/06/21 [name], PT, DPT Physical Therapist - [facility]  [phone] ([place])    [place] 06/06/2021, 12:04 PM

## 2021-06-06 NOTE — NC FL2 (Signed)
[place] LEVEL OF CARE SCREENING TOOL     IDENTIFICATION  Patient Name: Autumn Burton Birthdate: 1956/03/16 Sex: female Admission Date (Current Location): 06/05/2021  [facility] and [place] Number:  Engineering geologist and Address:  [facility], [address], [place], [place] [address]      Provider Number: [id number]  Attending Physician Name and Address:  Hessie Knows, MD  Relative Name and Phone Number:  Kerman Passey SOn, [phone]    Current Level of Care: Hospital Recommended Level of Care: [place] Prior Approval Number:    Date Approved/Denied:   PASRR Number: [id number]  Discharge Plan: SNF    Current Diagnoses: Patient Active Problem List   Diagnosis Date Noted   S/P TKR (total knee replacement) using cement, left 06/05/2021   Ulcer of right leg, limited to breakdown of skin ([place]) 05/11/2021   Chronic pain of both knees 11/15/2020   History of abnormal mammogram 04/06/2020   Retaining fluid 04/06/2020   Decreased hemoglobin 04/06/2020   AKI (acute kidney injury) ([place]) 04/06/2020   History of sepsis 03/01/2020   Hypokalemia 03/01/2020   History of rhabdomyolysis 03/01/2020   Rheumatoid arthritis ([place]) 03/01/2020   Peripheral neuropathy 03/01/2020   Hx of colonic polyps    Polyp of sigmoid colon    Morbidly obese ([place]) 03/06/2016   Chronic eczema 01/17/2015   Cough due to ACE inhibitor 01/17/2015   Essential (primary) hypertension 01/17/2015   Secondary osteoarthritis of right ankle 01/17/2015   Acquired lymphedema of leg 11/25/2014   Infection and inflammatory reaction due to internal orthopedic device, implant, and graft ([place]) 05/01/2010   Pseudoarthrosis of cervical spine ([place]) 01/12/2010   Hyperthyroidism, subclinical 01/12/2010   Tachycardia 01/12/2010   Nonunion of fracture 01/12/2010    Orientation RESPIRATION BLADDER Height & Weight     Self, Time, Situation, Place  Normal External  catheter, Continent Weight: 120.5 kg Height:  '5\' 2"'$  (157.5 cm)  BEHAVIORAL SYMPTOMS/MOOD NEUROLOGICAL BOWEL NUTRITION STATUS      Continent Diet (Regular)  AMBULATORY STATUS COMMUNICATION OF NEEDS Skin   Extensive Assist Verbally Surgical wounds                       Personal Care Assistance Level of Assistance  Bathing, Feeding, Dressing Bathing Assistance: Limited assistance Feeding assistance: Independent Dressing Assistance: Limited assistance     Functional Limitations Info             SPECIAL CARE FACTORS FREQUENCY  PT (By licensed PT), OT (By licensed OT)     PT Frequency: 5 times per week OT Frequency: 3 times per week            Contractures Contractures Info: Not present    Additional Factors Info  Code Status, Allergies Code Status Info: FUll code Allergies Info: Latex           Current Medications (06/06/2021):  This is the current hospital active medication list Current Facility-Administered Medications  Medication Dose Route Frequency Provider Last Rate Last Admin   0.9 %  sodium chloride infusion   Intravenous Continuous Hessie Knows, MD 75 mL/hr at 06/05/21 1524 New Bag at 06/05/21 1524   acetaminophen (TYLENOL) tablet 325-650 mg  325-650 mg Oral Q6H PRN Hessie Knows, MD       alum & mag hydroxide-simeth (MAALOX/MYLANTA) 200-200-20 MG/5ML suspension 30 mL  30 mL Oral Q4H PRN Hessie Knows, MD       aspirin EC tablet  81 mg  81 mg Oral Daily Hessie Knows, MD       bisacodyl (DULCOLAX) suppository 10 mg  10 mg Rectal Daily PRN Hessie Knows, MD       calcium carbonate (TUMS - dosed in mg elemental calcium) chewable tablet 200 mg of elemental calcium  1 tablet Oral Daily PRN Hessie Knows, MD       clobetasol ointment (TEMOVATE) [id number] % 1 application  1 application Topical BID PRN Hessie Knows, MD       diphenhydrAMINE (BENADRYL) 12.5 MG/5ML elixir 12.5-25 mg  12.5-25 mg Oral Q4H PRN Hessie Knows, MD       docusate sodium (COLACE) capsule 100  mg  100 mg Oral BID Hessie Knows, MD   100 mg at 06/05/21 2101   enoxaparin (LOVENOX) injection 30 mg  30 mg Subcutaneous Q12H Hessie Knows, MD   30 mg at 06/06/21 0804   ferrous Q000111Q C-folic acid (TRINSICON / FOLTRIN) capsule 1 capsule  1 capsule Oral BID Duanne Guess, PA-C       folic acid (FOLVITE) tablet 1 mg  1 mg Oral Once per day on Mon Tue Wed Thu Fri Sat Hessie Knows, MD       furosemide (LASIX) tablet 20 mg  20 mg Oral Daily Hessie Knows, MD       gabapentin (NEURONTIN) capsule 300 mg  300 mg Oral TID Hessie Knows, MD   300 mg at 06/05/21 2101   hydrochlorothiazide (HYDRODIURIL) tablet 25 mg  25 mg Oral Daily Hessie Knows, MD       HYDROcodone-acetaminophen (NORCO) 7.5-325 MG per tablet 1-2 tablet  1-2 tablet Oral Q4H PRN Hessie Knows, MD       HYDROcodone-acetaminophen (NORCO/VICODIN) 5-325 MG per tablet 1-2 tablet  1-2 tablet Oral Q4H PRN Hessie Knows, MD   2 tablet at 06/05/21 1530   irbesartan (AVAPRO) tablet 75 mg  75 mg Oral Daily Hessie Knows, MD       menthol-cetylpyridinium (CEPACOL) lozenge 3 mg  1 lozenge Oral PRN Hessie Knows, MD       Or   phenol (CHLORASEPTIC) mouth spray 1 spray  1 spray Mouth/Throat PRN Hessie Knows, MD       methocarbamol (ROBAXIN) tablet 500 mg  500 mg Oral Q6H PRN Hessie Knows, MD       Or   methocarbamol (ROBAXIN) 500 mg in dextrose 5 % 50 mL IVPB  500 mg Intravenous Q6H PRN Hessie Knows, MD       Derrill Memo ON 06/10/2021] methotrexate (RHEUMATREX) tablet 15 mg  15 mg Oral Q Lynnette Caffey, Legrand Como, MD       metoprolol tartrate (LOPRESSOR) tablet 25 mg  25 mg Oral Claudine Mouton, Legrand Como, MD   25 mg at 06/06/21 0541   morphine 2 MG/ML injection 0.5-1 mg  0.5-1 mg Intravenous Q2H PRN Hessie Knows, MD       ondansetron [facility]) tablet 4 mg  4 mg Oral Q6H PRN Hessie Knows, MD       Or   ondansetron [facility]) injection 4 mg  4 mg Intravenous Q6H PRN Hessie Knows, MD       polyethylene glycol (MIRALAX / GLYCOLAX) packet 17 g  17 g  Oral Daily PRN Hessie Knows, MD       potassium chloride (KLOR-CON) CR tablet 10 mEq  10 mEq Oral Daily Hessie Knows, MD       sodium phosphate (FLEET) 7-19 GM/118ML enema 1 enema  1 enema Rectal Once PRN Rudene Christians,  Legrand Como, MD       traMADol Veatrice [facility]) tablet 50 mg  50 mg Oral Q6H Hessie Knows, MD   50 mg at 06/06/21 0542   triamcinolone ointment (KENALOG) 0.1 % 1 application  1 application Topical Daily PRN Hessie Knows, MD       zolpidem [facility]) tablet 5 mg  5 mg Oral QHS PRN Hessie Knows, MD         Discharge Medications: Please see discharge summary for a list of discharge medications.  Relevant Imaging Results:  Relevant Lab Results:   Additional Information SS# [id number]  Su Hilt, RN

## 2021-06-07 LAB — CBC
HCT: 22.9 % — ABNORMAL LOW (ref 36.0–46.0)
Hemoglobin: 7.7 g/dL — ABNORMAL LOW (ref 12.0–15.0)
MCH: 31.8 pg (ref 26.0–34.0)
MCHC: 33.6 g/dL (ref 30.0–36.0)
MCV: 94.6 fL (ref 80.0–100.0)
Platelets: 179 10*3/uL (ref 150–400)
RBC: 2.42 MIL/uL — ABNORMAL LOW (ref 3.87–5.11)
RDW: 14.9 % (ref 11.5–15.5)
WBC: 8.1 10*3/uL (ref 4.0–10.5)
nRBC: 0 % (ref 0.0–0.2)

## 2021-06-07 LAB — BASIC METABOLIC PANEL
Anion gap: 5 (ref 5–15)
BUN: 29 mg/dL — ABNORMAL HIGH (ref 8–23)
CO2: 25 mmol/L (ref 22–32)
Calcium: 8.2 mg/dL — ABNORMAL LOW (ref 8.9–10.3)
Chloride: 106 mmol/L (ref 98–111)
Creatinine, Ser: 1.22 mg/dL — ABNORMAL HIGH (ref 0.44–1.00)
GFR, Estimated: 49 mL/min — ABNORMAL LOW (ref >60)
Glucose, Bld: 130 mg/dL — ABNORMAL HIGH (ref 70–99)
Potassium: 4.4 mmol/L (ref 3.5–5.1)
Sodium: 136 mmol/L (ref 135–145)

## 2021-06-07 NOTE — Plan of Care (Signed)
  Problem: Education: Goal: Knowledge of the prescribed therapeutic regimen will improve Outcome: Progressing   Problem: Activity: Goal: Ability to avoid complications of mobility impairment will improve Outcome: Progressing Goal: Range of joint motion will improve Outcome: Progressing   Problem: Clinical Measurements: Goal: Postoperative complications will be avoided or minimized Outcome: Progressing   Problem: Pain Management: Goal: Pain level will decrease with appropriate interventions Outcome: Progressing   Problem: Education: Goal: Knowledge of General Education information will improve Description: Including pain rating scale, medication(s)/side effects and non-pharmacologic comfort measures Outcome: Progressing   Problem: Clinical Measurements: Goal: Ability to maintain clinical measurements within normal limits will improve Outcome: Progressing Goal: Will remain free from infection Outcome: Progressing Goal: Respiratory complications will improve Outcome: Progressing   Problem: Coping: Goal: Level of anxiety will decrease Outcome: Progressing   Problem: Pain Managment: Goal: General experience of comfort will improve Outcome: Progressing

## 2021-06-07 NOTE — TOC Progression Note (Signed)
Received a call from HTA/THN with insurance auth approval, To go to [facility] number [id number], EMS [place], notified the patient

## 2021-06-07 NOTE — Progress Notes (Signed)
   Subjective: 2 Days Post-Op Procedure(s) (LRB): TOTAL KNEE ARTHROPLASTY (Left) Patient reports pain as 3 on 0-10 scale.   Patient is well, and has had no acute complaints or problems Denies any CP, SOB, ABD pain. We will continue therapy today.  Plan is to go Skilled nursing facility after hospital stay.  Objective: Vital signs in last 24 hours: Temp:  [97.7 F (36.5 C)-98.5 F (36.9 C)] 98.5 F (36.9 C) (07/28 0847) Pulse Rate:  [100-110] 100 (07/28 0847) Resp:  [15-18] 16 (07/28 0847) BP: (108-150)/(48-74) 136/74 (07/28 0847) SpO2:  [100 %] 100 % (07/28 0847)  Intake/Output from previous day: 07/27 0701 - 07/28 0700 In: 600 [P.O.:600] Out: 200 [Urine:200] Intake/Output this shift: No intake/output data recorded.  Recent Labs    06/05/21 1508 06/06/21 0548 06/07/21 0422  HGB 10.0* 8.6* 7.7*   Recent Labs    06/06/21 0548 06/07/21 0422  WBC 7.3 8.1  RBC 2.73* 2.42*  HCT 26.6* 22.9*  PLT 207 179   Recent Labs    06/06/21 0548 06/07/21 0422  NA 135 136  K 4.2 4.4  CL 100 106  CO2 26 25  BUN 34* 29*  CREATININE 1.75* 1.22*  GLUCOSE 168* 130*  CALCIUM 8.3* 8.2*   No results for input(s): LABPT, INR in the last 72 hours.  EXAM General - Patient is Alert, Appropriate, and Oriented Extremity - Neurovascular intact Sensation intact distally Intact pulses distally Dorsiflexion/Plantar flexion intact No cellulitis present Compartment soft Dressing - dressing C/D/I, 300 cc drainage and Praveena Motor Function - intact, moving foot and toes well on exam.   Past Medical History:  Diagnosis Date   Anemia    Arthritis    GERD (gastroesophageal reflux disease)    Hypertension    Lymphedema of both lower extremities    being followed by Dr Rhoderick Moody Boots bil   Venous ulcer of right leg ([place])    thigh-being followed by dr dew    Assessment/Plan:   2 Days Post-Op Procedure(s) (LRB): TOTAL KNEE ARTHROPLASTY (Left) Active Problems:   S/P TKR (total  knee replacement) using cement, left  Estimated body mass index is 48.59 kg/m as calculated from the following:   Height as of this encounter: '5\' 2"'$  (1.575 m).   Weight as of this encounter: 120.5 kg. Advance diet Up with therapy Pain well controlled Acute postop blood loss anemia-hemoglobin 7.7.  Continue iron supplement Recheck labs in the morning Vital signs stable Encourage incentive spirometer Work on bowel Youth worker to assist with discharge to skilled nursing facility.  DVT Prophylaxis - Lovenox, Foot Pumps, and TED hose Weight-Bearing as tolerated to left leg   T. Rachelle Hora, PA-C [place] 06/07/2021, 12:12 PM

## 2021-06-07 NOTE — Progress Notes (Signed)
Physical Therapy Treatment Patient Details Name: Autumn Burton MRN: [id number] DOB: [birth year] Today's Date: 06/07/2021    History of Present Illness Autumn Burton is a 1yoF who comes to [facility] on 4/13 for elective Left TKA. PMH: bilat knee OA, BLE lymphedema, HTN, LE cellulitis. PTA pt lived alone, uses RW for mobility, difficulty with community distances recently due to knee buckling and fall.    PT Comments    Author back for 2nd PT session. Pt again in recliner, ready for session. Still poor tolerance to ROM of knee today, pain with flexion not able to achive 60 degrees. Pain well controlled otherwise. Pt able to advance AMB twice, 10f each time, Left knee tolerating well, but limited by fatigue in BUE on RW. Her gait speed is remarkably slow and detracts from true utility of distance achieved as it is not practical to perform 7-8 minutes moderate physical activity to move from one side of a house to another, but she is showing excellent progression in her independence in gait. Will continue to follow.     Follow Up Recommendations  SNF;Follow surgeon's recommendation for DC plan and follow-up therapies;Supervision for mobility/OOB     Equipment Recommendations  None recommended by PT    Recommendations for Other Services       Precautions / Restrictions Precautions Precautions: Fall;Knee Precaution Booklet Issued: Yes (comment) Restrictions LLE Weight Bearing: Weight bearing as tolerated    Mobility  Bed Mobility     General bed mobility comments: in recliner at beginning of session.    Transfers Overall transfer level: Needs assistance Equipment used: Rolling walker (2 wheeled) (bariatric RW and standard RW) Transfers: Sit to/from Stand Sit to Stand: Supervision         General transfer comment: excellent carryover from prior day.  Ambulation/Gait Ambulation/Gait assistance: Min guard Gait Distance (Feet): 80 Feet (840fx2, seated rest between) Assistive  device: Standard walker (once with bari RW, once with bari SW) Gait Pattern/deviations: Step-to pattern Gait velocity: 0.0352m  General Gait Details: improved 3-point gait speed, but has recurrent fatigue of arms that requires stopping rest breaks.   Stairs             Wheelchair Mobility    Modified Rankin (Stroke Patients Only)       Balance Overall balance assessment: Mild deficits observed, not formally tested Sitting-balance support: No upper extremity supported;Feet supported;Feet unsupported Sitting balance-Leahy Scale: Normal     Standing balance support: No upper extremity supported;During functional activity Standing balance-Leahy Scale: Good               Cognition Arousal/Alertness: Awake/alert Behavior During Therapy: WFL for tasks assessed/performed Overall Cognitive Status: Within Functional Limits for tasks assessed                                        Exercises Total Joint Exercises Ankle Circles/Pumps: AROM;Both;15 reps Short Arc Quad: [place];15 reps;Seated Heel Slides: AROM;Left;Seated;Limitations Goniometric ROM: 10-54 derees Left knee flexion ROM    General Comments        Pertinent Vitals/Pain Pain Assessment: 0-10 Pain Score: 2  Pain Location: Left knee during ROM Pain Descriptors / Indicators: Operative site guarding;Sore Pain Intervention(s): Limited activity within patient's tolerance;Monitored during session;Premedicated before session    Home Living  Prior Function            PT Goals (current goals can now be found in the care plan section) Acute Rehab PT Goals Patient Stated Goal: get strong, feel good PT Goal Formulation: With patient Time For Goal Achievement: 06/19/21 Potential to Achieve Goals: Good Progress towards PT goals: Progressing toward goals    Frequency    BID      PT Plan Current plan remains appropriate    Co-evaluation               AM-PAC PT "6 Clicks" Mobility   Outcome Measure  Help needed turning from your back to your side while in a flat bed without using bedrails?: A Little Help needed moving from lying on your back to sitting on the side of a flat bed without using bedrails?: A Lot Help needed moving to and from a bed to a chair (including a wheelchair)?: A Little Help needed standing up from a chair using your arms (e.g., wheelchair or bedside chair)?: A Little Help needed to walk in hospital room?: A Little Help needed climbing 3-5 steps with a railing? : Total 6 Click Score: 15    End of Session Equipment Utilized During Treatment: Gait belt Activity Tolerance: Patient tolerated treatment well;No increased pain Patient left: with call bell/phone within reach Nurse Communication: Mobility status PT Visit Diagnosis: Unsteadiness on feet (R26.81);Difficulty in walking, not elsewhere classified (R26.2);Other abnormalities of gait and mobility (R26.89);History of falling (Z91.81);Muscle weakness (generalized) (M62.81);Other symptoms and signs involving the nervous system (R29.898)     Time: [id number] PT Time Calculation (min) (ACUTE ONLY): 34 min  Charges:  $Gait Training: 23-37 mins                    4:31 PM, 06/07/21 [name], PT, DPT Physical Therapist - [facility]  [phone] ([place])    [place] C 06/07/2021, 4:31 PM

## 2021-06-07 NOTE — Progress Notes (Signed)
Occupational Therapy Treatment Patient Details Name: Autumn Burton MRN: [id number] DOB: [birth year] Today's Date: 06/07/2021    History of present illness Autumn Burton is a 47yoF who comes to [facility] on 4/13 for elective Left TKA. PMH: bilat knee OA, BLE lymphedema, HTN, LE cellulitis. PTA pt lived alone, uses RW for mobility, difficulty with community distances recently due to knee buckling and fall.   OT comments  Pt seen for OT tx this date to f/u re: safety with ADLs. Pt reporting she just got back into bed, but is agreeable to ADL education including modification and adaptation techniques to implement after knee surgery. OT provides handout and educates pt re:  polar care mgt, compression stocking mgt, LB ADLs wtih AE including demonstration and teach back discussion with patient in which she was able to describe correct sequence and rationale for use. Pt demos still requiring MOD A for LB ADLs at this time. Will continue to progress with use of AE PRN to improve ADL INDEP. Pt left with all needs met and in reach. Will continue to follow.   Follow Up Recommendations  SNF    Equipment Recommendations  3 in 1 bedside commode;Tub/shower seat;Other (comment)    Recommendations for Other Services      Precautions / Restrictions Precautions Precautions: Fall;Knee Precaution Booklet Issued: Yes (comment) Restrictions Weight Bearing Restrictions: Yes LLE Weight Bearing: Weight bearing as tolerated       Mobility Bed Mobility   Bed Mobility: Supine to Sit     Supine to sit: Min assist;HOB elevated     General bed mobility comments: deferred    Transfers Overall transfer level: Needs assistance Equipment used: Rolling walker (2 wheeled) (bariatric RW and standard RW) Transfers: Sit to/from Stand Sit to Stand: Supervision         General transfer comment: excellent carryover from prior day.    Balance Overall balance assessment: Mild deficits observed, not formally  tested Sitting-balance support: No upper extremity supported;Feet supported;Feet unsupported Sitting balance-Leahy Scale: Normal     Standing balance support: No upper extremity supported;During functional activity Standing balance-Leahy Scale: Good                             ADL either performed or assessed with clinical judgement   ADL Overall ADL's : Needs assistance/impaired                     Lower Body Dressing: Moderate assistance;Maximal assistance;Sitting/lateral leans Lower Body Dressing Details (indicate cue type and reason): OT ed re: AE for LB ADLs as pt still presenting with decreased ROM in post op LE to perform LB dressing                     Vision Patient Visual Report: No change from baseline     Perception     Praxis      Cognition Arousal/Alertness: Awake/alert Behavior During Therapy: WFL for tasks assessed/performed Overall Cognitive Status: Within Functional Limits for tasks assessed                                          Exercises Total Joint Exercises Ankle Circles/Pumps: AROM;Both;15 reps Short Arc QuadSinclair Ship;Left;15 reps;Seated Heel Slides: AROM;Left;Seated;Limitations Goniometric ROM: 10-54 derees Left knee flexion ROM Other Exercises Other Exercises: OT re-ed regarding: polar care  mgt, compression stocking mgt, LB ADLs wtih AE including demonstration and teach back discussion with patient in which she was able to describe correct sequence and rationale for use.   Shoulder Instructions       General Comments      Pertinent Vitals/ Pain       Pain Assessment: 0-10 Pain Score: 2  Pain Location: L knee Pain Descriptors / Indicators: Sore Pain Intervention(s): Monitored during session  Home Living                                          Prior Functioning/Environment              Frequency  Min 2X/week        Progress Toward Goals  OT Goals(current goals  can now be found in the care plan section)  Progress towards OT goals: Progressing toward goals  Acute Rehab OT Goals Patient Stated Goal: get strong, feel good OT Goal Formulation: With patient Time For Goal Achievement: 06/20/21 Potential to Achieve Goals: Good  Plan Discharge plan remains appropriate    Co-evaluation                 AM-PAC OT "6 Clicks" Daily Activity     Outcome Measure   Help from another person eating meals?: None Help from another person taking care of personal grooming?: A Little Help from another person toileting, which includes using toliet, bedpan, or urinal?: A Little Help from another person bathing (including washing, rinsing, drying)?: A Little Help from another person to put on and taking off regular upper body clothing?: None Help from another person to put on and taking off regular lower body clothing?: A Lot 6 Click Score: 19    End of Session    OT Visit Diagnosis: Unsteadiness on feet (R26.81);Muscle weakness (generalized) (M62.81)   Activity Tolerance Patient tolerated treatment well   Patient Left with call bell/phone within reach;in bed;with bed alarm set   Nurse Communication Mobility status;Patient requests pain meds        Time: 1510-1533 OT Time Calculation (min): 23 min  Charges: OT General Charges $OT Visit: 1 Visit OT Treatments $Self Care/Home Management : 23-37 mins  Gerrianne Scale, [place], OTR/L ascom [phone] 06/07/21, 6:34 PM

## 2021-06-07 NOTE — Progress Notes (Signed)
Physical Therapy Treatment Patient Details Name: Autumn Burton MRN: [id number] DOB: Aug 16, 1956 Today's Date: 06/07/2021    History of Present Illness Autumn Burton is a 48yoF who comes to [facility] on 4/13 for elective Left TKA. PMH: bilat knee OA, BLE lymphedema, HTN, LE cellulitis. PTA pt lived alone, uses RW for mobility, difficulty with community distances recently due to knee buckling and fall.    PT Comments    Pt in bed upon entry agreeable to session. MinA to EOB, STS from elevated surface at supervision level, AMB to toilet for urgent BM showing improved confidence and speed. Pt left up on commode at EOS, RN aware. Pt much more limited in ROM tolerance this date, continues to have poor quads activation. Will continue to follow.    Follow Up Recommendations  SNF;Follow surgeon's recommendation for DC plan and follow-up therapies;Supervision for mobility/OOB     Equipment Recommendations  None recommended by PT    Recommendations for Other Services       Precautions / Restrictions Precautions Precautions: Fall;Knee Precaution Booklet Issued: Yes (comment) Restrictions LLE Weight Bearing: Weight bearing as tolerated    Mobility  Bed Mobility   Bed Mobility: Supine to Sit     Supine to sit: Min assist;HOB elevated          Transfers Overall transfer level: Needs assistance Equipment used: Rolling walker (2 wheeled) Transfers: Sit to/from Stand Sit to Stand: Supervision         General transfer comment: excellent carryover from prior day  Ambulation/Gait Ambulation/Gait assistance: Min guard Gait Distance (Feet): 20 Feet Assistive device: Standard walker (bari standard walker for width) Gait Pattern/deviations: Step-to pattern     General Gait Details: labored, 3-point gait   Stairs             Wheelchair Mobility    Modified Rankin (Stroke Patients Only)       Balance Overall balance assessment: Mild deficits observed, not formally  tested Sitting-balance support: No upper extremity supported;Feet supported;Feet unsupported Sitting balance-Leahy Scale: Normal     Standing balance support: No upper extremity supported;During functional activity Standing balance-Leahy Scale: Good                              Cognition Arousal/Alertness: Awake/alert Behavior During Therapy: WFL for tasks assessed/performed Overall Cognitive Status: Within Functional Limits for tasks assessed                                        Exercises Total Joint Exercises Ankle Circles/Pumps: AROM;Both;15 reps Short Arc QuadSinclair Ship;Left;15 reps;Seated Heel Slides: AROM;Left;Seated;Limitations Goniometric ROM: 10-54 derees Left knee flexion ROM    General Comments        Pertinent Vitals/Pain Pain Assessment: 0-10 Pain Score: 2  Pain Location: Left knee during ROM Pain Descriptors / Indicators: Operative site guarding;Sore Pain Intervention(s): Limited activity within patient's tolerance;Monitored during session;Premedicated before session    Home Living                      Prior Function            PT Goals (current goals can now be found in the care plan section) Acute Rehab PT Goals Patient Stated Goal: get strong, feel good PT Goal Formulation: With patient Time For Goal Achievement: 06/19/21 Potential to Achieve Goals: Good  Progress towards PT goals: Progressing toward goals    Frequency    BID      PT Plan Current plan remains appropriate    Co-evaluation              AM-PAC PT "6 Clicks" Mobility   Outcome Measure  Help needed turning from your back to your side while in a flat bed without using bedrails?: A Little Help needed moving from lying on your back to sitting on the side of a flat bed without using bedrails?: A Lot Help needed moving to and from a bed to a chair (including a wheelchair)?: A Little Help needed standing up from a chair using your arms  (e.g., wheelchair or bedside chair)?: A Little Help needed to walk in hospital room?: A Little Help needed climbing 3-5 steps with a railing? : Total 6 Click Score: 15    End of Session Equipment Utilized During Treatment: Gait belt Activity Tolerance: Patient tolerated treatment well;No increased pain Patient left: with call bell/phone within reach (toilet in BR) Nurse Communication: Mobility status PT Visit Diagnosis: Unsteadiness on feet (R26.81);Difficulty in walking, not elsewhere classified (R26.2);Other abnormalities of gait and mobility (R26.89);History of falling (Z91.81);Muscle weakness (generalized) (M62.81);Other symptoms and signs involving the nervous system (R29.898)     Time: [id number] PT Time Calculation (min) (ACUTE ONLY): 20 min  Charges:  $Therapeutic Exercise: 8-22 mins                    4:24 PM, 06/07/21 [name], PT, DPT Physical Therapist - [facility]  [phone] ([place])     Yaurel C 06/07/2021, 4:20 PM

## 2021-06-07 NOTE — TOC Progression Note (Signed)
Transition of Care [facility]) - Progression Note    Patient Details  Name: Autumn Burton MRN: [id number] Date of Birth: 02-Apr-1956  Transition of Care [facility]) CM/SW [place], RN Phone Number: 06/07/2021, 9:13 AM  Clinical Narrative:    Seth Bake with [facility] offered the bed and will be available Friday, the patient accepted the bed offer and I called [employer] to get insurance approval, the patient is aware, I requested insurance approval for EMS first choice to transport as well        Expected Discharge Plan and Services                                                 Social Determinants of Health (SDOH) Interventions    Readmission Risk Interventions No flowsheet data found.

## 2021-06-08 DIAGNOSIS — Z96652 Presence of left artificial knee joint: Secondary | ICD-10-CM | POA: Diagnosis not present

## 2021-06-08 DIAGNOSIS — Z471 Aftercare following joint replacement surgery: Secondary | ICD-10-CM | POA: Diagnosis not present

## 2021-06-08 DIAGNOSIS — E1142 Type 2 diabetes mellitus with diabetic polyneuropathy: Secondary | ICD-10-CM | POA: Diagnosis not present

## 2021-06-08 DIAGNOSIS — R2689 Other abnormalities of gait and mobility: Secondary | ICD-10-CM | POA: Diagnosis not present

## 2021-06-08 DIAGNOSIS — M170 Bilateral primary osteoarthritis of knee: Secondary | ICD-10-CM | POA: Diagnosis not present

## 2021-06-08 DIAGNOSIS — M1991 Primary osteoarthritis, unspecified site: Secondary | ICD-10-CM | POA: Diagnosis not present

## 2021-06-08 DIAGNOSIS — N189 Chronic kidney disease, unspecified: Secondary | ICD-10-CM | POA: Diagnosis not present

## 2021-06-08 DIAGNOSIS — M1712 Unilateral primary osteoarthritis, left knee: Secondary | ICD-10-CM | POA: Diagnosis not present

## 2021-06-08 DIAGNOSIS — R278 Other lack of coordination: Secondary | ICD-10-CM | POA: Diagnosis not present

## 2021-06-08 DIAGNOSIS — M25512 Pain in left shoulder: Secondary | ICD-10-CM | POA: Diagnosis not present

## 2021-06-08 DIAGNOSIS — N1831 Chronic kidney disease, stage 3a: Secondary | ICD-10-CM | POA: Diagnosis not present

## 2021-06-08 DIAGNOSIS — R609 Edema, unspecified: Secondary | ICD-10-CM | POA: Diagnosis not present

## 2021-06-08 DIAGNOSIS — M6281 Muscle weakness (generalized): Secondary | ICD-10-CM | POA: Diagnosis not present

## 2021-06-08 DIAGNOSIS — I890 Lymphedema, not elsewhere classified: Secondary | ICD-10-CM | POA: Diagnosis not present

## 2021-06-08 DIAGNOSIS — Z743 Need for continuous supervision: Secondary | ICD-10-CM | POA: Diagnosis not present

## 2021-06-08 DIAGNOSIS — K219 Gastro-esophageal reflux disease without esophagitis: Secondary | ICD-10-CM | POA: Diagnosis not present

## 2021-06-08 DIAGNOSIS — G629 Polyneuropathy, unspecified: Secondary | ICD-10-CM | POA: Diagnosis not present

## 2021-06-08 DIAGNOSIS — I10 Essential (primary) hypertension: Secondary | ICD-10-CM | POA: Diagnosis not present

## 2021-06-08 DIAGNOSIS — E1159 Type 2 diabetes mellitus with other circulatory complications: Secondary | ICD-10-CM | POA: Diagnosis not present

## 2021-06-08 DIAGNOSIS — D649 Anemia, unspecified: Secondary | ICD-10-CM | POA: Diagnosis not present

## 2021-06-08 DIAGNOSIS — E8770 Fluid overload, unspecified: Secondary | ICD-10-CM | POA: Diagnosis not present

## 2021-06-08 DIAGNOSIS — M069 Rheumatoid arthritis, unspecified: Secondary | ICD-10-CM | POA: Diagnosis not present

## 2021-06-08 DIAGNOSIS — I959 Hypotension, unspecified: Secondary | ICD-10-CM | POA: Diagnosis not present

## 2021-06-08 LAB — HEMOGLOBIN AND HEMATOCRIT, BLOOD
HCT: 27.8 % — ABNORMAL LOW (ref 36.0–46.0)
Hemoglobin: 9.1 g/dL — ABNORMAL LOW (ref 12.0–15.0)

## 2021-06-08 LAB — SARS CORONAVIRUS 2 (TAT 6-24 HRS): SARS Coronavirus 2: NEGATIVE

## 2021-06-08 LAB — CBC
HCT: 21.4 % — ABNORMAL LOW (ref 36.0–46.0)
Hemoglobin: 7.1 g/dL — ABNORMAL LOW (ref 12.0–15.0)
MCH: 32.4 pg (ref 26.0–34.0)
MCHC: 33.2 g/dL (ref 30.0–36.0)
MCV: 97.7 fL (ref 80.0–100.0)
Platelets: 203 10*3/uL (ref 150–400)
RBC: 2.19 MIL/uL — ABNORMAL LOW (ref 3.87–5.11)
RDW: 14.9 % (ref 11.5–15.5)
WBC: 8.3 10*3/uL (ref 4.0–10.5)
nRBC: 0 % (ref 0.0–0.2)

## 2021-06-08 LAB — PREPARE RBC (CROSSMATCH)

## 2021-06-08 MED ORDER — TRAMADOL HCL 50 MG PO TABS: 50.0000 mg | ORAL_TABLET | Freq: Four times a day (QID) | ORAL | 0 refills | Status: DC | PRN

## 2021-06-08 MED ORDER — FE FUMARATE-B12-VIT C-FA-IFC PO CAPS: 1.0000 | ORAL_CAPSULE | Freq: Two times a day (BID) | ORAL | Status: DC

## 2021-06-08 MED ORDER — SODIUM CHLORIDE 0.9% IV SOLUTION: Freq: Once | INTRAVENOUS | Status: AC

## 2021-06-08 MED ORDER — DOCUSATE SODIUM 100 MG PO CAPS: 100.0000 mg | ORAL_CAPSULE | Freq: Two times a day (BID) | ORAL | 0 refills | Status: DC

## 2021-06-08 MED ORDER — ENOXAPARIN SODIUM 40 MG/0.4ML IJ SOSY: 40.0000 mg | PREFILLED_SYRINGE | INTRAMUSCULAR | 0 refills | Status: DC

## 2021-06-08 MED ORDER — HYDROCODONE-ACETAMINOPHEN 5-325 MG PO TABS: 1.0000 | ORAL_TABLET | ORAL | 0 refills | Status: DC | PRN

## 2021-06-08 MED ORDER — METHOCARBAMOL 500 MG PO TABS: 500.0000 mg | ORAL_TABLET | Freq: Four times a day (QID) | ORAL | 0 refills | Status: DC | PRN

## 2021-06-08 NOTE — Discharge Summary (Signed)
Physician Discharge Summary  Patient ID: Autumn Burton MRN: [id number] DOB/AGE: [birth year] 65 y.o.  Admit date: 06/05/2021 Discharge date: 06/08/2021  Admission Diagnoses:  S/P TKR (total knee replacement) using cement, left [Z96.652]   Discharge Diagnoses: Patient Active Problem List   Diagnosis Date Noted   S/P TKR (total knee replacement) using cement, left 06/05/2021   Ulcer of right leg, limited to breakdown of skin ([place]) 05/11/2021   Chronic pain of both knees 11/15/2020   History of abnormal mammogram 04/06/2020   Retaining fluid 04/06/2020   Decreased hemoglobin 04/06/2020   AKI (acute kidney injury) ([place]) 04/06/2020   History of sepsis 03/01/2020   Hypokalemia 03/01/2020   History of rhabdomyolysis 03/01/2020   Rheumatoid arthritis ([place]) 03/01/2020   Peripheral neuropathy 03/01/2020   Hx of colonic polyps    Polyp of sigmoid colon    Morbidly obese ([place]) 03/06/2016   Chronic eczema 01/17/2015   Cough due to ACE inhibitor 01/17/2015   Essential (primary) hypertension 01/17/2015   Secondary osteoarthritis of right ankle 01/17/2015   Acquired lymphedema of leg 11/25/2014   Infection and inflammatory reaction due to internal orthopedic device, implant, and graft ([place]) 05/01/2010   Pseudoarthrosis of cervical spine ([place]) 01/12/2010   Hyperthyroidism, subclinical 01/12/2010   Tachycardia 01/12/2010   Nonunion of fracture 01/12/2010    Past Medical History:  Diagnosis Date   Anemia    Arthritis    GERD (gastroesophageal reflux disease)    Hypertension    Lymphedema of both lower extremities    being followed by Dr Rhoderick Moody Boots bil   Venous ulcer of right leg ([place])    thigh-being followed by dr dew     Transfusion: 1 unit of PRBC on 06/08/21   Consultants (if any):   Discharged Condition: Improved  Hospital Course: Autumn Burton is an 65 y.o. female who was admitted 06/05/2021 with a diagnosis of knee osteoarthritis and went to the operating room on  06/05/2021 and underwent the above named procedures.    Surgeries: Procedure(s): TOTAL KNEE ARTHROPLASTY on 06/05/2021 Patient tolerated the surgery well. Taken to PACU where she was stabilized and then transferred to the orthopedic floor.  Started on Lovenox 30 mg q 12 hrs. Foot pumps applied bilaterally at 80 mm. Heels elevated on bed with rolled towels. No evidence of DVT. Negative Homan. Physical therapy started on day #1 for gait training and transfer. OT started day #1 for ADL and assisted devices.  Patient's foley was d/c on day #1. Patient's IV  was d/c on day #2.  On post op day #3, Hgb down to 7.1, given 1 unit of PRBC. VSS. Patient was stable and ready for discharge to SNF.  Please remove provena negative pressure dressing on 06/15/2021 and apply honey comb dressing. Keep dressing clean and dry at all times.  She was given perioperative antibiotics:  Anti-infectives (From admission, onward)    Start     Dose/Rate Route Frequency Ordered Stop   06/05/21 1700  ceFAZolin (ANCEF) IVPB 2g/100 mL premix        2 g 200 mL/hr over 30 Minutes Intravenous Every 6 hours 06/05/21 1430 06/05/21 2133   06/05/21 0828  ceFAZolin (ANCEF) 2-4 GM/100ML-% IVPB       Note to Pharmacy: Doreen Salvage   : cabinet override      06/05/21 0828 06/05/21 1635   06/05/21 0800  ceFAZolin (ANCEF) IVPB 2g/100 mL premix        2 g 200 mL/hr over 30  Minutes Intravenous On call to O.R. 06/05/21 0753 06/05/21 1043     .  She was given sequential compression devices, early ambulation, and Lovenox TEDs for DVT prophylaxis.  She benefited maximally from the hospital stay and there were no complications.    Recent vital signs:  Vitals:   06/08/21 0507 06/08/21 0842  BP: (!) 118/54 111/80  Pulse: (!) 117 98  Resp: 15 18  Temp: 98.1 F (36.7 C) 98.7 F (37.1 C)  SpO2: 99% 100%    Recent laboratory studies:  Lab Results  Component Value Date   HGB 7.1 (L) 06/08/2021   HGB 7.7 (L) 06/07/2021   HGB 8.6  (L) 06/06/2021   Lab Results  Component Value Date   WBC 8.3 06/08/2021   PLT 203 06/08/2021   No results found for: INR Lab Results  Component Value Date   NA 136 06/07/2021   K 4.4 06/07/2021   CL 106 06/07/2021   CO2 25 06/07/2021   BUN 29 (H) 06/07/2021   CREATININE 1.22 (H) 06/07/2021   GLUCOSE 130 (H) 06/07/2021    Discharge Medications:   Allergies as of 06/08/2021       Reactions   Latex Hives, Itching, Rash   Redness        Medication List     STOP taking these medications    aspirin EC 81 MG tablet   indomethacin 50 MG capsule Commonly known as: INDOCIN   mupirocin cream 2 % Commonly known as: BACTROBAN       TAKE these medications    acetaminophen 500 MG tablet Commonly known as: TYLENOL Take 500 mg by mouth every 6 (six) hours as needed for moderate pain.   calcium carbonate 500 MG chewable tablet Commonly known as: TUMS - dosed in mg elemental calcium Chew 1 tablet by mouth daily as needed for indigestion or heartburn.   docusate sodium 100 MG capsule Commonly known as: COLACE Take 1 capsule (100 mg total) by mouth 2 (two) times daily.   enoxaparin 40 MG/0.4ML injection Commonly known as: LOVENOX Inject 0.4 mLs (40 mg total) into the skin daily for 14 days.   ferrous [id number] C-folic acid capsule Commonly known as: TRINSICON / FOLTRIN Take 1 capsule by mouth 2 (two) times daily.   folic acid 1 MG tablet Commonly known as: FOLVITE Take 1 mg by mouth See admin instructions. Take 1 mg by mouth daily except for Sunday   furosemide 20 MG tablet Commonly known as: LASIX Take 1 tablet (20 mg total) by mouth daily.   gabapentin 300 MG capsule Commonly known as: NEURONTIN Take 300 mg by mouth 3 (three) times daily.   halobetasol 0.05 % ointment Commonly known as: ULTRAVATE Apply 1 application topically 2 (two) times daily as needed (irritation).   hydrochlorothiazide 25 MG tablet Commonly known as: HYDRODIURIL Take 1  tablet (25 mg total) by mouth daily.   HYDROcodone-acetaminophen 5-325 MG tablet Commonly known as: NORCO/VICODIN Take 1-2 tablets by mouth every 4 (four) hours as needed for moderate pain (pain score 4-6).   methocarbamol 500 MG tablet Commonly known as: ROBAXIN Take 1 tablet (500 mg total) by mouth every 6 (six) hours as needed for muscle spasms. What changed:  when to take this reasons to take this   methotrexate 2.5 MG tablet Take 15 mg by mouth every Sunday.   traMADol 50 MG tablet Commonly known as: ULTRAM Take 1 tablet (50 mg total) by mouth every 6 (six) hours as needed. What  changed:  when to take this reasons to take this   triamcinolone ointment 0.1 % Commonly known as: KENALOG Apply 1 application topically daily as needed (eczema flares). What changed: Another medication with the same name was removed. Continue taking this medication, and follow the directions you see here.       ASK your doctor about these medications    metoprolol tartrate 25 MG tablet Commonly known as: LOPRESSOR Take 1 tablet (25 mg total) by mouth daily.   olmesartan 20 MG tablet Commonly known as: BENICAR Take 1 tablet (20 mg total) by mouth daily.   potassium chloride 10 MEQ tablet Commonly known as: KLOR-CON TAKE 1 TABLET BY MOUTH ONCE DAILY. TAKE WITH FUROSEMIDE.               Durable Medical Equipment  (From admission, onward)           Start     Ordered   06/05/21 1431  DME Walker rolling  Once       Comments: Heavy-duty  Question Answer Comment  Walker: With [place]   Patient needs a walker to treat with the following condition S/P TKR (total knee replacement) using cement, left      06/05/21 1430   06/05/21 1431  DME 3 n 1  Once        06/05/21 1430   06/05/21 1431  DME Bedside commode  Once       Comments: Heavy-duty  Question:  Patient needs a bedside commode to treat with the following condition  Answer:  S/P TKR (total knee replacement) using  cement, left   06/05/21 1430            Diagnostic Studies: DG Knee 1-2 Views Left  Result Date: 06/05/2021 CLINICAL DATA:  Status post knee replacement EXAM: LEFT KNEE - 1-2 VIEW COMPARISON:  03/26/2016 FINDINGS: Left total knee prosthesis is well seated without periprosthetic fracture or lucency. Skin staples and subcutaneous emphysema consistent with immediate postop status. Deformity of the proximal fibula consistent with prior healed fracture. IMPRESSION: Uncomplicated left total knee prosthesis with immediate postop changes. Electronically Signed   By: Miachel Roux M.D.   On: 06/05/2021 13:01    Disposition:      Contact information for follow-up providers     Duanne Guess, PA-C Follow up in 2 week(s).   Specialties: Orthopedic Surgery, Emergency Medicine Contact information: [place] [address] [phone]              Contact information for after-discharge care     Destination     [facility] .   Service: Skilled Nursing Contact information: [place] [phone]                      Signed: Feliberto Gottron 06/08/2021, 11:51 AM

## 2021-06-08 NOTE — Care Management Important Message (Signed)
Important Message  Patient Details  Name: Autumn Burton MRN: [id number] Date of Birth: October 25, 1956   Medicare Important Message Given:  N/A - LOS <3 / Initial given by admissions     Juliann Pulse A Quy Lotts 06/08/2021, 8:39 AM

## 2021-06-08 NOTE — Progress Notes (Signed)
   06/08/21 0016  Assess: MEWS Score  Temp 98.1 F (36.7 C)  BP (!) 112/51  Pulse Rate (!) 123  Resp 17  SpO2 98 %  O2 Device Room Air  Assess: MEWS Score  MEWS Temp 0  MEWS Systolic 0  MEWS Pulse 2  MEWS RR 0  MEWS LOC 0  MEWS Score 2  MEWS Score Color Yellow  Assess: if the MEWS score is Yellow or Red  Were vital signs taken at a resting state? Yes  Focused Assessment No change from prior assessment  Does the patient meet 2 or more of the SIRS criteria? No  MEWS guidelines implemented *See Row Information* No, previously yellow, continue vital signs every 4 hours  Treat  Pain Scale 0-10  Pain Score 0  Pain Type Surgical pain  Pain Location Knee  Pain Orientation Left  Pain Descriptors / Indicators Constant  Pain Frequency Constant  Pain Onset Gradual  Patients Stated Pain Goal 3  Take Vital Signs  Increase Vital Sign Frequency  Yellow: Q 2hr X 2 then Q 4hr X 2, if remains yellow, continue Q 4hrs  Escalate  MEWS: Escalate Yellow: discuss with charge nurse/RN and consider discussing with provider and RRT  Notify: Charge Nurse/RN  Name of Charge Nurse/RN Notified Multimedia programmer  Date Charge Nurse/RN Notified 06/08/21  Time Charge Nurse/RN Notified 0105  Notify: Provider  Provider Name/Title Posey Pronto. S MD  Date Provider Notified 06/08/21  Time Provider Notified 0106  Notification Type Call  Notification Reason Change in status  Provider response No new orders  Date of Provider Response 06/08/21  Time of Provider Response 0106  Document  Progress note created (see row info) Yes  Assess: SIRS CRITERIA  SIRS Temperature  0  SIRS Pulse 1  SIRS Respirations  0  SIRS WBC 0  SIRS Score Sum  1   Patient in bed resting quietly. No c/o of pain or discomfort at this time. Will continue to monitor. No new orders received from on call dr Posey Pronto. S for MEWS of 2.

## 2021-06-08 NOTE — Progress Notes (Signed)
Physical Therapy Treatment Patient Details Name: Autumn Burton MRN: [id number] DOB: [birth year] Today's Date: 06/08/2021    History of Present Illness Autumn Burton is a 43yoF who comes to [facility] on 4/13 for elective Left TKA. PMH: bilat knee OA, BLE lymphedema, HTN, LE cellulitis. PTA pt lived alone, uses RW for mobility, difficulty with community distances recently due to knee buckling and fall.    PT Comments    Pt in bed upon entry, agreeable to session, but voices toiletting needs. Pt assisted with bed level exercise to achieve improved tolerance and mobility of ROM in knee. Weakness of quads remains limiting with HEP. MinA to EOB. Able to stand without assist from elevated surface, but lacks sufficient motor control at toilet regardless of technique- riser left in place at end of session. Pt AMB fairly well still, no gross symptoms of anemia, however, distance is limited due to continued Hb downtrend, pt now due to receive blood. Pt progressing well toward goals overall, still very limited in ability to perform her HEP without physical assist. Pt also reporting new skin injury on Rt lower leg due to TED hose digging into a skin crease. Pt left up in recliner at end of session, likely DC today.    Follow Up Recommendations  SNF;Follow surgeon's recommendation for DC plan and follow-up therapies;Supervision for mobility/OOB     Equipment Recommendations  None recommended by PT    Recommendations for Other Services       Precautions / Restrictions Precautions Precautions: Fall;Knee Precaution Booklet Issued: Yes (comment) Restrictions LLE Weight Bearing: Weight bearing as tolerated    Mobility  Bed Mobility Overal bed mobility: Needs Assistance Bed Mobility: Supine to Sit     Supine to sit: Min assist     General bed mobility comments: Still difficult movin g operativ eleg out of bed    Transfers Overall transfer level: Needs assistance Equipment used: Rolling walker  (2 wheeled) (bari RW for width) Transfers: Sit to/from Stand Sit to Stand: Supervision;Min guard;From elevated surface         General transfer comment: strugglers with safe transition to standard toilet height, really needs a riser with handles)  Ambulation/Gait   Gait Distance (Feet): 70 Feet (limited du eto VAC battery warning and Hb drop now pending blood) Assistive device:  (BRW) Gait Pattern/deviations: Step-to pattern         Stairs             Wheelchair Mobility    Modified Rankin (Stroke Patients Only)       Balance                                            Cognition Arousal/Alertness: Awake/alert Behavior During Therapy: WFL for tasks assessed/performed Overall Cognitive Status: Within Functional Limits for tasks assessed                                        Exercises General Exercises - Lower Extremity Short Arc QuadSinclair Ship;Left;15 reps;Supine;Limitations Short Arc Quad Limitations: remains very weak, Chief Strategy Officer provides modA to achieve this; ROM somewhat better tolerated that previous day    General Comments        Pertinent Vitals/Pain Pain Assessment: 0-10 Pain Score: 3  Pain Location: L knee Pain Intervention(s): Limited activity  within patient's tolerance;Monitored during session;Repositioned    Home Living                      Prior Function            PT Goals (current goals can now be found in the care plan section) Acute Rehab PT Goals Patient Stated Goal: get strong, feel good PT Goal Formulation: With patient Time For Goal Achievement: 06/19/21 Potential to Achieve Goals: Good Progress towards PT goals: Progressing toward goals    Frequency    BID      PT Plan Current plan remains appropriate    Co-evaluation              AM-PAC PT "6 Clicks" Mobility   Outcome Measure  Help needed turning from your back to your side while in a flat bed without using  bedrails?: A Little Help needed moving from lying on your back to sitting on the side of a flat bed without using bedrails?: A Lot Help needed moving to and from a bed to a chair (including a wheelchair)?: A Little Help needed standing up from a chair using your arms (e.g., wheelchair or bedside chair)?: A Little Help needed to walk in hospital room?: A Little Help needed climbing 3-5 steps with a railing? : Total 6 Click Score: 15    End of Session   Activity Tolerance: Patient tolerated treatment well;No increased pain Patient left: with call bell/phone within reach;in chair;with SCD's reapplied Nurse Communication: Mobility status PT Visit Diagnosis: Unsteadiness on feet (R26.81);Difficulty in walking, not elsewhere classified (R26.2);Other abnormalities of gait and mobility (R26.89);History of falling (Z91.81);Muscle weakness (generalized) (M62.81);Other symptoms and signs involving the nervous system (R29.898)     Time: [id number] PT Time Calculation (min) (ACUTE ONLY): 45 min  Charges:  $Gait Training: 8-22 mins $Therapeutic Exercise: 23-37 mins                    12:29 PM, 06/08/21 [name], PT, DPT Physical Therapist - [facility]  [phone] ([place])     [place] C 06/08/2021, 12:23 PM

## 2021-06-08 NOTE — Progress Notes (Signed)
   Subjective: 3 Days Post-Op Procedure(s) (LRB): TOTAL KNEE ARTHROPLASTY (Left) Patient reports pain as 3 on 0-10 scale.   Patient is well, and has had no acute complaints or problems Denies any CP, SOB, ABD pain.  Patient noted to have elevated heart rate. We will continue therapy today.  Plan is to go Skilled nursing facility after hospital stay.  Objective: Vital signs in last 24 hours: Temp:  [98 F (36.7 C)-98.5 F (36.9 C)] 98.1 F (36.7 C) (07/29 0507) Pulse Rate:  [98-123] 117 (07/29 0507) Resp:  [15-18] 15 (07/29 0507) BP: (112-136)/(41-74) 118/54 (07/29 0507) SpO2:  [98 %-100 %] 99 % (07/29 0507)  Intake/Output from previous day: 07/28 0701 - 07/29 0700 In: 240 [P.O.:240] Out: -  Intake/Output this shift: No intake/output data recorded.  Recent Labs    06/05/21 1508 06/06/21 0548 06/07/21 0422 06/08/21 0507  HGB 10.0* 8.6* 7.7* 7.1*   Recent Labs    06/07/21 0422 06/08/21 0507  WBC 8.1 8.3  RBC 2.42* 2.19*  HCT 22.9* 21.4*  PLT 179 203   Recent Labs    06/06/21 0548 06/07/21 0422  NA 135 136  K 4.2 4.4  CL 100 106  CO2 26 25  BUN 34* 29*  CREATININE 1.75* 1.22*  GLUCOSE 168* 130*  CALCIUM 8.3* 8.2*   No results for input(s): LABPT, INR in the last 72 hours.  EXAM General - Patient is Alert, Appropriate, and Oriented Extremity - Neurovascular intact Sensation intact distally Intact pulses distally Dorsiflexion/Plantar flexion intact No cellulitis present Compartment soft Dressing - dressing C/D/I, 350 cc drainage and Praveena Motor Function - intact, moving foot and toes well on exam.   Past Medical History:  Diagnosis Date   Anemia    Arthritis    GERD (gastroesophageal reflux disease)    Hypertension    Lymphedema of both lower extremities    being followed by Dr Rhoderick Moody Boots bil   Venous ulcer of right leg ([place])    thigh-being followed by dr dew    Assessment/Plan:   3 Days Post-Op Procedure(s) (LRB): TOTAL KNEE  ARTHROPLASTY (Left) Active Problems:   S/P TKR (total knee replacement) using cement, left  Estimated body mass index is 48.59 kg/m as calculated from the following:   Height as of this encounter: '5\' 2"'$  (1.575 m).   Weight as of this encounter: 120.5 kg. Advance diet Up with therapy Pain well controlled Acute postop blood loss anemia-hemoglobin 7.1  Continue iron supplement.  Will transfuse 1 unit of packed red blood cells today, recheck hemoglobin posttransfusion. Recheck labs in the morning Tachycardia -heart rate regular rhythm.  No complaints of chest pain or shortness of breath.  Normal O2 sats.  Hemoglobin 7.1, will transfuse 1 unit of cryo continue with gentle IV hydration and continue to monitor heart rate. Encourage incentive spirometer Work on Psychiatrist to assist with discharge to skilled nursing facility when medically stable.  DVT Prophylaxis - Lovenox, Foot Pumps, and TED hose Weight-Bearing as tolerated to left leg   T. Rachelle Hora, PA-C [place] 06/08/2021, 8:13 AM

## 2021-06-08 NOTE — Discharge Instructions (Signed)

## 2021-06-09 LAB — TYPE AND SCREEN
ABO/RH(D): AB NEG
Antibody Screen: NEGATIVE
Unit division: 0

## 2021-06-09 LAB — BPAM RBC
Blood Product Expiration Date: 202207302359
ISSUE DATE / TIME: 202207291157
Unit Type and Rh: 9500

## 2021-06-09 NOTE — Progress Notes (Addendum)
   06/07/21 1952  Assess: MEWS Score  Temp 98 F (36.7 C)  BP (!) 119/52  Pulse Rate (!) 121  Resp 16  SpO2 100 %  O2 Device Room Air  Assess: MEWS Score  MEWS Temp 0  MEWS Systolic 0  MEWS Pulse 2  MEWS RR 0  MEWS LOC 0  MEWS Score 2  MEWS Score Color Yellow  Assess: if the MEWS score is Yellow or Red  Were vital signs taken at a resting state? Yes  Focused Assessment No change from prior assessment  Does the patient meet 2 or more of the SIRS criteria? No  MEWS guidelines implemented *See Row Information* Yes  Treat  MEWS Interventions Escalated (See documentation below)  Pain Scale 0-10  Pain Score 0  Pain Type Surgical pain  Pain Location Knee  Pain Orientation Left  Take Vital Signs  Increase Vital Sign Frequency  Yellow: Q 2hr X 2 then Q 4hr X 2, if remains yellow, continue Q 4hrs  Escalate  MEWS: Escalate Yellow: discuss with charge nurse/RN and consider discussing with provider and RRT  Notify: Charge Nurse/RN  Name of Charge Nurse/RN Notified Olivia RN  Date Charge Nurse/RN Notified 06/07/21  Time Charge Nurse/RN Notified 2005  Notify: Provider  Provider Name/Title H. Damita Dunnings  Date Provider Notified 06/07/21  Time Provider Notified 2007  Notification Type Page  Notification Reason Change in status  Document  Patient Outcome Stabilized after interventions  Progress note created (see row info) Yes (Pt is alert and orientated in bed. No c/o pain at this time. No s/sx of acute distress noted will continue to monitor)  Assess: SIRS CRITERIA  SIRS Temperature  0  SIRS Pulse 1  SIRS Respirations  0  SIRS WBC 0  SIRS Score Sum  1  Inserted for [place]

## 2021-06-11 DIAGNOSIS — M1712 Unilateral primary osteoarthritis, left knee: Secondary | ICD-10-CM | POA: Diagnosis not present

## 2021-06-11 DIAGNOSIS — I890 Lymphedema, not elsewhere classified: Secondary | ICD-10-CM | POA: Diagnosis not present

## 2021-06-11 DIAGNOSIS — E1159 Type 2 diabetes mellitus with other circulatory complications: Secondary | ICD-10-CM | POA: Diagnosis not present

## 2021-06-11 DIAGNOSIS — I10 Essential (primary) hypertension: Secondary | ICD-10-CM | POA: Diagnosis not present

## 2021-06-18 ENCOUNTER — Encounter: Payer: Self-pay | Admitting: Oncology

## 2021-06-18 ENCOUNTER — Other Ambulatory Visit: Payer: Self-pay

## 2021-06-18 ENCOUNTER — Inpatient Hospital Stay: Payer: PPO | Attending: Oncology | Admitting: Oncology

## 2021-06-18 ENCOUNTER — Inpatient Hospital Stay: Payer: PPO

## 2021-06-18 VITALS — BP 130/70 | HR 107 | Temp 97.0°F | Resp 20 | Wt 272.0 lb

## 2021-06-18 DIAGNOSIS — N189 Chronic kidney disease, unspecified: Secondary | ICD-10-CM | POA: Insufficient documentation

## 2021-06-18 DIAGNOSIS — D649 Anemia, unspecified: Secondary | ICD-10-CM

## 2021-06-18 DIAGNOSIS — Z96652 Presence of left artificial knee joint: Secondary | ICD-10-CM | POA: Diagnosis not present

## 2021-06-18 LAB — RETICULOCYTES
Immature Retic Fract: 24.4 % — ABNORMAL HIGH (ref 2.3–15.9)
RBC.: 2.82 MIL/uL — ABNORMAL LOW (ref 3.87–5.11)
Retic Count, Absolute: 100.7 10*3/uL (ref 19.0–186.0)
Retic Ct Pct: 3.6 % — ABNORMAL HIGH (ref 0.4–3.1)

## 2021-06-18 LAB — TSH: TSH: 0.51 u[IU]/mL (ref 0.350–4.500)

## 2021-06-18 LAB — COMPREHENSIVE METABOLIC PANEL
ALT: 43 U/L (ref 0–44)
AST: 46 U/L — ABNORMAL HIGH (ref 15–41)
Albumin: 3.4 g/dL — ABNORMAL LOW (ref 3.5–5.0)
Alkaline Phosphatase: 107 U/L (ref 38–126)
Anion gap: 10 (ref 5–15)
BUN: 28 mg/dL — ABNORMAL HIGH (ref 8–23)
CO2: 30 mmol/L (ref 22–32)
Calcium: 8.9 mg/dL (ref 8.9–10.3)
Chloride: 95 mmol/L — ABNORMAL LOW (ref 98–111)
Creatinine, Ser: 1.29 mg/dL — ABNORMAL HIGH (ref 0.44–1.00)
GFR, Estimated: 46 mL/min — ABNORMAL LOW (ref >60)
Glucose, Bld: 128 mg/dL — ABNORMAL HIGH (ref 70–99)
Potassium: 3.8 mmol/L (ref 3.5–5.1)
Sodium: 135 mmol/L (ref 135–145)
Total Bilirubin: 0.6 mg/dL (ref 0.3–1.2)
Total Protein: 8.1 g/dL (ref 6.5–8.1)

## 2021-06-18 LAB — CBC WITH DIFFERENTIAL/PLATELET
Abs Immature Granulocytes: 0.11 10*3/uL — ABNORMAL HIGH (ref 0.00–0.07)
Basophils Absolute: 0 10*3/uL (ref 0.0–0.1)
Basophils Relative: 0 %
Eosinophils Absolute: 0.2 10*3/uL (ref 0.0–0.5)
Eosinophils Relative: 2 %
HCT: 28 % — ABNORMAL LOW (ref 36.0–46.0)
Hemoglobin: 8.9 g/dL — ABNORMAL LOW (ref 12.0–15.0)
Immature Granulocytes: 1 %
Lymphocytes Relative: 16 %
Lymphs Abs: 1.4 10*3/uL (ref 0.7–4.0)
MCH: 31.6 pg (ref 26.0–34.0)
MCHC: 31.8 g/dL (ref 30.0–36.0)
MCV: 99.3 fL (ref 80.0–100.0)
Monocytes Absolute: 0.7 10*3/uL (ref 0.1–1.0)
Monocytes Relative: 8 %
Neutro Abs: 6.5 10*3/uL (ref 1.7–7.7)
Neutrophils Relative %: 73 %
Platelets: 309 10*3/uL (ref 150–400)
RBC: 2.82 MIL/uL — ABNORMAL LOW (ref 3.87–5.11)
RDW: 16 % — ABNORMAL HIGH (ref 11.5–15.5)
WBC: 9 10*3/uL (ref 4.0–10.5)
nRBC: 0 % (ref 0.0–0.2)

## 2021-06-18 LAB — IRON AND TIBC
Iron: 68 ug/dL (ref 28–170)
Saturation Ratios: 19 % (ref 10.4–31.8)
TIBC: 353 ug/dL (ref 250–450)
UIBC: 285 ug/dL

## 2021-06-18 LAB — FOLATE: Folate: >100 ng/mL (ref >5.9)

## 2021-06-18 LAB — VITAMIN B12: Vitamin B-12: 239 pg/mL (ref 180–914)

## 2021-06-18 LAB — FERRITIN: Ferritin: 376 ng/mL — ABNORMAL HIGH (ref 11–307)

## 2021-06-19 LAB — HAPTOGLOBIN: Haptoglobin: 372 mg/dL — ABNORMAL HIGH (ref 37–355)

## 2021-06-20 ENCOUNTER — Encounter: Payer: Self-pay | Admitting: Oncology

## 2021-06-20 NOTE — Progress Notes (Signed)
Hematology/Oncology Consult note [facility] Telephone:([phone] [phone]  Patient Care Team: Gladstone Lighter, MD as PCP - General (Internal Medicine)   Name of the patient: Autumn Burton  [id number]  [birth year]    Reason for referral-anemia   Referring physician-[place]  Date of visit: 06/20/21   History of presenting illness-patient is a 65 year old female with a past medical history significant for rheumatoid arthritis, obesity hypertension among other medical problems.  She is a resident at [facility] facility.  She has been referred to [place] for anemia. Patient was recently discharged from orthopedic service after total left knee replacement.  Patient's baseline hemoglobin runs around 10-11 since March 2021.  Prior to that her hemoglobin was closer to 12.  However since her knee replacement surgery her hemoglobin dropped from 10-7.1 gradually and had to be given a unit of blood transfusion.  White count and platelets normal.  MCV 97.7.  Patient denies any blood loss in her stool or.  Denies any dark melanotic stools.  Energy levels have been overall stable.  She is able to walk with the help of a walker around the facility.  She has some component of chronic kidney disease and her creatinine typically fluctuates between 1.1-1 1.4.  ECOG PS- 1  Pain scale- 2   Review of systems- Review of Systems  Constitutional:  Positive for malaise/fatigue. Negative for chills, fever and weight loss.  HENT:  Negative for congestion, ear discharge and nosebleeds.   Eyes:  Negative for blurred vision.  Respiratory:  Negative for cough, hemoptysis, sputum production, shortness of breath and wheezing.   Cardiovascular:  Negative for chest pain, palpitations, orthopnea and claudication.  Gastrointestinal:  Negative for abdominal pain, blood in stool, constipation, diarrhea, heartburn, melena, nausea and vomiting.  Genitourinary:  Negative for dysuria, flank  pain, frequency, hematuria and urgency.  Musculoskeletal:  Negative for back pain, joint pain and myalgias.  Skin:  Negative for rash.  Neurological:  Negative for dizziness, tingling, focal weakness, seizures, weakness and headaches.  Endo/Heme/Allergies:  Does not bruise/bleed easily.  Psychiatric/Behavioral:  Negative for depression and suicidal ideas. The patient does not have insomnia.    Allergies  Allergen Reactions   Latex Hives, Itching and Rash    Redness    Patient Active Problem List   Diagnosis Date Noted   S/P TKR (total knee replacement) using cement, left 06/05/2021   Ulcer of right leg, limited to breakdown of skin ([place]) 05/11/2021   Chronic pain of both knees 11/15/2020   History of abnormal mammogram 04/06/2020   Retaining fluid 04/06/2020   Decreased hemoglobin 04/06/2020   AKI (acute kidney injury) ([place]) 04/06/2020   History of sepsis 03/01/2020   Hypokalemia 03/01/2020   History of rhabdomyolysis 03/01/2020   Rheumatoid arthritis ([place]) 03/01/2020   Peripheral neuropathy 03/01/2020   Hx of colonic polyps    Polyp of sigmoid colon    Morbidly obese ([place]) 03/06/2016   Chronic eczema 01/17/2015   Cough due to ACE inhibitor 01/17/2015   Essential (primary) hypertension 01/17/2015   Secondary osteoarthritis of right ankle 01/17/2015   Acquired lymphedema of leg 11/25/2014   Infection and inflammatory reaction due to internal orthopedic device, implant, and graft ([place]) 05/01/2010   Pseudoarthrosis of cervical spine ([place]) 01/12/2010   Hyperthyroidism, subclinical 01/12/2010   Tachycardia 01/12/2010   Nonunion of fracture 01/12/2010     Past Medical History:  Diagnosis Date   Anemia    Arthritis    GERD (gastroesophageal reflux  disease)    Hypertension    Lymphedema of both lower extremities    being followed by Dr Rhoderick Moody Boots bil   Venous ulcer of right leg ([place])    thigh-being followed by dr dew     Past Surgical History:  Procedure  Laterality Date   COLONOSCOPY WITH PROPOFOL N/A 10/28/2016   Procedure: COLONOSCOPY WITH PROPOFOL;  Surgeon: Jonathon Bellows, MD;  Location: [facility] ENDOSCOPY;  Service: Endoscopy;  Laterality: N/A;   DG RIGHT TIBIA AND FIBULA ([place] HX)     LEG SURGERY Right    TOTAL KNEE ARTHROPLASTY Left 06/05/2021   Procedure: TOTAL KNEE ARTHROPLASTY;  Surgeon: Hessie Knows, MD;  Location: [facility] ORS;  Service: Orthopedics;  Laterality: Left;    Social History   Socioeconomic History   Marital status: Married    Spouse name: Not on file   Number of children: 1   Years of education: Not on file   Highest education level: Not on file  Occupational History   Not on file  Tobacco Use   Smoking status: Never   Smokeless tobacco: Never  Vaping Use   Vaping Use: Never used  Substance and Sexual Activity   Alcohol use: No    Alcohol/week: 0.0 standard drinks   Drug use: No   Sexual activity: Not on file  Other Topics Concern   Not on file  Social History Narrative   Not on file   Social Determinants of Health   Financial Resource Strain: Not on file  Food Insecurity: Not on file  Transportation Needs: Not on file  Physical Activity: Not on file  Stress: Not on file  Social Connections: Not on file  Intimate Partner Violence: Not on file     Family History  Problem Relation Age of Onset   Diabetes Mother    COPD Mother    Heart disease Mother    Healthy Sister    Healthy Son      Current Outpatient Medications:    acetaminophen (TYLENOL) 500 MG tablet, Take 500 mg by mouth every 6 (six) hours as needed for moderate pain., Disp: , Rfl:    calcium carbonate (TUMS - DOSED IN MG ELEMENTAL CALCIUM) 500 MG chewable tablet, Chew 1 tablet by mouth daily as needed for indigestion or heartburn., Disp: , Rfl:    docusate sodium (COLACE) 100 MG capsule, Take 1 capsule (100 mg total) by mouth 2 (two) times daily., Disp: 10 capsule, Rfl: 0   enoxaparin (LOVENOX) 40 MG/0.4ML injection, Inject 0.4 mLs (40  mg total) into the skin daily for 14 days., Disp: 5.6 mL, Rfl: 0   ferrous [id number] C-folic acid (TRINSICON / FOLTRIN) capsule, Take 1 capsule by mouth 2 (two) times daily., Disp: , Rfl:    folic acid (FOLVITE) 1 MG tablet, Take 1 mg by mouth See admin instructions. Take 1 mg by mouth daily except for Sunday, Disp: , Rfl:    furosemide (LASIX) 20 MG tablet, Take 1 tablet (20 mg total) by mouth daily., Disp: 90 tablet, Rfl: 1   gabapentin (NEURONTIN) 300 MG capsule, Take 300 mg by mouth 3 (three) times daily., Disp: , Rfl:    halobetasol (ULTRAVATE) 0.05 % ointment, Apply 1 application topically 2 (two) times daily as needed (irritation)., Disp: , Rfl:    hydrochlorothiazide (HYDRODIURIL) 25 MG tablet, Take 1 tablet (25 mg total) by mouth daily., Disp: 90 tablet, Rfl: 1   HYDROcodone-acetaminophen (NORCO/VICODIN) 5-325 MG tablet, Take 1-2 tablets by mouth every 4 (four) hours  as needed for moderate pain (pain score 4-6)., Disp: 30 tablet, Rfl: 0   methocarbamol (ROBAXIN) 500 MG tablet, Take 1 tablet (500 mg total) by mouth every 6 (six) hours as needed for muscle spasms., Disp: 30 tablet, Rfl: 0   methotrexate 2.5 MG tablet, Take 15 mg by mouth every Sunday., Disp: , Rfl:    metoprolol tartrate (LOPRESSOR) 25 MG tablet, Take 1 tablet (25 mg total) by mouth daily. (Patient taking differently: Take 25 mg by mouth every morning.), Disp: 90 tablet, Rfl: 1   olmesartan (BENICAR) 20 MG tablet, Take 1 tablet (20 mg total) by mouth daily. (Patient taking differently: Take 20 mg by mouth every morning.), Disp: 90 tablet, Rfl: 1   potassium chloride (KLOR-CON) 10 MEQ tablet, TAKE 1 TABLET BY MOUTH ONCE DAILY. TAKE WITH FUROSEMIDE. (Patient taking differently: Take 10 mEq by mouth daily.), Disp: 90 tablet, Rfl: 1   triamcinolone ointment (KENALOG) 0.1 %, Apply 1 application topically daily as needed (eczema flares)., Disp: , Rfl:    traMADol (ULTRAM) 50 MG tablet, Take 1 tablet (50 mg total) by mouth  every 6 (six) hours as needed., Disp: 30 tablet, Rfl: 0   Physical exam:  Vitals:   06/18/21 1527  BP: 130/70  Pulse: (!) 107  Resp: 20  Temp: (!) 97 F (36.1 C)  TempSrc: Tympanic  SpO2: 100%  Weight: 272 lb (123.4 kg)   Physical Exam Constitutional:      General: She is not in acute distress.    Comments: Sitting in a wheelchair.  Exam is somewhat limited as she is unable to sit up on the examination table  Cardiovascular:     Rate and Rhythm: Normal rate and regular rhythm.     Heart sounds: Normal heart sounds.  Pulmonary:     Effort: Pulmonary effort is normal.     Breath sounds: Normal breath sounds.  Abdominal:     General: Bowel sounds are normal.     Palpations: Abdomen is soft.  Skin:    General: Skin is warm and dry.  Neurological:     Mental Status: She is alert and oriented to person, place, and time.       CMP Latest Ref Rng & Units 06/18/2021  Glucose 70 - 99 mg/dL 128(H)  BUN 8 - 23 mg/dL 28(H)  Creatinine 0.44 - 1.00 mg/dL 1.29(H)  Sodium 135 - 145 mmol/L 135  Potassium 3.5 - 5.1 mmol/L 3.8  Chloride 98 - 111 mmol/L 95(L)  CO2 22 - 32 mmol/L 30  Calcium 8.9 - 10.3 mg/dL 8.9  Total Protein 6.5 - 8.1 g/dL 8.1  Total Bilirubin 0.3 - 1.2 mg/dL 0.6  Alkaline Phos 38 - 126 U/L 107  AST 15 - 41 U/L 46(H)  ALT 0 - 44 U/L 43   CBC Latest Ref Rng & Units 06/18/2021  WBC 4.0 - 10.5 K/uL 9.0  Hemoglobin 12.0 - 15.0 g/dL 8.9(L)  Hematocrit 36.0 - 46.0 % 28.0(L)  Platelets 150 - 400 K/uL 309    No images are attached to the encounter.  DG Knee 1-2 Views Left  Result Date: 06/05/2021 CLINICAL DATA:  Status post knee replacement EXAM: LEFT KNEE - 1-2 VIEW COMPARISON:  03/26/2016 FINDINGS: Left total knee prosthesis is well seated without periprosthetic fracture or lucency. Skin staples and subcutaneous emphysema consistent with immediate postop status. Deformity of the proximal fibula consistent with prior healed fracture. IMPRESSION: Uncomplicated left  total knee prosthesis with immediate postop changes. Electronically Signed  By: Sharen Heck  Mir M.D.   On: 06/05/2021 13:01    Assessment and plan- Patient is a 65 y.o. female referred for normocytic anemia:   At baseline even prior to knee surgery patient's hemoglobin runs between 10-11.  Her white count and platelets are normal and her anemia is normocytic.  Following her knee replacement surgery her hemoglobin dropped down to 7.1 and she required a blood transfusion.  I suspect a component of bone marrow suppression/postoperative anemia which should get better over the next 3 to 4 weeks.  I will do a complete anemia work-up including CBC with differential CMP ferritin and iron studies reticulocyte count B12 and folate and TSH as well as haptoglobin and myeloma panel.  Ferritin and iron studies  may be difficult to interpret since patient had a blood transfusion on 06/11/2021.  Video visit with patient in 2 weeks time   Thank you for this kind referral and the opportunity to participate in the care of this patient   Visit Diagnosis 1. Normocytic anemia     Dr. Randa Evens, MD, MPH [facility] [id number] 06/20/2021

## 2021-06-21 DIAGNOSIS — M25512 Pain in left shoulder: Secondary | ICD-10-CM | POA: Diagnosis not present

## 2021-06-21 LAB — MULTIPLE MYELOMA PANEL, SERUM
Albumin SerPl Elph-Mcnc: 3.2 g/dL (ref 2.9–4.4)
Albumin/Glob SerPl: 0.9 (ref 0.7–1.7)
Alpha 1: 0.3 g/dL (ref 0.0–0.4)
Alpha2 Glob SerPl Elph-Mcnc: 1.1 g/dL — ABNORMAL HIGH (ref 0.4–1.0)
B-Globulin SerPl Elph-Mcnc: 1.3 g/dL (ref 0.7–1.3)
Gamma Glob SerPl Elph-Mcnc: 1.2 g/dL (ref 0.4–1.8)
Globulin, Total: 3.9 g/dL (ref 2.2–3.9)
IgA: 230 mg/dL (ref 87–352)
IgG (Immunoglobin G), Serum: 1377 mg/dL (ref 586–1602)
IgM (Immunoglobulin M), Srm: 35 mg/dL (ref 26–217)
Total Protein ELP: 7.1 g/dL (ref 6.0–8.5)

## 2021-06-26 DIAGNOSIS — Z471 Aftercare following joint replacement surgery: Secondary | ICD-10-CM | POA: Diagnosis not present

## 2021-06-28 ENCOUNTER — Encounter: Payer: Self-pay | Admitting: Oncology

## 2021-06-28 ENCOUNTER — Other Ambulatory Visit: Payer: Self-pay

## 2021-06-28 ENCOUNTER — Inpatient Hospital Stay (HOSPITAL_BASED_OUTPATIENT_CLINIC_OR_DEPARTMENT_OTHER): Payer: PPO | Admitting: Oncology

## 2021-06-28 ENCOUNTER — Encounter: Payer: Self-pay | Admitting: *Deleted

## 2021-06-28 DIAGNOSIS — R32 Unspecified urinary incontinence: Secondary | ICD-10-CM | POA: Diagnosis not present

## 2021-06-28 DIAGNOSIS — Z96652 Presence of left artificial knee joint: Secondary | ICD-10-CM | POA: Diagnosis not present

## 2021-06-28 DIAGNOSIS — M1711 Unilateral primary osteoarthritis, right knee: Secondary | ICD-10-CM | POA: Diagnosis not present

## 2021-06-28 DIAGNOSIS — E1142 Type 2 diabetes mellitus with diabetic polyneuropathy: Secondary | ICD-10-CM | POA: Diagnosis not present

## 2021-06-28 DIAGNOSIS — N183 Chronic kidney disease, stage 3 unspecified: Secondary | ICD-10-CM

## 2021-06-28 DIAGNOSIS — I10 Essential (primary) hypertension: Secondary | ICD-10-CM | POA: Diagnosis not present

## 2021-06-28 DIAGNOSIS — D631 Anemia in chronic kidney disease: Secondary | ICD-10-CM | POA: Diagnosis not present

## 2021-06-28 DIAGNOSIS — Z79899 Other long term (current) drug therapy: Secondary | ICD-10-CM | POA: Diagnosis not present

## 2021-06-28 DIAGNOSIS — D649 Anemia, unspecified: Secondary | ICD-10-CM

## 2021-06-28 DIAGNOSIS — G43909 Migraine, unspecified, not intractable, without status migrainosus: Secondary | ICD-10-CM | POA: Diagnosis not present

## 2021-06-28 DIAGNOSIS — I890 Lymphedema, not elsewhere classified: Secondary | ICD-10-CM | POA: Diagnosis not present

## 2021-06-28 DIAGNOSIS — L819 Disorder of pigmentation, unspecified: Secondary | ICD-10-CM | POA: Diagnosis not present

## 2021-06-28 DIAGNOSIS — Z9181 History of falling: Secondary | ICD-10-CM | POA: Diagnosis not present

## 2021-06-28 DIAGNOSIS — Z6841 Body Mass Index (BMI) 40.0 and over, adult: Secondary | ICD-10-CM | POA: Diagnosis not present

## 2021-06-28 DIAGNOSIS — Z471 Aftercare following joint replacement surgery: Secondary | ICD-10-CM | POA: Diagnosis not present

## 2021-06-28 DIAGNOSIS — M069 Rheumatoid arthritis, unspecified: Secondary | ICD-10-CM | POA: Diagnosis not present

## 2021-06-28 NOTE — Progress Notes (Signed)
F/u on anemia

## 2021-06-29 DIAGNOSIS — I890 Lymphedema, not elsewhere classified: Secondary | ICD-10-CM | POA: Diagnosis not present

## 2021-06-29 DIAGNOSIS — M85812 Other specified disorders of bone density and structure, left shoulder: Secondary | ICD-10-CM | POA: Diagnosis not present

## 2021-06-29 DIAGNOSIS — M1712 Unilateral primary osteoarthritis, left knee: Secondary | ICD-10-CM | POA: Diagnosis not present

## 2021-06-29 DIAGNOSIS — M25512 Pain in left shoulder: Secondary | ICD-10-CM | POA: Diagnosis not present

## 2021-06-29 DIAGNOSIS — Z862 Personal history of diseases of the blood and blood-forming organs and certain disorders involving the immune mechanism: Secondary | ICD-10-CM | POA: Diagnosis not present

## 2021-06-29 DIAGNOSIS — E119 Type 2 diabetes mellitus without complications: Secondary | ICD-10-CM | POA: Diagnosis not present

## 2021-06-29 DIAGNOSIS — M069 Rheumatoid arthritis, unspecified: Secondary | ICD-10-CM | POA: Diagnosis not present

## 2021-07-01 NOTE — Progress Notes (Signed)
I connected with Autumn Burton on 07/01/21 at  8:30 AM EDT by video enabled telemedicine visit and verified that I am speaking with the correct person using two identifiers.   I discussed the limitations, risks, security and privacy concerns of performing an evaluation and management service by telemedicine and the availability of in-person appointments. I also discussed with the patient that there may be a patient responsible charge related to this service. The patient expressed understanding and agreed to proceed.  Other persons participating in the visit and their role in the encounter:  none  Patient's location:  nursing facility Provider's location:  work  Risk analyst Complaint: Discussed results of blood work  Diagnosis: Normocytic anemia possibly secondary to anemia of chronic disease and chronic kidney disease  History of present illness: patient is a 65 year old female with a past medical history significant for rheumatoid arthritis, obesity hypertension among other medical problems.  She is a resident at [facility] facility.  She has been referred to [place] for anemia. Patient was recently discharged from orthopedic service after total left knee replacement.  Patient's baseline hemoglobin runs around 10-11 since March 2021.  Prior to that her hemoglobin was closer to 12.  However since her knee replacement surgery her hemoglobin dropped from 10-7.1 gradually and had to be given a unit of blood transfusion.  White count and platelets normal.  MCV 97.7.  Patient denies any blood loss in her stool or.  Denies any dark melanotic stools.  Energy levels have been overall stable.  She is able to walk with the help of a walker around the facility.  She has some component of chronic kidney disease and her creatinine typically fluctuates between 1.1- 1.4.  Results of blood work from 06/18/2021 were as follows: CBC showed white count of 9, H&H of 8.9/28 with a platelet count of 309.  Serum creatinine mildly  elevated at 1.29.  Folate levels normal.  B12 levels low at 239.  Ferritin levels elevated at 376 and iron saturation 19% with a normal TIBC.  Reticulocyte count normal for the degree of anemia at 3.6.  Myeloma panel showed no M protein.  Haptoglobin mildly elevated at 372.  TSH normal.  Interval history patient reports improvement in her energy levels overall and she is doing well at the nursing home.  She has mild baseline fatigue but denies other new complaints at this time   Review of Systems  Constitutional:  Positive for malaise/fatigue. Negative for chills, fever and weight loss.  HENT:  Negative for congestion, ear discharge and nosebleeds.   Eyes:  Negative for blurred vision.  Respiratory:  Negative for cough, hemoptysis, sputum production, shortness of breath and wheezing.   Cardiovascular:  Negative for chest pain, palpitations, orthopnea and claudication.  Gastrointestinal:  Negative for abdominal pain, blood in stool, constipation, diarrhea, heartburn, melena, nausea and vomiting.  Genitourinary:  Negative for dysuria, flank pain, frequency, hematuria and urgency.  Musculoskeletal:  Negative for back pain, joint pain and myalgias.  Skin:  Negative for rash.  Neurological:  Negative for dizziness, tingling, focal weakness, seizures, weakness and headaches.  Endo/Heme/Allergies:  Does not bruise/bleed easily.  Psychiatric/Behavioral:  Negative for depression and suicidal ideas. The patient does not have insomnia.    Allergies  Allergen Reactions   Latex Hives, Itching and Rash    Redness    Past Medical History:  Diagnosis Date   Anemia    Arthritis    GERD (gastroesophageal reflux disease)    Hypertension  Lymphedema of both lower extremities    being followed by Dr Rhoderick Moody Boots bil   Venous ulcer of right leg ([place])    thigh-being followed by dr dew    Past Surgical History:  Procedure Laterality Date   COLONOSCOPY WITH PROPOFOL N/A 10/28/2016   Procedure:  COLONOSCOPY WITH PROPOFOL;  Surgeon: Jonathon Bellows, MD;  Location: [facility] ENDOSCOPY;  Service: Endoscopy;  Laterality: N/A;   DG RIGHT TIBIA AND FIBULA ([place] HX)     LEG SURGERY Right    TOTAL KNEE ARTHROPLASTY Left 06/05/2021   Procedure: TOTAL KNEE ARTHROPLASTY;  Surgeon: Hessie Knows, MD;  Location: [facility] ORS;  Service: Orthopedics;  Laterality: Left;    Social History   Socioeconomic History   Marital status: Married    Spouse name: Not on file   Number of children: 1   Years of education: Not on file   Highest education level: Not on file  Occupational History   Not on file  Tobacco Use   Smoking status: Never   Smokeless tobacco: Never  Vaping Use   Vaping Use: Never used  Substance and Sexual Activity   Alcohol use: No    Alcohol/week: 0.0 standard drinks   Drug use: No   Sexual activity: Not on file  Other Topics Concern   Not on file  Social History Narrative   Not on file   Social Determinants of Health   Financial Resource Strain: Not on file  Food Insecurity: Not on file  Transportation Needs: Not on file  Physical Activity: Not on file  Stress: Not on file  Social Connections: Not on file  Intimate Partner Violence: Not on file    Family History  Problem Relation Age of Onset   Diabetes Mother    COPD Mother    Heart disease Mother    Healthy Sister    Healthy Son      Current Outpatient Medications:    acetaminophen (TYLENOL) 500 MG tablet, Take 500 mg by mouth every 6 (six) hours as needed for moderate pain., Disp: , Rfl:    calcium carbonate (TUMS - DOSED IN MG ELEMENTAL CALCIUM) 500 MG chewable tablet, Chew 1 tablet by mouth daily as needed for indigestion or heartburn., Disp: , Rfl:    docusate sodium (COLACE) 100 MG capsule, Take 1 capsule (100 mg total) by mouth 2 (two) times daily., Disp: 10 capsule, Rfl: 0   ferrous [id number] C-folic acid (TRINSICON / FOLTRIN) capsule, Take 1 capsule by mouth 2 (two) times daily., Disp: , Rfl:     folic acid (FOLVITE) 1 MG tablet, Take 1 mg by mouth See admin instructions. Take 1 mg by mouth daily except for Sunday, Disp: , Rfl:    furosemide (LASIX) 20 MG tablet, Take 1 tablet (20 mg total) by mouth daily., Disp: 90 tablet, Rfl: 1   gabapentin (NEURONTIN) 300 MG capsule, Take 300 mg by mouth 3 (three) times daily., Disp: , Rfl:    halobetasol (ULTRAVATE) 0.05 % ointment, Apply 1 application topically 2 (two) times daily as needed (irritation)., Disp: , Rfl:    hydrochlorothiazide (HYDRODIURIL) 25 MG tablet, Take 1 tablet (25 mg total) by mouth daily., Disp: 90 tablet, Rfl: 1   HYDROcodone-acetaminophen (NORCO/VICODIN) 5-325 MG tablet, Take 1-2 tablets by mouth every 4 (four) hours as needed for moderate pain (pain score 4-6)., Disp: 30 tablet, Rfl: 0   methocarbamol (ROBAXIN) 500 MG tablet, Take 1 tablet (500 mg total) by mouth every 6 (six) hours as needed for  muscle spasms., Disp: 30 tablet, Rfl: 0   methotrexate 2.5 MG tablet, Take 15 mg by mouth every Sunday., Disp: , Rfl:    metoprolol tartrate (LOPRESSOR) 25 MG tablet, Take 1 tablet (25 mg total) by mouth daily. (Patient taking differently: Take 25 mg by mouth every morning.), Disp: 90 tablet, Rfl: 1   olmesartan (BENICAR) 20 MG tablet, Take 1 tablet (20 mg total) by mouth daily. (Patient taking differently: Take 20 mg by mouth every morning.), Disp: 90 tablet, Rfl: 1   potassium chloride (KLOR-CON) 10 MEQ tablet, TAKE 1 TABLET BY MOUTH ONCE DAILY. TAKE WITH FUROSEMIDE. (Patient taking differently: Take 10 mEq by mouth daily.), Disp: 90 tablet, Rfl: 1   traMADol (ULTRAM) 50 MG tablet, Take 1 tablet (50 mg total) by mouth every 6 (six) hours as needed., Disp: 30 tablet, Rfl: 0   triamcinolone ointment (KENALOG) 0.1 %, Apply 1 application topically daily as needed (eczema flares)., Disp: , Rfl:    vitamin B-12 (CYANOCOBALAMIN) 1000 MCG tablet, Take 1,000 mcg by mouth daily., Disp: , Rfl:   DG Knee 1-2 Views Left  Result Date:  06/05/2021 CLINICAL DATA:  Status post knee replacement EXAM: LEFT KNEE - 1-2 VIEW COMPARISON:  03/26/2016 FINDINGS: Left total knee prosthesis is well seated without periprosthetic fracture or lucency. Skin staples and subcutaneous emphysema consistent with immediate postop status. Deformity of the proximal fibula consistent with prior healed fracture. IMPRESSION: Uncomplicated left total knee prosthesis with immediate postop changes. Electronically Signed   By: Miachel Roux M.D.   On: 06/05/2021 13:01    No images are attached to the encounter.   CMP Latest Ref Rng & Units 06/18/2021  Glucose 70 - 99 mg/dL 128(H)  BUN 8 - 23 mg/dL 28(H)  Creatinine 0.44 - 1.00 mg/dL 1.29(H)  Sodium 135 - 145 mmol/L 135  Potassium 3.5 - 5.1 mmol/L 3.8  Chloride 98 - 111 mmol/L 95(L)  CO2 22 - 32 mmol/L 30  Calcium 8.9 - 10.3 mg/dL 8.9  Total Protein 6.5 - 8.1 g/dL 8.1  Total Bilirubin 0.3 - 1.2 mg/dL 0.6  Alkaline Phos 38 - 126 U/L 107  AST 15 - 41 U/L 46(H)  ALT 0 - 44 U/L 43   CBC Latest Ref Rng & Units 06/18/2021  WBC 4.0 - 10.5 K/uL 9.0  Hemoglobin 12.0 - 15.0 g/dL 8.9(L)  Hematocrit 36.0 - 46.0 % 28.0(L)  Platelets 150 - 400 K/uL 309     Observation/objective: Appears in no acute distress over video visit today.  Breathing is nonlabored  Assessment and plan: Patient is a 65 year old female referred for normocytic anemia likely a combination of anemia of chronic disease and anemia of chronic kidney disease.   Patient has some degree of baseline anemia and her hemoglobin has been between 10-11 since March 2021.  Prior to that her hemoglobin was closer to normal at 12.  Results of recent anemia blood work shows elevated ferritin with an iron saturation of 19% and a TIBC that was normal.  She does not appear to have significant iron deficiency and the ferritin level can certainly be elevated as an acute phase reactant.  Haptoglobin TSH myeloma panel folate were normal.  B12 levels were mildly low at  239.  Have asked the patient to take oral B12 1000 mcg daily and I will continue to monitor her anemia closely with repeat CBC ferritin and iron studies in 2 months in 4 months.  If there is no significant improvement in her anemia I  will first give her a trial of IV iron before proceeding with Retacrit  Follow-up instructions: As above  I discussed the assessment and treatment plan with the patient. The patient was provided an opportunity to ask questions and all were answered. The patient agreed with the plan and demonstrated an understanding of the instructions.   The patient was advised to call back or seek an in-person evaluation if the symptoms worsen or if the condition fails to improve as anticipated. Visit Diagnosis: 1. Anemia of chronic kidney failure, stage 3 (moderate) (HCC)     Dr. Randa Evens, MD, MPH [facility] Tel- [phone] 07/01/2021 11:47 AM

## 2021-07-03 DIAGNOSIS — E1142 Type 2 diabetes mellitus with diabetic polyneuropathy: Secondary | ICD-10-CM | POA: Diagnosis not present

## 2021-07-03 DIAGNOSIS — I10 Essential (primary) hypertension: Secondary | ICD-10-CM | POA: Diagnosis not present

## 2021-07-03 DIAGNOSIS — Z96652 Presence of left artificial knee joint: Secondary | ICD-10-CM | POA: Diagnosis not present

## 2021-07-03 DIAGNOSIS — M170 Bilateral primary osteoarthritis of knee: Secondary | ICD-10-CM | POA: Diagnosis not present

## 2021-07-03 DIAGNOSIS — L819 Disorder of pigmentation, unspecified: Secondary | ICD-10-CM | POA: Diagnosis not present

## 2021-07-03 DIAGNOSIS — Z9181 History of falling: Secondary | ICD-10-CM | POA: Diagnosis not present

## 2021-07-03 DIAGNOSIS — M069 Rheumatoid arthritis, unspecified: Secondary | ICD-10-CM | POA: Diagnosis not present

## 2021-07-03 DIAGNOSIS — Z471 Aftercare following joint replacement surgery: Secondary | ICD-10-CM | POA: Diagnosis not present

## 2021-07-03 DIAGNOSIS — G43909 Migraine, unspecified, not intractable, without status migrainosus: Secondary | ICD-10-CM | POA: Diagnosis not present

## 2021-07-03 DIAGNOSIS — R32 Unspecified urinary incontinence: Secondary | ICD-10-CM | POA: Diagnosis not present

## 2021-07-03 DIAGNOSIS — I890 Lymphedema, not elsewhere classified: Secondary | ICD-10-CM | POA: Diagnosis not present

## 2021-07-03 DIAGNOSIS — Z6841 Body Mass Index (BMI) 40.0 and over, adult: Secondary | ICD-10-CM | POA: Diagnosis not present

## 2021-07-03 DIAGNOSIS — Z79899 Other long term (current) drug therapy: Secondary | ICD-10-CM | POA: Diagnosis not present

## 2021-07-03 DIAGNOSIS — M1711 Unilateral primary osteoarthritis, right knee: Secondary | ICD-10-CM | POA: Diagnosis not present

## 2021-07-06 DIAGNOSIS — M1711 Unilateral primary osteoarthritis, right knee: Secondary | ICD-10-CM | POA: Diagnosis not present

## 2021-07-06 DIAGNOSIS — M170 Bilateral primary osteoarthritis of knee: Secondary | ICD-10-CM | POA: Diagnosis not present

## 2021-07-06 DIAGNOSIS — G43909 Migraine, unspecified, not intractable, without status migrainosus: Secondary | ICD-10-CM | POA: Diagnosis not present

## 2021-07-06 DIAGNOSIS — I10 Essential (primary) hypertension: Secondary | ICD-10-CM | POA: Diagnosis not present

## 2021-07-06 DIAGNOSIS — Z471 Aftercare following joint replacement surgery: Secondary | ICD-10-CM | POA: Diagnosis not present

## 2021-07-06 DIAGNOSIS — Z9181 History of falling: Secondary | ICD-10-CM | POA: Diagnosis not present

## 2021-07-06 DIAGNOSIS — Z79899 Other long term (current) drug therapy: Secondary | ICD-10-CM | POA: Diagnosis not present

## 2021-07-06 DIAGNOSIS — Z6841 Body Mass Index (BMI) 40.0 and over, adult: Secondary | ICD-10-CM | POA: Diagnosis not present

## 2021-07-06 DIAGNOSIS — Z96652 Presence of left artificial knee joint: Secondary | ICD-10-CM | POA: Diagnosis not present

## 2021-07-06 DIAGNOSIS — M069 Rheumatoid arthritis, unspecified: Secondary | ICD-10-CM | POA: Diagnosis not present

## 2021-07-06 DIAGNOSIS — L819 Disorder of pigmentation, unspecified: Secondary | ICD-10-CM | POA: Diagnosis not present

## 2021-07-06 DIAGNOSIS — R32 Unspecified urinary incontinence: Secondary | ICD-10-CM | POA: Diagnosis not present

## 2021-07-06 DIAGNOSIS — I890 Lymphedema, not elsewhere classified: Secondary | ICD-10-CM | POA: Diagnosis not present

## 2021-07-06 DIAGNOSIS — E1142 Type 2 diabetes mellitus with diabetic polyneuropathy: Secondary | ICD-10-CM | POA: Diagnosis not present

## 2021-07-12 DIAGNOSIS — Z79899 Other long term (current) drug therapy: Secondary | ICD-10-CM | POA: Diagnosis not present

## 2021-07-12 DIAGNOSIS — I890 Lymphedema, not elsewhere classified: Secondary | ICD-10-CM | POA: Diagnosis not present

## 2021-07-12 DIAGNOSIS — I10 Essential (primary) hypertension: Secondary | ICD-10-CM | POA: Diagnosis not present

## 2021-07-12 DIAGNOSIS — L819 Disorder of pigmentation, unspecified: Secondary | ICD-10-CM | POA: Diagnosis not present

## 2021-07-12 DIAGNOSIS — G43909 Migraine, unspecified, not intractable, without status migrainosus: Secondary | ICD-10-CM | POA: Diagnosis not present

## 2021-07-12 DIAGNOSIS — Z9181 History of falling: Secondary | ICD-10-CM | POA: Diagnosis not present

## 2021-07-12 DIAGNOSIS — Z6841 Body Mass Index (BMI) 40.0 and over, adult: Secondary | ICD-10-CM | POA: Diagnosis not present

## 2021-07-12 DIAGNOSIS — E1142 Type 2 diabetes mellitus with diabetic polyneuropathy: Secondary | ICD-10-CM | POA: Diagnosis not present

## 2021-07-12 DIAGNOSIS — M170 Bilateral primary osteoarthritis of knee: Secondary | ICD-10-CM | POA: Diagnosis not present

## 2021-07-12 DIAGNOSIS — Z96652 Presence of left artificial knee joint: Secondary | ICD-10-CM | POA: Diagnosis not present

## 2021-07-12 DIAGNOSIS — R32 Unspecified urinary incontinence: Secondary | ICD-10-CM | POA: Diagnosis not present

## 2021-07-12 DIAGNOSIS — M1711 Unilateral primary osteoarthritis, right knee: Secondary | ICD-10-CM | POA: Diagnosis not present

## 2021-07-12 DIAGNOSIS — Z471 Aftercare following joint replacement surgery: Secondary | ICD-10-CM | POA: Diagnosis not present

## 2021-07-12 DIAGNOSIS — M069 Rheumatoid arthritis, unspecified: Secondary | ICD-10-CM | POA: Diagnosis not present

## 2021-07-18 DIAGNOSIS — Z96652 Presence of left artificial knee joint: Secondary | ICD-10-CM | POA: Diagnosis not present

## 2021-07-18 DIAGNOSIS — M170 Bilateral primary osteoarthritis of knee: Secondary | ICD-10-CM | POA: Diagnosis not present

## 2021-07-19 DIAGNOSIS — M1711 Unilateral primary osteoarthritis, right knee: Secondary | ICD-10-CM | POA: Diagnosis not present

## 2021-07-19 DIAGNOSIS — Z471 Aftercare following joint replacement surgery: Secondary | ICD-10-CM | POA: Diagnosis not present

## 2021-07-19 DIAGNOSIS — I890 Lymphedema, not elsewhere classified: Secondary | ICD-10-CM | POA: Diagnosis not present

## 2021-07-19 DIAGNOSIS — R32 Unspecified urinary incontinence: Secondary | ICD-10-CM | POA: Diagnosis not present

## 2021-07-19 DIAGNOSIS — M069 Rheumatoid arthritis, unspecified: Secondary | ICD-10-CM | POA: Diagnosis not present

## 2021-07-19 DIAGNOSIS — Z79899 Other long term (current) drug therapy: Secondary | ICD-10-CM | POA: Diagnosis not present

## 2021-07-19 DIAGNOSIS — M170 Bilateral primary osteoarthritis of knee: Secondary | ICD-10-CM | POA: Diagnosis not present

## 2021-07-19 DIAGNOSIS — E1142 Type 2 diabetes mellitus with diabetic polyneuropathy: Secondary | ICD-10-CM | POA: Diagnosis not present

## 2021-07-19 DIAGNOSIS — Z9181 History of falling: Secondary | ICD-10-CM | POA: Diagnosis not present

## 2021-07-19 DIAGNOSIS — Z96652 Presence of left artificial knee joint: Secondary | ICD-10-CM | POA: Diagnosis not present

## 2021-07-19 DIAGNOSIS — G43909 Migraine, unspecified, not intractable, without status migrainosus: Secondary | ICD-10-CM | POA: Diagnosis not present

## 2021-07-19 DIAGNOSIS — L819 Disorder of pigmentation, unspecified: Secondary | ICD-10-CM | POA: Diagnosis not present

## 2021-07-19 DIAGNOSIS — Z6841 Body Mass Index (BMI) 40.0 and over, adult: Secondary | ICD-10-CM | POA: Diagnosis not present

## 2021-07-19 DIAGNOSIS — I10 Essential (primary) hypertension: Secondary | ICD-10-CM | POA: Diagnosis not present

## 2021-07-20 ENCOUNTER — Other Ambulatory Visit: Payer: Self-pay

## 2021-07-20 ENCOUNTER — Encounter: Payer: Self-pay | Admitting: Oncology

## 2021-07-20 MED ORDER — FE FUMARATE-B12-VIT C-FA-IFC PO CAPS: 1.0000 | ORAL_CAPSULE | Freq: Two times a day (BID) | ORAL | Status: DC

## 2021-07-20 NOTE — Telephone Encounter (Signed)
Can you send it? Not sure why it cant be taken OTC

## 2021-07-29 ENCOUNTER — Other Ambulatory Visit: Payer: Self-pay | Admitting: Physician Assistant

## 2021-07-29 DIAGNOSIS — R609 Edema, unspecified: Secondary | ICD-10-CM

## 2021-07-30 ENCOUNTER — Telehealth: Payer: Self-pay | Admitting: *Deleted

## 2021-07-30 NOTE — Telephone Encounter (Signed)
Patient called stating that the iron pill sent in for her is over the counter and costs $60 for 30 tabs; and she cannot afford that. She is asking if something cheaper could be ordered. Please advise

## 2021-07-30 NOTE — Telephone Encounter (Signed)
There are OTC iron tab that cose 6$ for 100 tab. [facility] and see which OTC brad is cheaper instead of prescription?

## 2021-07-30 NOTE — Telephone Encounter (Signed)
Call returned to patient and informed her that she can go to [facility] or [facility] and get 100 tabs for $6 per Dr Janese Banks. I google searched this for her also and told her price and that she needs to get the 65 mg iron and she thanked me for letting her know

## 2021-08-16 ENCOUNTER — Telehealth: Payer: Self-pay | Admitting: *Deleted

## 2021-08-16 NOTE — Telephone Encounter (Signed)
Patient called and wanted to see if she can get PCP labs drawn here at cancer center so that she does not have to go to PCP the next week. I told her that I will ask Janese Banks but she is off for next 2 days. I told her she can call the PCP office and ask them to send the the labs that they need to Dr. Janese Banks office at fax # [phone]. She will do that .

## 2021-08-28 ENCOUNTER — Other Ambulatory Visit: Payer: Self-pay

## 2021-08-28 ENCOUNTER — Inpatient Hospital Stay: Payer: PPO | Attending: Oncology

## 2021-08-28 DIAGNOSIS — D631 Anemia in chronic kidney disease: Secondary | ICD-10-CM | POA: Insufficient documentation

## 2021-08-28 DIAGNOSIS — N183 Chronic kidney disease, stage 3 unspecified: Secondary | ICD-10-CM | POA: Insufficient documentation

## 2021-08-28 LAB — COMPREHENSIVE METABOLIC PANEL
ALT: 15 U/L (ref 0–44)
AST: 23 U/L (ref 15–41)
Albumin: 4 g/dL (ref 3.5–5.0)
Alkaline Phosphatase: 83 U/L (ref 38–126)
Anion gap: 11 (ref 5–15)
BUN: 27 mg/dL — ABNORMAL HIGH (ref 8–23)
CO2: 24 mmol/L (ref 22–32)
Calcium: 9.2 mg/dL (ref 8.9–10.3)
Chloride: 102 mmol/L (ref 98–111)
Creatinine, Ser: 1.09 mg/dL — ABNORMAL HIGH (ref 0.44–1.00)
GFR, Estimated: 56 mL/min — ABNORMAL LOW (ref >60)
Glucose, Bld: 106 mg/dL — ABNORMAL HIGH (ref 70–99)
Potassium: 3.7 mmol/L (ref 3.5–5.1)
Sodium: 137 mmol/L (ref 135–145)
Total Bilirubin: 0.4 mg/dL (ref 0.3–1.2)
Total Protein: 8.2 g/dL — ABNORMAL HIGH (ref 6.5–8.1)

## 2021-08-28 LAB — IRON AND TIBC
Iron: 78 ug/dL (ref 28–170)
Saturation Ratios: 20 % (ref 10.4–31.8)
TIBC: 382 ug/dL (ref 250–450)
UIBC: 304 ug/dL

## 2021-08-28 LAB — CBC WITH DIFFERENTIAL/PLATELET
Abs Immature Granulocytes: 0.01 10*3/uL (ref 0.00–0.07)
Basophils Absolute: 0 10*3/uL (ref 0.0–0.1)
Basophils Relative: 0 %
Eosinophils Absolute: 0.2 10*3/uL (ref 0.0–0.5)
Eosinophils Relative: 4 %
HCT: 33.7 % — ABNORMAL LOW (ref 36.0–46.0)
Hemoglobin: 10.9 g/dL — ABNORMAL LOW (ref 12.0–15.0)
Immature Granulocytes: 0 %
Lymphocytes Relative: 27 %
Lymphs Abs: 1.3 10*3/uL (ref 0.7–4.0)
MCH: 31.5 pg (ref 26.0–34.0)
MCHC: 32.3 g/dL (ref 30.0–36.0)
MCV: 97.4 fL (ref 80.0–100.0)
Monocytes Absolute: 0.3 10*3/uL (ref 0.1–1.0)
Monocytes Relative: 6 %
Neutro Abs: 3.1 10*3/uL (ref 1.7–7.7)
Neutrophils Relative %: 63 %
Platelets: 238 10*3/uL (ref 150–400)
RBC: 3.46 MIL/uL — ABNORMAL LOW (ref 3.87–5.11)
RDW: 15.3 % (ref 11.5–15.5)
WBC: 4.9 10*3/uL (ref 4.0–10.5)
nRBC: 0 % (ref 0.0–0.2)

## 2021-08-28 LAB — VITAMIN D 25 HYDROXY (VIT D DEFICIENCY, FRACTURES): Vit D, 25-Hydroxy: 10.48 ng/mL — ABNORMAL LOW (ref 30–100)

## 2021-08-28 LAB — HEMOGLOBIN A1C
Hgb A1c MFr Bld: 6.5 % — ABNORMAL HIGH (ref 4.8–5.6)
Mean Plasma Glucose: 139.85 mg/dL

## 2021-08-28 LAB — FERRITIN: Ferritin: 195 ng/mL (ref 11–307)

## 2021-08-28 LAB — VITAMIN B12: Vitamin B-12: 436 pg/mL (ref 180–914)

## 2021-08-29 ENCOUNTER — Telehealth: Payer: Self-pay

## 2021-08-29 NOTE — Telephone Encounter (Signed)
Sent over PCP's lab order results (Hgb,CBC,CMP,Vit D 25 HYDROXY,Iron,Ferr)to Dr. Wyatt Portela office at [phone] and recieved fax confirmation.

## 2021-09-17 DIAGNOSIS — E119 Type 2 diabetes mellitus without complications: Secondary | ICD-10-CM | POA: Diagnosis not present

## 2021-09-17 DIAGNOSIS — Z96652 Presence of left artificial knee joint: Secondary | ICD-10-CM | POA: Diagnosis not present

## 2021-09-17 DIAGNOSIS — M1712 Unilateral primary osteoarthritis, left knee: Secondary | ICD-10-CM | POA: Diagnosis not present

## 2021-09-17 DIAGNOSIS — Z780 Asymptomatic menopausal state: Secondary | ICD-10-CM | POA: Diagnosis not present

## 2021-09-17 DIAGNOSIS — Z853 Personal history of malignant neoplasm of breast: Secondary | ICD-10-CM | POA: Diagnosis not present

## 2021-09-17 DIAGNOSIS — Z0000 Encounter for general adult medical examination without abnormal findings: Secondary | ICD-10-CM | POA: Diagnosis not present

## 2021-09-17 DIAGNOSIS — M069 Rheumatoid arthritis, unspecified: Secondary | ICD-10-CM | POA: Diagnosis not present

## 2021-09-17 DIAGNOSIS — Z79899 Other long term (current) drug therapy: Secondary | ICD-10-CM | POA: Diagnosis not present

## 2021-09-17 DIAGNOSIS — M170 Bilateral primary osteoarthritis of knee: Secondary | ICD-10-CM | POA: Diagnosis not present

## 2021-09-17 DIAGNOSIS — M7050 Other bursitis of knee, unspecified knee: Secondary | ICD-10-CM | POA: Diagnosis not present

## 2021-09-17 DIAGNOSIS — G5792 Unspecified mononeuropathy of left lower limb: Secondary | ICD-10-CM | POA: Diagnosis not present

## 2021-09-17 DIAGNOSIS — I890 Lymphedema, not elsewhere classified: Secondary | ICD-10-CM | POA: Diagnosis not present

## 2021-09-19 ENCOUNTER — Other Ambulatory Visit: Payer: Self-pay | Admitting: Internal Medicine

## 2021-09-19 DIAGNOSIS — Z1231 Encounter for screening mammogram for malignant neoplasm of breast: Secondary | ICD-10-CM

## 2021-09-29 ENCOUNTER — Other Ambulatory Visit: Payer: Self-pay

## 2021-09-29 ENCOUNTER — Emergency Department
Admission: EM | Admit: 2021-09-29 | Discharge: 2021-09-29 | Disposition: A | Discharge status: Home / Self Care | Payer: PPO | Attending: Student in an Organized Health Care Education/Training Program | Admitting: Student in an Organized Health Care Education/Training Program

## 2021-09-29 ENCOUNTER — Emergency Department: Payer: PPO

## 2021-09-29 DIAGNOSIS — K929 Disease of digestive system, unspecified: Secondary | ICD-10-CM | POA: Insufficient documentation

## 2021-09-29 DIAGNOSIS — I10 Essential (primary) hypertension: Secondary | ICD-10-CM | POA: Diagnosis not present

## 2021-09-29 DIAGNOSIS — N938 Other specified abnormal uterine and vaginal bleeding: Secondary | ICD-10-CM | POA: Insufficient documentation

## 2021-09-29 DIAGNOSIS — N939 Abnormal uterine and vaginal bleeding, unspecified: Secondary | ICD-10-CM | POA: Diagnosis not present

## 2021-09-29 DIAGNOSIS — R9389 Abnormal findings on diagnostic imaging of other specified body structures: Secondary | ICD-10-CM | POA: Diagnosis not present

## 2021-09-29 DIAGNOSIS — N950 Postmenopausal bleeding: Secondary | ICD-10-CM | POA: Diagnosis not present

## 2021-09-29 DIAGNOSIS — D259 Leiomyoma of uterus, unspecified: Secondary | ICD-10-CM | POA: Diagnosis not present

## 2021-09-29 DIAGNOSIS — Z91040 Latex allergy status: Secondary | ICD-10-CM | POA: Diagnosis not present

## 2021-09-29 DIAGNOSIS — R000 Tachycardia, unspecified: Secondary | ICD-10-CM | POA: Diagnosis not present

## 2021-09-29 DIAGNOSIS — Z96652 Presence of left artificial knee joint: Secondary | ICD-10-CM | POA: Diagnosis not present

## 2021-09-29 LAB — COMPREHENSIVE METABOLIC PANEL
ALT: 12 U/L (ref 0–44)
AST: 17 U/L (ref 15–41)
Albumin: 3.6 g/dL (ref 3.5–5.0)
Alkaline Phosphatase: 74 U/L (ref 38–126)
Anion gap: 10 (ref 5–15)
BUN: 16 mg/dL (ref 8–23)
CO2: 25 mmol/L (ref 22–32)
Calcium: 9.3 mg/dL (ref 8.9–10.3)
Chloride: 104 mmol/L (ref 98–111)
Creatinine, Ser: 1.12 mg/dL — ABNORMAL HIGH (ref 0.44–1.00)
GFR, Estimated: 55 mL/min — ABNORMAL LOW (ref >60)
Glucose, Bld: 97 mg/dL (ref 70–99)
Potassium: 3.7 mmol/L (ref 3.5–5.1)
Sodium: 139 mmol/L (ref 135–145)
Total Bilirubin: 0.7 mg/dL (ref 0.3–1.2)
Total Protein: 7.5 g/dL (ref 6.5–8.1)

## 2021-09-29 LAB — CBC
HCT: 30.1 % — ABNORMAL LOW (ref 36.0–46.0)
Hemoglobin: 10 g/dL — ABNORMAL LOW (ref 12.0–15.0)
MCH: 31.7 pg (ref 26.0–34.0)
MCHC: 33.2 g/dL (ref 30.0–36.0)
MCV: 95.6 fL (ref 80.0–100.0)
Platelets: 257 10*3/uL (ref 150–400)
RBC: 3.15 MIL/uL — ABNORMAL LOW (ref 3.87–5.11)
RDW: 14.7 % (ref 11.5–15.5)
WBC: 7.3 10*3/uL (ref 4.0–10.5)
nRBC: 0 % (ref 0.0–0.2)

## 2021-09-29 LAB — TYPE AND SCREEN
ABO/RH(D): AB NEG
Antibody Screen: POSITIVE

## 2021-09-29 NOTE — ED Triage Notes (Signed)
Pt to ED for GI bleeding that started at 0300 this am. Pt reports soiling bed, 2 chairs at house, passing multiple blood clots. Has been through multiple chux, underwear, pads and pants today.  +dizziness that started this am.  No blood thinners

## 2021-09-29 NOTE — ED Provider Notes (Signed)
[facility] Emergency Department Provider Note    Event Date/Time   First MD Initiated Contact with Patient 09/29/21 1345     (approximate)  I have reviewed the triage vital signs and the nursing notes.   HISTORY  Chief Complaint GI Problem    HPI Autumn Burton is a 65 y.o. female who is not on blood thinners presents to the ER for evaluation of bright red blood starting this morning.  Denies any abdominal pain.  States that she did have some loose stool over the past 2 to 3 days.  States her stool is typically dark if she takes iron.  She is uncertain as to whether her bleeding today is vaginal or rectal.  States that she has been through 5 pads.  Denies any chest pain or shortness of breath.  No weakness.  Past Medical History:  Diagnosis Date   Anemia    Arthritis    GERD (gastroesophageal reflux disease)    Hypertension    Lymphedema of both lower extremities    being followed by Dr Rhoderick Moody Boots bil   Venous ulcer of right leg ([place])    thigh-being followed by dr dew   Family History  Problem Relation Age of Onset   Diabetes Mother    COPD Mother    Heart disease Mother    Healthy Sister    Healthy Son    Past Surgical History:  Procedure Laterality Date   COLONOSCOPY WITH PROPOFOL N/A 10/28/2016   Procedure: COLONOSCOPY WITH PROPOFOL;  Surgeon: Jonathon Bellows, MD;  Location: [facility] ENDOSCOPY;  Service: Endoscopy;  Laterality: N/A;   DG RIGHT TIBIA AND FIBULA ([place] HX)     LEG SURGERY Right    TOTAL KNEE ARTHROPLASTY Left 06/05/2021   Procedure: TOTAL KNEE ARTHROPLASTY;  Surgeon: Hessie Knows, MD;  Location: [facility] ORS;  Service: Orthopedics;  Laterality: Left;   Patient Active Problem List   Diagnosis Date Noted   S/P TKR (total knee replacement) using cement, left 06/05/2021   Ulcer of right leg, limited to breakdown of skin ([place]) 05/11/2021   Chronic pain of both knees 11/15/2020   History of abnormal mammogram 04/06/2020   Retaining  fluid 04/06/2020   Decreased hemoglobin 04/06/2020   AKI (acute kidney injury) ([place]) 04/06/2020   History of sepsis 03/01/2020   Hypokalemia 03/01/2020   History of rhabdomyolysis 03/01/2020   Rheumatoid arthritis ([place]) 03/01/2020   Peripheral neuropathy 03/01/2020   Hx of colonic polyps    Polyp of sigmoid colon    Morbidly obese ([place]) 03/06/2016   Chronic eczema 01/17/2015   Cough due to ACE inhibitor 01/17/2015   Essential (primary) hypertension 01/17/2015   Secondary osteoarthritis of right ankle 01/17/2015   Acquired lymphedema of leg 11/25/2014   Infection and inflammatory reaction due to internal orthopedic device, implant, and graft ([place]) 05/01/2010   Pseudoarthrosis of cervical spine ([place]) 01/12/2010   Hyperthyroidism, subclinical 01/12/2010   Tachycardia 01/12/2010   Nonunion of fracture 01/12/2010      Prior to Admission medications   Medication Sig Start Date End Date Taking? Authorizing Provider  acetaminophen (TYLENOL) 500 MG tablet Take 500 mg by mouth every 6 (six) hours as needed for moderate pain.    [provider]  calcium carbonate (TUMS - DOSED IN MG ELEMENTAL CALCIUM) 500 MG chewable tablet Chew 1 tablet by mouth daily as needed for indigestion or heartburn.    [provider]  docusate sodium (COLACE) 100 MG capsule Take 1  capsule (100 mg total) by mouth 2 (two) times daily. 06/08/21   Duanne Guess, PA-C  ferrous [id number] C-folic acid (TRINSICON / FOLTRIN) capsule Take 1 capsule by mouth 2 (two) times daily. 07/20/21   Sindy Guadeloupe, MD  folic acid (FOLVITE) 1 MG tablet Take 1 mg by mouth See admin instructions. Take 1 mg by mouth daily except for Sunday 01/25/20   [provider]  furosemide (LASIX) 20 MG tablet Take 1 tablet (20 mg total) by mouth daily. 11/15/20   Mar Daring, PA-C  gabapentin (NEURONTIN) 300 MG capsule Take 300 mg by mouth 3 (three) times daily. 09/09/19 08/13/21  [provider]   halobetasol (ULTRAVATE) 0.05 % ointment Apply 1 application topically 2 (two) times daily as needed (irritation). 10/24/20   [provider]  hydrochlorothiazide (HYDRODIURIL) 25 MG tablet Take 1 tablet (25 mg total) by mouth daily. 11/15/20   Mar Daring, PA-C  HYDROcodone-acetaminophen (NORCO/VICODIN) 5-325 MG tablet Take 1-2 tablets by mouth every 4 (four) hours as needed for moderate pain (pain score 4-6). 06/08/21   Duanne Guess, PA-C  methocarbamol (ROBAXIN) 500 MG tablet Take 1 tablet (500 mg total) by mouth every 6 (six) hours as needed for muscle spasms. 06/08/21   Duanne Guess, PA-C  methotrexate 2.5 MG tablet Take 15 mg by mouth every Sunday. 02/23/20   [provider]  metoprolol tartrate (LOPRESSOR) 25 MG tablet Take 1 tablet (25 mg total) by mouth daily. Patient taking differently: Take 25 mg by mouth every morning. 11/15/20   Mar Daring, PA-C  olmesartan (BENICAR) 20 MG tablet Take 1 tablet (20 mg total) by mouth daily. Patient taking differently: Take 20 mg by mouth every morning. 11/15/20   Mar Daring, PA-C  potassium chloride (KLOR-CON) 10 MEQ tablet TAKE 1 TABLET BY MOUTH ONCE DAILY. TAKE WITH FUROSEMIDE. Patient taking differently: Take 10 mEq by mouth daily. 11/15/20   Mar Daring, PA-C  traMADol (ULTRAM) 50 MG tablet Take 1 tablet (50 mg total) by mouth every 6 (six) hours as needed. 06/08/21   Duanne Guess, PA-C  triamcinolone ointment (KENALOG) 0.1 % Apply 1 application topically daily as needed (eczema flares). 05/23/20   [provider]  vitamin B-12 (CYANOCOBALAMIN) 1000 MCG tablet Take 1,000 mcg by mouth daily.    [provider]    Allergies Latex    Social History Social History   Tobacco Use   Smoking status: Never   Smokeless tobacco: Never  Vaping Use   Vaping Use: Never used  Substance Use Topics   Alcohol use: No    Alcohol/week: 0.0 standard drinks   Drug use: No    Review  of Systems Patient denies headaches, rhinorrhea, blurry vision, numbness, shortness of breath, chest pain, edema, cough, abdominal pain, nausea, vomiting, diarrhea, dysuria, fevers, rashes or hallucinations unless otherwise stated above in HPI. ____________________________________________   PHYSICAL EXAM:  VITAL SIGNS: Vitals:   09/29/21 1350 09/29/21 1520  BP: (!) 144/78 (!) 145/83  Pulse: (!) 105 100  Resp: (!) 26 18  Temp:  98.7 F (37.1 C)  SpO2: 100% 98%    Constitutional: Alert and oriented.  Eyes: Conjunctivae are normal.  Head: Atraumatic. Nose: No congestion/rhinnorhea. Mouth/Throat: Mucous membranes are moist.   Neck: No stridor. Painless ROM.  Cardiovascular: Normal rate, regular rhythm. Grossly normal heart sounds.  Good peripheral circulation. Respiratory: Normal respiratory effort.  No retractions. Lungs CTAB. Gastrointestinal: Soft and nontender. No distention. No abdominal  bruits. No CVA tenderness. Genitourinary: Golf ball sized clot found in vaginal canal.  Has mild amount of bleeding coming from cervical os.  No rectal bleeding Musculoskeletal: No lower extremity tenderness nor edema.  No joint effusions. Neurologic:  Normal speech and language. No gross focal neurologic deficits are appreciated. No facial droop Skin:  Skin is warm, dry and intact. No rash noted. Psychiatric: Mood and affect are normal. Speech and behavior are normal.  ____________________________________________   LABS (all labs ordered are listed, but only abnormal results are displayed)  No results found for this or any previous visit (from the past 24 hour(s)). ____________________________________________  EKG My review and personal interpretation at Time: 13:49   Indication: bleeding  Rate: 105  Rhythm: sinus Axis: normal Other: normal intervals, no stemi ____________________________________________  RADIOLOGY  I personally reviewed all radiographic images ordered to evaluate  for the above acute complaints and reviewed radiology reports and findings.  These findings were personally discussed with the patient.  Please see medical record for radiology report.  ____________________________________________   PROCEDURES  Procedure(s) performed:  Procedures    Critical Care performed: no ____________________________________________   INITIAL IMPRESSION / ASSESSMENT AND PLAN / ED COURSE  Pertinent labs & imaging results that were available during my care of the patient were reviewed by me and considered in my medical decision making (see chart for details).   DDX: DVT, AUB, fibroid, mass, hemorrhoid, laceration  Autumn Burton is a 65 y.o. who presents to the ED with presentation as described above.  Patient with evidence of vaginal bleeding.  She is mildly tachycardic but not hypotensive mentating well.  Blood work was sent for the but differential.  Will order ultrasound.  Patient will be signed out to oncoming physician pending [place] and blood work.     The patient was evaluated in Emergency Department today for the symptoms described in the history of present illness. He/she was evaluated in the context of the global COVID-19 pandemic, which necessitated consideration that the patient might be at risk for infection with the SARS-CoV-2 virus that causes COVID-19. Institutional protocols and algorithms that pertain to the evaluation of patients at risk for COVID-19 are in a state of rapid change based on information released by regulatory bodies including the CDC and federal and state organizations. These policies and algorithms were followed during the patient's care in the ED.  As part of my medical decision making, I reviewed the following data within the [place] notes reviewed and incorporated, Labs reviewed, notes from prior ED visits and [place] Controlled Substance Database   ____________________________________________   FINAL  CLINICAL IMPRESSION(S) / ED DIAGNOSES  Final diagnoses:  Vaginal bleeding      NEW MEDICATIONS STARTED DURING THIS VISIT:  New Prescriptions   No medications on file     Note:  This document was prepared using Dragon voice recognition software and may include unintentional dictation errors.    Merlyn Lot, MD 09/29/21 1530

## 2021-09-29 NOTE — ED Provider Notes (Signed)
Patient signed out to me at 3 PM by Dr. Quentin Cornwall.  65 year old female who presents with 1 day of vaginal bleeding.  Passed a large clot and there is active bleeding on vaginal exam.  She is pending a pelvic ultrasound and labs.  Patient's hemoglobin is 10.1 which is similar to prior.  Her ultrasound demonstrates a thickened endometrial stripe and multiple fibroids.  Discussed the findings with the patient and stressed the importance of GYN follow-up for evaluation for endometrial cancer.  We discussed return for bleeding soaking through a pad greater than every 2 hours.  Patient understood and was in agreement with the plan for discharge.   Rada Hay, MD 09/29/21 2106

## 2021-09-29 NOTE — Discharge Instructions (Addendum)
Your blood counts were overall reassuring. Please continue to take the iron supplements. Your ultrasound showed uterine fibroids, but also a thickening of the lining of your uterus. It is very important the you follow-up with a Gynecologist, as you will need a biopsy of your uterine lining to make sure you do not have cancer.

## 2021-09-29 NOTE — ED Notes (Signed)
Follow up obgyn info provided all questions answered

## 2021-09-29 NOTE — ED Notes (Signed)
This RN and Rubin Payor attempted twice on IV insertion. Unsuccessful attempts.

## 2021-09-29 NOTE — ED Notes (Signed)
Patient transported to Ultrasound 

## 2021-09-29 NOTE — ED Notes (Signed)
Pt presents to the ED for bleeding and lightheadedness. Pt states since 0300 this AM, pt has saturated 2 maxi pads, chux pads, and her pants. Pt is unsure of it is vaginal or rectal. Pt states that she does have to take iron supplements. Pt states when she gets up she feels lightheaded. Denies SOB. Denies blood thinner. Pt is A&Ox4 and NAD.

## 2021-10-02 ENCOUNTER — Ambulatory Visit (INDEPENDENT_AMBULATORY_CARE_PROVIDER_SITE_OTHER): Payer: PPO | Admitting: Obstetrics and Gynecology

## 2021-10-02 ENCOUNTER — Other Ambulatory Visit: Payer: Self-pay

## 2021-10-02 ENCOUNTER — Encounter: Payer: Self-pay | Admitting: Obstetrics and Gynecology

## 2021-10-02 ENCOUNTER — Other Ambulatory Visit (HOSPITAL_COMMUNITY)
Admission: RE | Admit: 2021-10-02 | Discharge: 2021-10-02 | Disposition: A | Discharge status: Home / Self Care | Payer: PPO | Source: Ambulatory Visit | Attending: Obstetrics and Gynecology | Admitting: Obstetrics and Gynecology

## 2021-10-02 VITALS — BP 158/87 | HR 97 | Ht 63.0 in | Wt 265.0 lb

## 2021-10-02 DIAGNOSIS — N950 Postmenopausal bleeding: Secondary | ICD-10-CM | POA: Insufficient documentation

## 2021-10-02 DIAGNOSIS — R9389 Abnormal findings on diagnostic imaging of other specified body structures: Secondary | ICD-10-CM | POA: Diagnosis not present

## 2021-10-02 DIAGNOSIS — N8500 Endometrial hyperplasia, unspecified: Secondary | ICD-10-CM | POA: Diagnosis not present

## 2021-10-02 NOTE — Addendum Note (Signed)
Addended by: Chilton Greathouse on: 10/02/2021 03:59 PM   Modules accepted: Orders

## 2021-10-02 NOTE — Progress Notes (Signed)
HPI:      [name] is a 65 y.o. G1P1 who LMP was No LMP recorded. Patient is postmenopausal.  Subjective:   She presents today because she was seen in the emergency department for some postmenopausal bleeding.  An ultrasound at that time revealed submucosal uterine fibroids as well as an endometrium greater than 5 mm.  It was noted to be 9 mm.  Patient reports that she occasionally continues to have spotting but no further significant bleeding. She does report that she had an abnormal Pap smear more than 25 years ago.    Hx: The following portions of the patient's history were reviewed and updated as appropriate:             She  has a past medical history of Anemia, Arthritis, GERD (gastroesophageal reflux disease), Hypertension, Lymphedema of both lower extremities, and Venous ulcer of right leg ([place]). She does not have any pertinent problems on file. She  has a past surgical history that includes DG RIGHT TIBIA AND FIBULA ([place] HX); Leg Surgery (Right); Colonoscopy with propofol (N/A, 10/28/2016); and Total knee arthroplasty (Left, 06/05/2021). Her family history includes COPD in her mother; Diabetes in her mother; Healthy in her sister and son; Heart disease in her mother. She  reports that she has never smoked. She has never used smokeless tobacco. She reports that she does not drink alcohol and does not use drugs. She has a current medication list which includes the following prescription(s): acetaminophen, calcium carbonate, docusate sodium, ferrous [id number] c-folic acid, folic acid, furosemide, halobetasol, hydrochlorothiazide, methocarbamol, methotrexate, metoprolol tartrate, olmesartan, potassium chloride, tramadol, triamcinolone ointment, vitamin b-12, gabapentin, and hydrocodone-acetaminophen. She is allergic to latex.       Review of Systems:  Review of Systems  Constitutional: Denied constitutional symptoms, night sweats, recent illness, fatigue, fever,  insomnia and weight loss.  Eyes: Denied eye symptoms, eye pain, photophobia, vision change and visual disturbance.  Ears/Nose/Throat/Neck: Denied ear, nose, throat or neck symptoms, hearing loss, nasal discharge, sinus congestion and sore throat.  Cardiovascular: Denied cardiovascular symptoms, arrhythmia, chest pain/pressure, edema, exercise intolerance, orthopnea and palpitations.  Respiratory: Denied pulmonary symptoms, asthma, pleuritic pain, productive sputum, cough, dyspnea and wheezing.  Gastrointestinal: Denied, gastro-esophageal reflux, melena, nausea and vomiting.  Genitourinary: See HPI for additional information.  Musculoskeletal: Denied musculoskeletal symptoms, stiffness, swelling, muscle weakness and myalgia.  Dermatologic: Denied dermatology symptoms, rash and scar.  Neurologic: Denied neurology symptoms, dizziness, headache, neck pain and syncope.  Psychiatric: Denied psychiatric symptoms, anxiety and depression.  Endocrine: Denied endocrine symptoms including hot flashes and night sweats.   Meds:   Current Outpatient Medications on File Prior to Visit  Medication Sig Dispense Refill   acetaminophen (TYLENOL) 500 MG tablet Take 500 mg by mouth every 6 (six) hours as needed for moderate pain.     calcium carbonate (TUMS - DOSED IN MG ELEMENTAL CALCIUM) 500 MG chewable tablet Chew 1 tablet by mouth daily as needed for indigestion or heartburn.     docusate sodium (COLACE) 100 MG capsule Take 1 capsule (100 mg total) by mouth 2 (two) times daily. 10 capsule 0   ferrous [id number] C-folic acid (TRINSICON / FOLTRIN) capsule Take 1 capsule by mouth 2 (two) times daily.     folic acid (FOLVITE) 1 MG tablet Take 1 mg by mouth See admin instructions. Take 1 mg by mouth daily except for Sunday     furosemide (LASIX) 20 MG tablet Take 1 tablet (20 mg total) by mouth daily.  90 tablet 1   halobetasol (ULTRAVATE) 0.05 % ointment Apply 1 application topically 2 (two) times daily as  needed (irritation).     hydrochlorothiazide (HYDRODIURIL) 25 MG tablet Take 1 tablet (25 mg total) by mouth daily. 90 tablet 1   methocarbamol (ROBAXIN) 500 MG tablet Take 1 tablet (500 mg total) by mouth every 6 (six) hours as needed for muscle spasms. 30 tablet 0   methotrexate 2.5 MG tablet Take 15 mg by mouth every Sunday.     metoprolol tartrate (LOPRESSOR) 25 MG tablet Take 1 tablet (25 mg total) by mouth daily. (Patient taking differently: Take 25 mg by mouth every morning.) 90 tablet 1   olmesartan (BENICAR) 20 MG tablet Take 1 tablet (20 mg total) by mouth daily. (Patient taking differently: Take 20 mg by mouth every morning.) 90 tablet 1   potassium chloride (KLOR-CON) 10 MEQ tablet TAKE 1 TABLET BY MOUTH ONCE DAILY. TAKE WITH FUROSEMIDE. (Patient taking differently: Take 10 mEq by mouth daily.) 90 tablet 1   traMADol (ULTRAM) 50 MG tablet Take 1 tablet (50 mg total) by mouth every 6 (six) hours as needed. 30 tablet 0   triamcinolone ointment (KENALOG) 0.1 % Apply 1 application topically daily as needed (eczema flares).     vitamin B-12 (CYANOCOBALAMIN) 1000 MCG tablet Take 1,000 mcg by mouth daily.     gabapentin (NEURONTIN) 300 MG capsule Take 300 mg by mouth 3 (three) times daily.     HYDROcodone-acetaminophen (NORCO/VICODIN) 5-325 MG tablet Take 1-2 tablets by mouth every 4 (four) hours as needed for moderate pain (pain score 4-6). (Patient not taking: Reported on 10/02/2021) 30 tablet 0   No current facility-administered medications on file prior to visit.      Objective:     Vitals:   10/02/21 1317  BP: (!) 158/87  Pulse: 97   Filed Weights   10/02/21 1317  Weight: 265 lb (120.2 kg)              Physical examination   Pelvic:   Vulva: Normal appearance.  No lesions.  Vagina: No lesions or abnormalities noted.  Support: Normal pelvic support.  Urethra No masses tenderness or scarring.  Meatus Normal size without lesions or prolapse.  Cervix: Normal appearance.   No lesions.  Anus: Normal exam.  No lesions.  Perineum: Normal exam.  No lesions.        Bimanual   Uterus: Normal size.  Non-tender.  Mobile.  AV.  Adnexae: No masses.  Non-tender to palpation.  Cul-de-sac: Negative for abnormality.   Endometrial Biopsy After discussion with the patient regarding her abnormal uterine bleeding I recommended that she proceed with an endometrial biopsy for further diagnosis. The risks, benefits, alternatives, and indications for an endometrial biopsy were discussed with the patient in detail. She understood the risks including infection, bleeding, cervical laceration and uterine perforation.  Verbal consent was obtained.   PROCEDURE NOTE:  Vacurette endometrial biopsy was performed using aseptic technique with iodine preparation.  The uterus was sounded to a length of 7 cm.  Adequate sampling was obtained with minimal blood loss.  The patient tolerated the procedure well.  Scant amount of tissue was obtained.  Disposition will be pending pathology           Assessment:    G1P1 Patient Active Problem List   Diagnosis Date Noted   S/P TKR (total knee replacement) using cement, left 06/05/2021   Ulcer of right leg, limited to breakdown of skin ([place])  05/11/2021   Chronic pain of both knees 11/15/2020   History of abnormal mammogram 04/06/2020   Retaining fluid 04/06/2020   Decreased hemoglobin 04/06/2020   AKI (acute kidney injury) ([place]) 04/06/2020   History of sepsis 03/01/2020   Hypokalemia 03/01/2020   History of rhabdomyolysis 03/01/2020   Rheumatoid arthritis ([place]) 03/01/2020   Peripheral neuropathy 03/01/2020   Hx of colonic polyps    Polyp of sigmoid colon    Morbidly obese ([place]) 03/06/2016   Chronic eczema 01/17/2015   Cough due to ACE inhibitor 01/17/2015   Essential (primary) hypertension 01/17/2015   Secondary osteoarthritis of right ankle 01/17/2015   Acquired lymphedema of leg 11/25/2014   Infection and inflammatory reaction due to  internal orthopedic device, implant, and graft ([place]) 05/01/2010   Pseudoarthrosis of cervical spine ([place]) 01/12/2010   Hyperthyroidism, subclinical 01/12/2010   Tachycardia 01/12/2010   Nonunion of fracture 01/12/2010     1. Postmenopausal bleeding   2. Thickened endometrium     Likely secondary to uterine fibroids, however a slightly thickened endometrial lining may indicate hyperplasia.  Possible polyp present based on ultrasound.   Plan:            1.  Endometrial biopsy performed.  I will contact the patient with results and we will discuss further management when they return. Orders No orders of the defined types were placed in this encounter.   No orders of the defined types were placed in this encounter.     F/U  Return for We will contact her with any abnormal test results. I spent 31 minutes involved in the care of this patient preparing to see the patient by obtaining and reviewing her medical history (including labs, imaging tests and prior procedures), documenting clinical information in the electronic health record (EHR), counseling and coordinating care plans, writing and sending prescriptions, ordering tests or procedures and in direct communicating with the patient and medical staff discussing pertinent items from her history and physical exam.  Finis Bud, M.D. 10/02/2021 2:18 PM

## 2021-10-05 LAB — SURGICAL PATHOLOGY

## 2021-10-06 NOTE — Progress Notes (Signed)
Based on this bx result, there seems to be no issues regarding the lining of the uterus. If you continue to have bleeding issues we can meet to discuss management.

## 2021-10-11 ENCOUNTER — Other Ambulatory Visit: Payer: Self-pay | Admitting: Orthopedic Surgery

## 2021-10-15 ENCOUNTER — Encounter: Payer: PPO | Admitting: Obstetrics and Gynecology

## 2021-10-19 ENCOUNTER — Other Ambulatory Visit
Admission: RE | Admit: 2021-10-19 | Discharge: 2021-10-19 | Disposition: A | Discharge status: Home / Self Care | Payer: PPO | Source: Ambulatory Visit | Attending: Orthopedic Surgery | Admitting: Orthopedic Surgery

## 2021-10-19 ENCOUNTER — Other Ambulatory Visit: Payer: Self-pay

## 2021-10-19 DIAGNOSIS — Z01818 Encounter for other preprocedural examination: Secondary | ICD-10-CM | POA: Diagnosis not present

## 2021-10-19 LAB — URINALYSIS, ROUTINE W REFLEX MICROSCOPIC
Bilirubin Urine: NEGATIVE
Glucose, UA: NEGATIVE mg/dL
Ketones, ur: NEGATIVE mg/dL
Nitrite: NEGATIVE
Protein, ur: NEGATIVE mg/dL
Specific Gravity, Urine: 1.015 (ref 1.005–1.030)
pH: 5.5 (ref 5.0–8.0)

## 2021-10-19 LAB — COMPREHENSIVE METABOLIC PANEL
ALT: 11 U/L (ref 0–44)
AST: 20 U/L (ref 15–41)
Albumin: 3.9 g/dL (ref 3.5–5.0)
Alkaline Phosphatase: 85 U/L (ref 38–126)
Anion gap: 12 (ref 5–15)
BUN: 25 mg/dL — ABNORMAL HIGH (ref 8–23)
CO2: 23 mmol/L (ref 22–32)
Calcium: 9.2 mg/dL (ref 8.9–10.3)
Chloride: 101 mmol/L (ref 98–111)
Creatinine, Ser: 1.19 mg/dL — ABNORMAL HIGH (ref 0.44–1.00)
GFR, Estimated: 50 mL/min — ABNORMAL LOW (ref >60)
Glucose, Bld: 95 mg/dL (ref 70–99)
Potassium: 4 mmol/L (ref 3.5–5.1)
Sodium: 136 mmol/L (ref 135–145)
Total Bilirubin: 0.7 mg/dL (ref 0.3–1.2)
Total Protein: 8.2 g/dL — ABNORMAL HIGH (ref 6.5–8.1)

## 2021-10-19 LAB — SURGICAL PCR SCREEN
MRSA, PCR: NEGATIVE
Staphylococcus aureus: NEGATIVE

## 2021-10-19 NOTE — Patient Instructions (Addendum)
Your procedure is scheduled on: 10/30/2021  Report to the Registration Desk on the 1st floor of the [facility]. To find out your arrival time, please call [phone] between 1PM - 3PM on: 10/29/2021   REMEMBER: Instructions that are not followed completely may result in serious medical risk, up to and including death; or upon the discretion of your surgeon and anesthesiologist your surgery may need to be rescheduled.  Do not eat food after midnight the night before surgery.  No gum chewing, lozengers or hard candies.  You may however, drink CLEAR liquids up to 2 hours before you are scheduled to arrive for your surgery. Do not drink anything within 2 hours of your scheduled arrival time.  Clear liquids include: - water  - apple juice without pulp - gatorade (not RED, PURPLE, OR BLUE) - black coffee or tea (Do NOT add milk or creamers to the coffee or tea) Do NOT drink anything that is not on this list.    In addition, your doctor has ordered for you to drink the provided  Ensure Pre-Surgery Clear Carbohydrate Drink =provided for you  Drinking this carbohydrate drink up to two hours before surgery helps to reduce insulin resistance and improve patient outcomes. Please complete drinking 2 hours prior to scheduled arrival time.  TAKE THESE MEDICATIONS THE MORNING OF SURGERY WITH A SIP OF WATER: Gabapentin Metoprolol Potassium chloride Calcium carbonate- if needed Tramadol 7.  Tylenol as as needed   One week prior to surgery: Stop Anti-inflammatories (NSAIDS) such as Advil, Aleve, Ibuprofen, Motrin, Naproxen, Naprosyn and Aspirin based products such as Excedrin, Goodys Powder, BC Powder. Stop ANY OVER THE COUNTER supplements until after surgery like; ferrus sulfate, folic acid, M-42,AST D  You may however, continue to take Tylenol if needed for pain up until the day of surgery.  No Alcohol for 24 hours before or after surgery.  No Smoking including e-cigarettes for 24  hours prior to surgery.  No chewable tobacco products for at least 6 hours prior to surgery.  No nicotine patches on the day of surgery.  Do not use any "recreational" drugs for at least a week prior to your surgery.  Please be advised that the combination of cocaine and anesthesia may have negative outcomes, up to and including death. If you test positive for cocaine, your surgery will be cancelled.  On the morning of surgery brush your teeth with toothpaste and water, you may rinse your mouth with mouthwash if you wish. Do not swallow any toothpaste or mouthwash.  Use CHG Soap or wipes as directed on instruction sheet.  Do not wear jewelry, make-up, hairpins, clips or nail polish.  Do not wear lotions, powders, or perfumes.   Do not shave body from the neck down 48 hours prior to surgery just in case you cut yourself which could leave a site for infection.  Also, freshly shaved skin may become irritated if using the CHG soap.  Contact lenses, hearing aids and dentures may not be worn into surgery.  Do not bring valuables to the hospital. [facility] is not responsible for any missing/lost belongings or valuables.   Notify your doctor if there is any change in your medical condition (cold, fever, infection).  Wear comfortable clothing (specific to your surgery type) to the hospital.  After surgery, you can help prevent lung complications by doing breathing exercises.  Take deep breaths and cough every 1-2 hours. Your doctor may order a device called an Incentive Spirometer to help  you take deep breaths.  If you are being admitted to the hospital overnight, leave your suitcase in the car. After surgery it may be brought to your room.  If you are being discharged the day of surgery, you will not be allowed to drive home. You will need a responsible adult (18 years or older) to drive you home and stay with you that night.   If you are taking public transportation, you will need to  have a responsible adult (18 years or older) with you. Please confirm with your physician that it is acceptable to use public transportation.   Please call the [facility] Dept. at [phone] if you have any questions about these instructions.  Surgery Visitation Policy:  Patients undergoing a surgery or procedure may have one family member or support person with them as long as that person is not COVID-19 positive or experiencing its symptoms.  That person may remain in the waiting area during the procedure and may rotate out with other people.  Inpatient Visitation:    Visiting hours are 7 a.m. to 8 p.m. Up to two visitors ages 16+ are allowed at one time in a patient room. The visitors may rotate out with other people during the day. Visitors must check out when they leave, or other visitors will not be allowed. One designated support person may remain overnight. The visitor must pass COVID-19 screenings, use hand sanitizer when entering and exiting the patient's room and wear a mask at all times, including in the patient's room. Patients must also wear a mask when staff or their visitor are in the room. Masking is required regardless of vaccination status.

## 2021-10-21 DIAGNOSIS — Z01812 Encounter for preprocedural laboratory examination: Secondary | ICD-10-CM

## 2021-10-21 DIAGNOSIS — N390 Urinary tract infection, site not specified: Secondary | ICD-10-CM

## 2021-10-21 LAB — URINE CULTURE: Culture: >=100000 — AB

## 2021-10-21 NOTE — Progress Notes (Signed)
  [facility] Perioperative Services: Pre-Admission/Anesthesia Testing  Abnormal Lab Notification and Treatment Plan of Care   Date: 10/22/21  Name: Autumn Burton MRN:   [id number]  Re: Abnormal labs noted during PAT appointment   Notified:  Provider Name Provider Role Notification Mode  Hessie Knows, MD Orthopedic Surgeon Routed and/or faxed via [place] and Notes:  ABNORMAL LAB VALUE(S): Lab Results  Component Value Date   COLORURINE STRAW (A) 10/19/2021   APPEARANCEUR CLEAR 10/19/2021   LABSPEC 1.015 10/19/2021   PHURINE 5.5 10/19/2021   GLUCOSEU NEGATIVE 10/19/2021   HGBUR SMALL (A) 10/19/2021   BILIRUBINUR NEGATIVE 10/19/2021   KETONESUR NEGATIVE 10/19/2021   PROTEINUR NEGATIVE 10/19/2021   UROBILINOGEN 0.2 09/04/2016   NITRITE NEGATIVE 10/19/2021   LEUKOCYTESUR MODERATE (A) 10/19/2021   EPIU 0-5 10/19/2021   WBCU 21-50 10/19/2021   RBCU 0-5 10/19/2021   BACTERIA FEW (A) 10/19/2021   CULT >=100,000 COLONIES/mL ESCHERICHIA COLI (A) 10/19/2021   Patient is scheduled for an elective RIGHT TOTAL KNEE ARTHROPLASTY on 10/30/2021.    UA performed in PAT consistent with infection.  CBC obtained on 10/19/2021 hemolyzed; unable to determine WBC count.  Renal function: Estimated Creatinine Clearance: 58.5 mL/min (A) (by C-G formula based on SCr of 1.19 mg/dL (H)). Urine C&S added to assess for pathogenically significant growth.  Impression and Plan:  Autumn Burton with a UA that was (+) for infection. Reflex culture sent that subsequently grew out pathogenically significant Escherichia coli isolate. Contacted patient to discuss. Patient reporting that she is experiencing urinary frequency and what she describes as a "pressure" associated with voiding. No fevers, nausea, or significant abdominal/back pain.   Patient with surgery scheduled soon. In efforts to avoid delaying patient's procedure, or have her experience any potentially  significant perioperative complications related to the aforementioned, I would like to proceed with empiric treatment for urinary tract infection.  Allergies reviewed. Culture report also reviewed to ensure culture appropriate coverage is being provided. Will treat with a 5 day course of nitrofurantoin. Patient encouraged to complete the entire course of antibiotics even if she begins to feel better.    Meds ordered this encounter  Medications   nitrofurantoin, macrocrystal-monohydrate, (MACROBID) 100 MG capsule    Sig: Take 1 capsule (100 mg total) by mouth 2 (two) times daily for 5 days. Increase WATER intake while taking this medication.    Dispense:  10 capsule    Refill:  0   Patient encouraged to increase her fluid intake as much as possible. Discussed that water is always best to flush the urinary tract. She was advised to avoid caffeine containing fluids until her infections clears, as caffeine can cause her to experience painful bladder spasms.   May use Tylenol as needed for pain/fever.   Patient instructed to call surgeon's office or PAT with any questions or concerns related to the above outlined course of treatment. Additionally, she was instructed to call if she feels like she is getting worse overall while on treatment. Results and treatment plan of care forwarded to primary attending surgeon to make them aware.   This is a Community education officer; no formal response is required.  Honor Loh, MSN, APRN, FNP-C, CEN [facility]  Peri-operative Services Nurse Practitioner Phone: [phone] Fax: [phone] 10/22/21 10:00 AM

## 2021-10-22 MED ORDER — NITROFURANTOIN MONOHYD MACRO 100 MG PO CAPS
100.0000 mg | ORAL_CAPSULE | Freq: Two times a day (BID) | ORAL | 0 refills | Status: AC
Start: 2021-10-22 — End: 2021-10-27

## 2021-10-25 ENCOUNTER — Other Ambulatory Visit: Admission: RE | Admit: 2021-10-25 | Payer: PPO | Source: Ambulatory Visit

## 2021-10-26 ENCOUNTER — Other Ambulatory Visit
Admission: RE | Admit: 2021-10-26 | Discharge: 2021-10-26 | Disposition: A | Discharge status: Home / Self Care | Payer: PPO | Source: Ambulatory Visit | Attending: Orthopedic Surgery | Admitting: Orthopedic Surgery

## 2021-10-26 ENCOUNTER — Other Ambulatory Visit: Payer: Self-pay

## 2021-10-26 DIAGNOSIS — Z20822 Contact with and (suspected) exposure to covid-19: Secondary | ICD-10-CM | POA: Insufficient documentation

## 2021-10-26 DIAGNOSIS — Z01812 Encounter for preprocedural laboratory examination: Secondary | ICD-10-CM | POA: Diagnosis not present

## 2021-10-27 LAB — SARS CORONAVIRUS 2 (TAT 6-24 HRS): SARS Coronavirus 2: NEGATIVE

## 2021-10-30 ENCOUNTER — Inpatient Hospital Stay
Admission: RE | Admit: 2021-10-30 | Discharge: 2021-11-02 | DRG: 470 | Disposition: A | Discharge status: Skilled Nursing Facility | Payer: PPO | Attending: Orthopedic Surgery | Admitting: Orthopedic Surgery

## 2021-10-30 ENCOUNTER — Encounter: Payer: Self-pay | Admitting: Orthopedic Surgery

## 2021-10-30 ENCOUNTER — Other Ambulatory Visit: Payer: Self-pay

## 2021-10-30 ENCOUNTER — Ambulatory Visit: Payer: PPO | Admitting: Urgent Care

## 2021-10-30 ENCOUNTER — Encounter
Admission: RE | Disposition: A | Discharge status: Skilled Nursing Facility | Payer: Self-pay | Source: Home / Self Care | Attending: Orthopedic Surgery

## 2021-10-30 ENCOUNTER — Observation Stay: Payer: PPO

## 2021-10-30 DIAGNOSIS — Z91048 Other nonmedicinal substance allergy status: Secondary | ICD-10-CM | POA: Diagnosis not present

## 2021-10-30 DIAGNOSIS — K219 Gastro-esophageal reflux disease without esophagitis: Secondary | ICD-10-CM | POA: Diagnosis present

## 2021-10-30 DIAGNOSIS — G629 Polyneuropathy, unspecified: Secondary | ICD-10-CM | POA: Diagnosis present

## 2021-10-30 DIAGNOSIS — D62 Acute posthemorrhagic anemia: Secondary | ICD-10-CM | POA: Diagnosis not present

## 2021-10-30 DIAGNOSIS — M069 Rheumatoid arthritis, unspecified: Secondary | ICD-10-CM | POA: Diagnosis present

## 2021-10-30 DIAGNOSIS — R5381 Other malaise: Secondary | ICD-10-CM | POA: Diagnosis not present

## 2021-10-30 DIAGNOSIS — I10 Essential (primary) hypertension: Secondary | ICD-10-CM | POA: Diagnosis present

## 2021-10-30 DIAGNOSIS — Z91040 Latex allergy status: Secondary | ICD-10-CM

## 2021-10-30 DIAGNOSIS — R278 Other lack of coordination: Secondary | ICD-10-CM | POA: Diagnosis not present

## 2021-10-30 DIAGNOSIS — Z7982 Long term (current) use of aspirin: Secondary | ICD-10-CM

## 2021-10-30 DIAGNOSIS — Z96651 Presence of right artificial knee joint: Secondary | ICD-10-CM

## 2021-10-30 DIAGNOSIS — Z6841 Body Mass Index (BMI) 40.0 and over, adult: Secondary | ICD-10-CM

## 2021-10-30 DIAGNOSIS — Z96652 Presence of left artificial knee joint: Secondary | ICD-10-CM | POA: Diagnosis present

## 2021-10-30 DIAGNOSIS — R000 Tachycardia, unspecified: Secondary | ICD-10-CM | POA: Diagnosis not present

## 2021-10-30 DIAGNOSIS — Z79899 Other long term (current) drug therapy: Secondary | ICD-10-CM | POA: Diagnosis not present

## 2021-10-30 DIAGNOSIS — E1142 Type 2 diabetes mellitus with diabetic polyneuropathy: Secondary | ICD-10-CM | POA: Diagnosis not present

## 2021-10-30 DIAGNOSIS — Z471 Aftercare following joint replacement surgery: Secondary | ICD-10-CM | POA: Diagnosis not present

## 2021-10-30 DIAGNOSIS — Z743 Need for continuous supervision: Secondary | ICD-10-CM | POA: Diagnosis not present

## 2021-10-30 DIAGNOSIS — M6281 Muscle weakness (generalized): Secondary | ICD-10-CM | POA: Diagnosis not present

## 2021-10-30 DIAGNOSIS — M1712 Unilateral primary osteoarthritis, left knee: Secondary | ICD-10-CM | POA: Diagnosis not present

## 2021-10-30 DIAGNOSIS — Z8249 Family history of ischemic heart disease and other diseases of the circulatory system: Secondary | ICD-10-CM | POA: Diagnosis not present

## 2021-10-30 DIAGNOSIS — Z01810 Encounter for preprocedural cardiovascular examination: Secondary | ICD-10-CM | POA: Diagnosis not present

## 2021-10-30 DIAGNOSIS — G8929 Other chronic pain: Secondary | ICD-10-CM | POA: Diagnosis present

## 2021-10-30 DIAGNOSIS — Z79631 Long term (current) use of antimetabolite agent: Secondary | ICD-10-CM | POA: Diagnosis not present

## 2021-10-30 DIAGNOSIS — R2689 Other abnormalities of gait and mobility: Secondary | ICD-10-CM | POA: Diagnosis not present

## 2021-10-30 DIAGNOSIS — I890 Lymphedema, not elsewhere classified: Secondary | ICD-10-CM | POA: Diagnosis not present

## 2021-10-30 DIAGNOSIS — M1991 Primary osteoarthritis, unspecified site: Secondary | ICD-10-CM | POA: Diagnosis not present

## 2021-10-30 DIAGNOSIS — R69 Illness, unspecified: Secondary | ICD-10-CM | POA: Diagnosis not present

## 2021-10-30 DIAGNOSIS — R262 Difficulty in walking, not elsewhere classified: Secondary | ICD-10-CM | POA: Diagnosis not present

## 2021-10-30 DIAGNOSIS — G8918 Other acute postprocedural pain: Secondary | ICD-10-CM

## 2021-10-30 DIAGNOSIS — M1711 Unilateral primary osteoarthritis, right knee: Principal | ICD-10-CM | POA: Diagnosis present

## 2021-10-30 HISTORY — PX: TOTAL KNEE ARTHROPLASTY: SHX125

## 2021-10-30 LAB — CBC WITH DIFFERENTIAL/PLATELET
Abs Immature Granulocytes: 0.03 10*3/uL (ref 0.00–0.07)
Basophils Absolute: 0 10*3/uL (ref 0.0–0.1)
Basophils Relative: 0 %
Eosinophils Absolute: 0.2 10*3/uL (ref 0.0–0.5)
Eosinophils Relative: 3 %
HCT: 31.8 % — ABNORMAL LOW (ref 36.0–46.0)
Hemoglobin: 10.1 g/dL — ABNORMAL LOW (ref 12.0–15.0)
Immature Granulocytes: 0 %
Lymphocytes Relative: 21 %
Lymphs Abs: 1.5 10*3/uL (ref 0.7–4.0)
MCH: 31.1 pg (ref 26.0–34.0)
MCHC: 31.8 g/dL (ref 30.0–36.0)
MCV: 97.8 fL (ref 80.0–100.0)
Monocytes Absolute: 0.5 10*3/uL (ref 0.1–1.0)
Monocytes Relative: 8 %
Neutro Abs: 4.8 10*3/uL (ref 1.7–7.7)
Neutrophils Relative %: 68 %
Platelets: 289 10*3/uL (ref 150–400)
RBC: 3.25 MIL/uL — ABNORMAL LOW (ref 3.87–5.11)
RDW: 15.8 % — ABNORMAL HIGH (ref 11.5–15.5)
WBC: 7.1 10*3/uL (ref 4.0–10.5)
nRBC: 0 % (ref 0.0–0.2)

## 2021-10-30 LAB — TYPE AND SCREEN
ABO/RH(D): AB NEG
Antibody Screen: NEGATIVE

## 2021-10-30 LAB — CBC
HCT: 29.7 % — ABNORMAL LOW (ref 36.0–46.0)
Hemoglobin: 9.5 g/dL — ABNORMAL LOW (ref 12.0–15.0)
MCH: 31.3 pg (ref 26.0–34.0)
MCHC: 32 g/dL (ref 30.0–36.0)
MCV: 97.7 fL (ref 80.0–100.0)
Platelets: 271 10*3/uL (ref 150–400)
RBC: 3.04 MIL/uL — ABNORMAL LOW (ref 3.87–5.11)
RDW: 15.6 % — ABNORMAL HIGH (ref 11.5–15.5)
WBC: 6.9 10*3/uL (ref 4.0–10.5)
nRBC: 0 % (ref 0.0–0.2)

## 2021-10-30 LAB — CREATININE, SERUM
Creatinine, Ser: 1.21 mg/dL — ABNORMAL HIGH (ref 0.44–1.00)
GFR, Estimated: 49 mL/min — ABNORMAL LOW (ref >60)

## 2021-10-30 SURGERY — ARTHROPLASTY, KNEE, TOTAL
Anesthesia: General | Site: Knee | Laterality: Right

## 2021-10-30 MED ORDER — FAMOTIDINE 20 MG PO TABS
ORAL_TABLET | ORAL | Status: AC
  Administered 2021-10-30: 08:00:00 20 mg via ORAL
  Filled 2021-10-30: qty 1

## 2021-10-30 MED ORDER — FLEET ENEMA 7-19 GM/118ML RE ENEM: 1.0000 | ENEMA | Freq: Once | RECTAL | Status: DC | PRN

## 2021-10-30 MED ORDER — ZOLPIDEM TARTRATE 5 MG PO TABS: 5.0000 mg | ORAL_TABLET | Freq: Every evening | ORAL | Status: DC | PRN

## 2021-10-30 MED ORDER — DOCUSATE SODIUM 100 MG PO CAPS
ORAL_CAPSULE | ORAL | Status: AC
  Administered 2021-10-30: 21:00:00 100 mg
  Filled 2021-10-30: qty 1

## 2021-10-30 MED ORDER — DEXTROSE 5 % IV SOLN
INTRAVENOUS | Status: DC | PRN
  Administered 2021-10-30: 09:00:00 3 g via INTRAVENOUS

## 2021-10-30 MED ORDER — POLYETHYLENE GLYCOL 3350 17 G PO PACK
17.0000 g | PACK | Freq: Every day | ORAL | Status: DC | PRN
  Filled 2021-10-30: qty 1

## 2021-10-30 MED ORDER — METHOCARBAMOL 1000 MG/10ML IJ SOLN
500.0000 mg | Freq: Four times a day (QID) | INTRAVENOUS | Status: DC | PRN
  Filled 2021-10-30: qty 5

## 2021-10-30 MED ORDER — PROPOFOL 1000 MG/100ML IV EMUL
INTRAVENOUS | Status: AC
  Filled 2021-10-30: qty 100

## 2021-10-30 MED ORDER — HYDROCODONE-ACETAMINOPHEN 5-325 MG PO TABS
ORAL_TABLET | ORAL | Status: AC
  Filled 2021-10-30: qty 2

## 2021-10-30 MED ORDER — ONDANSETRON HCL 4 MG/2ML IJ SOLN
INTRAMUSCULAR | Status: AC
  Filled 2021-10-30: qty 2

## 2021-10-30 MED ORDER — METOPROLOL TARTRATE 25 MG PO TABS
25.0000 mg | ORAL_TABLET | ORAL | Status: DC
  Administered 2021-11-01 – 2021-11-02 (×2): 25 mg via ORAL

## 2021-10-30 MED ORDER — VANCOMYCIN HCL 1000 MG IV SOLR
INTRAVENOUS | Status: DC | PRN
  Administered 2021-10-30: 1 g

## 2021-10-30 MED ORDER — FAMOTIDINE 20 MG PO TABS: 20.0000 mg | ORAL_TABLET | Freq: Once | ORAL | Status: AC

## 2021-10-30 MED ORDER — METOCLOPRAMIDE HCL 5 MG PO TABS
5.0000 mg | ORAL_TABLET | Freq: Three times a day (TID) | ORAL | Status: DC | PRN
  Filled 2021-10-30: qty 2

## 2021-10-30 MED ORDER — FENTANYL CITRATE (PF) 100 MCG/2ML IJ SOLN: 25.0000 ug | INTRAMUSCULAR | Status: DC | PRN

## 2021-10-30 MED ORDER — CEFAZOLIN SODIUM-DEXTROSE 2-4 GM/100ML-% IV SOLN
2.0000 g | Freq: Four times a day (QID) | INTRAVENOUS | Status: AC
  Administered 2021-10-30: 21:00:00 2 g via INTRAVENOUS
  Filled 2021-10-30 (×2): qty 100

## 2021-10-30 MED ORDER — FERROUS SULFATE 325 (65 FE) MG PO TABS
325.0000 mg | ORAL_TABLET | Freq: Every day | ORAL | Status: DC
  Administered 2021-10-31 – 2021-11-02 (×3): 325 mg via ORAL
  Filled 2021-10-30 (×5): qty 1

## 2021-10-30 MED ORDER — DIPHENHYDRAMINE HCL 12.5 MG/5ML PO ELIX
12.5000 mg | ORAL_SOLUTION | ORAL | Status: DC | PRN
  Filled 2021-10-30: qty 10

## 2021-10-30 MED ORDER — CEFAZOLIN IN SODIUM CHLORIDE 3-0.9 GM/100ML-% IV SOLN
3.0000 g | INTRAVENOUS | Status: AC
  Filled 2021-10-30 (×2): qty 100

## 2021-10-30 MED ORDER — MIDAZOLAM HCL 2 MG/2ML IJ SOLN
INTRAMUSCULAR | Status: AC
  Filled 2021-10-30: qty 2

## 2021-10-30 MED ORDER — MEPERIDINE HCL 25 MG/ML IJ SOLN: 12.5000 mg | Freq: Once | INTRAMUSCULAR | Status: AC

## 2021-10-30 MED ORDER — LACTATED RINGERS IV SOLN: INTRAVENOUS | Status: DC

## 2021-10-30 MED ORDER — MEPERIDINE HCL 25 MG/ML IJ SOLN
INTRAMUSCULAR | Status: AC
  Administered 2021-10-30: 15:00:00 12.5 mg via INTRAVENOUS
  Filled 2021-10-30: qty 1

## 2021-10-30 MED ORDER — PROPOFOL 500 MG/50ML IV EMUL
INTRAVENOUS | Status: DC | PRN
  Administered 2021-10-30: 200 ug/kg/min via INTRAVENOUS

## 2021-10-30 MED ORDER — NEOMYCIN-POLYMYXIN B GU 40-200000 IR SOLN
Status: AC
  Filled 2021-10-30: qty 20

## 2021-10-30 MED ORDER — POTASSIUM CHLORIDE ER 10 MEQ PO TBCR
10.0000 meq | EXTENDED_RELEASE_TABLET | Freq: Every day | ORAL | Status: DC
  Administered 2021-11-01: 17:00:00 10 meq via ORAL
  Filled 2021-10-30 (×3): qty 1

## 2021-10-30 MED ORDER — IRBESARTAN 150 MG PO TABS
150.0000 mg | ORAL_TABLET | Freq: Every day | ORAL | Status: DC
  Administered 2021-10-31 – 2021-11-02 (×3): 150 mg via ORAL
  Filled 2021-10-30 (×3): qty 1

## 2021-10-30 MED ORDER — SODIUM CHLORIDE 0.9 % IV SOLN: INTRAVENOUS | Status: DC

## 2021-10-30 MED ORDER — FUROSEMIDE 20 MG PO TABS
20.0000 mg | ORAL_TABLET | Freq: Every day | ORAL | Status: DC
  Administered 2021-10-30 – 2021-11-02 (×4): 20 mg via ORAL
  Filled 2021-10-30 (×4): qty 1

## 2021-10-30 MED ORDER — BUPIVACAINE LIPOSOME 1.3 % IJ SUSP
INTRAMUSCULAR | Status: AC
  Filled 2021-10-30: qty 20

## 2021-10-30 MED ORDER — PRONTOSAN WOUND IRRIGATION OPTIME
TOPICAL | Status: DC | PRN
  Administered 2021-10-30: 1

## 2021-10-30 MED ORDER — MIDAZOLAM HCL 5 MG/5ML IJ SOLN
INTRAMUSCULAR | Status: DC | PRN
  Administered 2021-10-30: 2 mg via INTRAVENOUS

## 2021-10-30 MED ORDER — HYDROCODONE-ACETAMINOPHEN 5-325 MG PO TABS
1.0000 | ORAL_TABLET | ORAL | Status: DC | PRN
  Administered 2021-10-30 – 2021-11-01 (×3): 2 via ORAL

## 2021-10-30 MED ORDER — CEFAZOLIN SODIUM-DEXTROSE 2-4 GM/100ML-% IV SOLN
INTRAVENOUS | Status: AC
  Administered 2021-10-30: 16:00:00 2 g via INTRAVENOUS
  Filled 2021-10-30: qty 100

## 2021-10-30 MED ORDER — ORAL CARE MOUTH RINSE: 15.0000 mL | Freq: Once | OROMUCOSAL | Status: AC

## 2021-10-30 MED ORDER — BISACODYL 10 MG RE SUPP
10.0000 mg | Freq: Every day | RECTAL | Status: DC | PRN
  Filled 2021-10-30: qty 1

## 2021-10-30 MED ORDER — MORPHINE SULFATE (PF) 4 MG/ML IV SOLN
0.5000 mg | INTRAVENOUS | Status: DC | PRN
  Administered 2021-10-30: 15:00:00 1 mg via INTRAVENOUS

## 2021-10-30 MED ORDER — GABAPENTIN 300 MG PO CAPS
300.0000 mg | ORAL_CAPSULE | Freq: Three times a day (TID) | ORAL | Status: DC
  Administered 2021-10-30 – 2021-11-02 (×7): 300 mg via ORAL

## 2021-10-30 MED ORDER — ONDANSETRON HCL 4 MG PO TABS
4.0000 mg | ORAL_TABLET | Freq: Four times a day (QID) | ORAL | Status: DC | PRN
  Filled 2021-10-30: qty 1

## 2021-10-30 MED ORDER — METOCLOPRAMIDE HCL 5 MG/ML IJ SOLN
5.0000 mg | Freq: Three times a day (TID) | INTRAMUSCULAR | Status: DC | PRN
  Administered 2021-10-31: 14:00:00 10 mg via INTRAVENOUS

## 2021-10-30 MED ORDER — DOCUSATE SODIUM 100 MG PO CAPS
100.0000 mg | ORAL_CAPSULE | Freq: Two times a day (BID) | ORAL | Status: DC
  Administered 2021-10-30 – 2021-11-02 (×5): 100 mg via ORAL
  Filled 2021-10-30: qty 1

## 2021-10-30 MED ORDER — BUPIVACAINE-EPINEPHRINE (PF) 0.25% -1:200000 IJ SOLN
INTRAMUSCULAR | Status: AC
  Filled 2021-10-30: qty 30

## 2021-10-30 MED ORDER — PHENOL 1.4 % MT LIQD
1.0000 | OROMUCOSAL | Status: DC | PRN
  Filled 2021-10-30: qty 177

## 2021-10-30 MED ORDER — PHENYLEPHRINE HCL-NACL 20-0.9 MG/250ML-% IV SOLN
INTRAVENOUS | Status: DC | PRN
  Administered 2021-10-30: 75 ug/min via INTRAVENOUS

## 2021-10-30 MED ORDER — MORPHINE SULFATE (PF) 4 MG/ML IV SOLN
INTRAVENOUS | Status: AC
  Filled 2021-10-30: qty 1

## 2021-10-30 MED ORDER — FOLIC ACID 1 MG PO TABS
1.0000 mg | ORAL_TABLET | ORAL | Status: DC
  Administered 2021-10-31 – 2021-11-02 (×3): 1 mg via ORAL
  Filled 2021-10-30 (×3): qty 1

## 2021-10-30 MED ORDER — PHENYLEPHRINE HCL (PRESSORS) 10 MG/ML IV SOLN
INTRAVENOUS | Status: AC
  Filled 2021-10-30: qty 1

## 2021-10-30 MED ORDER — PROPOFOL 10 MG/ML IV BOLUS
INTRAVENOUS | Status: AC
  Filled 2021-10-30: qty 20

## 2021-10-30 MED ORDER — HYDROCODONE-ACETAMINOPHEN 7.5-325 MG PO TABS
1.0000 | ORAL_TABLET | ORAL | Status: DC | PRN
  Filled 2021-10-30: qty 2

## 2021-10-30 MED ORDER — MORPHINE SULFATE (PF) 10 MG/ML IV SOLN
INTRAVENOUS | Status: AC
  Filled 2021-10-30: qty 1

## 2021-10-30 MED ORDER — CEFAZOLIN SODIUM-DEXTROSE 2-4 GM/100ML-% IV SOLN
INTRAVENOUS | Status: AC
  Filled 2021-10-30: qty 100

## 2021-10-30 MED ORDER — VANCOMYCIN HCL 1000 MG IV SOLR
INTRAVENOUS | Status: AC
  Filled 2021-10-30: qty 20

## 2021-10-30 MED ORDER — SODIUM CHLORIDE FLUSH 0.9 % IV SOLN
INTRAVENOUS | Status: AC
  Filled 2021-10-30: qty 40

## 2021-10-30 MED ORDER — HYDROCHLOROTHIAZIDE 25 MG PO TABS
25.0000 mg | ORAL_TABLET | Freq: Every day | ORAL | Status: DC
  Administered 2021-10-31 – 2021-11-02 (×2): 25 mg via ORAL
  Filled 2021-10-30 (×4): qty 1

## 2021-10-30 MED ORDER — TRAMADOL HCL 50 MG PO TABS
ORAL_TABLET | ORAL | Status: AC
  Administered 2021-10-30: 16:00:00 50 mg via ORAL
  Filled 2021-10-30: qty 1

## 2021-10-30 MED ORDER — CHLORHEXIDINE GLUCONATE 0.12 % MT SOLN: 15.0000 mL | Freq: Once | OROMUCOSAL | Status: AC

## 2021-10-30 MED ORDER — TRAMADOL HCL 50 MG PO TABS
50.0000 mg | ORAL_TABLET | Freq: Four times a day (QID) | ORAL | Status: DC
  Administered 2021-10-30 – 2021-11-02 (×9): 50 mg via ORAL
  Filled 2021-10-30 (×5): qty 1

## 2021-10-30 MED ORDER — GABAPENTIN 300 MG PO CAPS
ORAL_CAPSULE | ORAL | Status: AC
  Administered 2021-10-30: 16:00:00 300 mg via ORAL
  Filled 2021-10-30: qty 1

## 2021-10-30 MED ORDER — MENTHOL 3 MG MT LOZG
1.0000 | LOZENGE | OROMUCOSAL | Status: DC | PRN
  Filled 2021-10-30: qty 9

## 2021-10-30 MED ORDER — SODIUM CHLORIDE (PF) 0.9 % IJ SOLN
INTRAMUSCULAR | Status: DC | PRN
  Administered 2021-10-30: 10:00:00 91 mL

## 2021-10-30 MED ORDER — ONDANSETRON HCL 4 MG/2ML IJ SOLN
4.0000 mg | Freq: Four times a day (QID) | INTRAMUSCULAR | Status: DC | PRN
  Administered 2021-10-30: 15:00:00 4 mg via INTRAVENOUS

## 2021-10-30 MED ORDER — METHOCARBAMOL 500 MG PO TABS
500.0000 mg | ORAL_TABLET | Freq: Four times a day (QID) | ORAL | Status: DC | PRN
  Administered 2021-10-31: 12:00:00 500 mg via ORAL

## 2021-10-30 MED ORDER — CHLORHEXIDINE GLUCONATE 0.12 % MT SOLN
OROMUCOSAL | Status: AC
  Administered 2021-10-30: 08:00:00 15 mL via OROMUCOSAL
  Filled 2021-10-30: qty 15

## 2021-10-30 MED ORDER — SODIUM CHLORIDE 0.9 % IR SOLN
Status: DC | PRN
  Administered 2021-10-30: 10:00:00 3012 mL

## 2021-10-30 MED ORDER — HALOBETASOL PROPIONATE 0.05 % EX OINT: 1.0000 "application " | TOPICAL_OINTMENT | Freq: Two times a day (BID) | CUTANEOUS | Status: DC | PRN

## 2021-10-30 MED ORDER — GABAPENTIN 300 MG PO CAPS
ORAL_CAPSULE | ORAL | Status: AC
  Filled 2021-10-30: qty 1

## 2021-10-30 MED ORDER — ONDANSETRON HCL 4 MG/2ML IJ SOLN: 4.0000 mg | Freq: Once | INTRAMUSCULAR | Status: DC | PRN

## 2021-10-30 MED ORDER — PANTOPRAZOLE SODIUM 40 MG PO TBEC
40.0000 mg | DELAYED_RELEASE_TABLET | Freq: Every day | ORAL | Status: DC
  Administered 2021-10-30 – 2021-11-02 (×4): 40 mg via ORAL
  Filled 2021-10-30 (×4): qty 1

## 2021-10-30 MED ORDER — TRAMADOL HCL 50 MG PO TABS
ORAL_TABLET | ORAL | Status: AC
  Filled 2021-10-30: qty 1

## 2021-10-30 MED ORDER — SODIUM CHLORIDE 0.9 % IR SOLN
Status: DC | PRN
  Administered 2021-10-30: 10:00:00 150 mL

## 2021-10-30 MED ORDER — BUPIVACAINE HCL (PF) 0.5 % IJ SOLN
INTRAMUSCULAR | Status: DC | PRN
Start: 2021-10-30 — End: 2021-10-30
  Administered 2021-10-30: 2.6 mL

## 2021-10-30 MED ORDER — ENOXAPARIN SODIUM 30 MG/0.3ML IJ SOSY
30.0000 mg | PREFILLED_SYRINGE | Freq: Two times a day (BID) | INTRAMUSCULAR | Status: DC
  Administered 2021-10-31 – 2021-11-02 (×5): 30 mg via SUBCUTANEOUS
  Filled 2021-10-30 (×6): qty 0.3

## 2021-10-30 MED ORDER — ACETAMINOPHEN 325 MG PO TABS: 325.0000 mg | ORAL_TABLET | Freq: Four times a day (QID) | ORAL | Status: DC | PRN

## 2021-10-30 MED ORDER — ALUM & MAG HYDROXIDE-SIMETH 200-200-20 MG/5ML PO SUSP: 30.0000 mL | ORAL | Status: DC | PRN

## 2021-10-30 SURGICAL SUPPLY — 75 items
BLADE SAGITTAL 25.0X1.19X90 (BLADE) ×2 IMPLANT
BLADE SAGITTAL 25.0X1.19X90MM (BLADE) ×1
BLADE SAW 90X13X1.19 OSCILLAT (BLADE) ×3 IMPLANT
BNDG ELASTIC 6X5.8 VLCR STR LF (GAUZE/BANDAGES/DRESSINGS) ×3 IMPLANT
CANISTER WOUND CARE 500ML ATS (WOUND CARE) ×3 IMPLANT
CEMENT HV SMART SET (Cement) ×6 IMPLANT
CEMENT PATELLA RESURF SZ1 (Cement) ×2 IMPLANT
CHLORAPREP W/TINT 26 (MISCELLANEOUS) ×6 IMPLANT
COOLER POLAR GLACIER W/PUMP (MISCELLANEOUS) ×3 IMPLANT
CUFF TOURN SGL QUICK 24 (TOURNIQUET CUFF)
CUFF TOURN SGL QUICK 34 (TOURNIQUET CUFF)
CUFF TRNQT CYL 24X4X16.5-23 (TOURNIQUET CUFF) IMPLANT
CUFF TRNQT CYL 34X4.125X (TOURNIQUET CUFF) IMPLANT
DRAPE 3/4 80X56 (DRAPES) ×6 IMPLANT
DRAPE INCISE IOBAN 66X45 STRL (DRAPES) ×2 IMPLANT
DRSG MEPILEX SACRM 8.7X9.8 (GAUZE/BANDAGES/DRESSINGS) ×3 IMPLANT
ELECT CAUTERY BLADE 6.4 (BLADE) ×3 IMPLANT
ELECT REM PT RETURN 9FT ADLT (ELECTROSURGICAL) ×3
ELECTRODE REM PT RTRN 9FT ADLT (ELECTROSURGICAL) ×1 IMPLANT
FEM COMP SZ2 RIGHT MEDACTA (Joint) ×2 IMPLANT
GAUZE 4X4 16PLY ~~LOC~~+RFID DBL (SPONGE) ×3 IMPLANT
GAUZE SPONGE 4X4 12PLY STRL (GAUZE/BANDAGES/DRESSINGS) ×3 IMPLANT
GAUZE XEROFORM 1X8 LF (GAUZE/BANDAGES/DRESSINGS) ×1 IMPLANT
GLOVE SURG ORTHO LTX SZ8 (GLOVE) ×3 IMPLANT
GLOVE SURG SYN 9.0  PF PI (GLOVE) ×2
GLOVE SURG SYN 9.0 PF PI (GLOVE) ×1 IMPLANT
GLOVE SURG UNDER LTX SZ8 (GLOVE) ×3 IMPLANT
GLOVE SURG UNDER POLY LF SZ9 (GLOVE) ×3 IMPLANT
GOWN SRG 2XL LVL 4 RGLN SLV (GOWNS) ×1 IMPLANT
GOWN STRL NON-REIN 2XL LVL4 (GOWNS) ×2
GOWN STRL REUS W/ TWL LRG LVL3 (GOWN DISPOSABLE) ×1 IMPLANT
GOWN STRL REUS W/ TWL XL LVL3 (GOWN DISPOSABLE) ×1 IMPLANT
GOWN STRL REUS W/TWL LRG LVL3 (GOWN DISPOSABLE) ×2
GOWN STRL REUS W/TWL XL LVL3 (GOWN DISPOSABLE) ×2
HOLDER FOLEY CATH W/STRAP (MISCELLANEOUS) ×3 IMPLANT
IV NS IRRIG 3000ML ARTHROMATIC (IV SOLUTION) ×3 IMPLANT
KIT PREVENA INCISION MGT20CM45 (CANNISTER) ×3 IMPLANT
KIT STIMULAN RAPID CURE 5CC (Orthopedic Implant) ×2 IMPLANT
KIT TURNOVER KIT A (KITS) ×3 IMPLANT
KNEE INSERT TIBIAL S2 02070212 (Insert) ×2 IMPLANT
MANIFOLD NEPTUNE II (INSTRUMENTS) ×3 IMPLANT
NDL SAFETY ECLIPSE 18X1.5 (NEEDLE) ×1 IMPLANT
NDL SPNL 18GX3.5 QUINCKE PK (NEEDLE) ×1 IMPLANT
NDL SPNL 20GX3.5 QUINCKE YW (NEEDLE) ×1 IMPLANT
NEEDLE HYPO 18GX1.5 SHARP (NEEDLE) ×2
NEEDLE SPNL 18GX3.5 QUINCKE PK (NEEDLE) ×3 IMPLANT
NEEDLE SPNL 20GX3.5 QUINCKE YW (NEEDLE) ×3 IMPLANT
NS IRRIG 1000ML POUR BTL (IV SOLUTION) ×3 IMPLANT
PACK TOTAL KNEE (MISCELLANEOUS) ×3 IMPLANT
PAD WRAPON POLAR KNEE (MISCELLANEOUS) ×1 IMPLANT
PAD WRAPON POLOR MULTI XL (MISCELLANEOUS) IMPLANT
PENCIL SMOKE EVACUATOR COATED (MISCELLANEOUS) ×3 IMPLANT
PULSAVAC PLUS IRRIG FAN TIP (DISPOSABLE) ×3
SCALPEL PROTECTED #10 DISP (BLADE) ×4 IMPLANT
SPONGE T-LAP 18X18 ~~LOC~~+RFID (SPONGE) ×9 IMPLANT
STAPLER SKIN PROX 35W (STAPLE) ×3 IMPLANT
STEM EXTENSION 11MMX30MM (Stem) ×2 IMPLANT
STOCKINETTE IMPERV 14X48 (MISCELLANEOUS) ×2 IMPLANT
SUCTION FRAZIER HANDLE 10FR (MISCELLANEOUS) ×2
SUCTION TUBE FRAZIER 10FR DISP (MISCELLANEOUS) ×1 IMPLANT
SUT DVC 2 QUILL PDO  T11 36X36 (SUTURE) ×2
SUT DVC 2 QUILL PDO T11 36X36 (SUTURE) ×1 IMPLANT
SUT ETHIBOND 2 V 37 (SUTURE) ×2 IMPLANT
SUT V-LOC 90 ABS DVC 3-0 CL (SUTURE) ×3 IMPLANT
SYR 20ML LL LF (SYRINGE) ×3 IMPLANT
SYR 50ML LL SCALE MARK (SYRINGE) ×6 IMPLANT
TIB TRAY SZ 2 R FIXED (Joint) ×2 IMPLANT
TIP FAN IRRIG PULSAVAC PLUS (DISPOSABLE) ×1 IMPLANT
TOWEL OR 17X26 4PK STRL BLUE (TOWEL DISPOSABLE) ×3 IMPLANT
TOWER CARTRIDGE SMART MIX (DISPOSABLE) ×3 IMPLANT
TRAY FOLEY MTR SLVR 16FR STAT (SET/KITS/TRAYS/PACK) ×3 IMPLANT
WATER STERILE IRR 500ML POUR (IV SOLUTION) ×3 IMPLANT
WRAP-ON POLOR PAD MULTI XL (MISCELLANEOUS) ×1
WRAPON POLAR PAD KNEE (MISCELLANEOUS)
WRAPON POLOR PAD MULTI XL (MISCELLANEOUS) ×2

## 2021-10-30 NOTE — Progress Notes (Signed)
Patient arrived from OR, awake/alert x4. Received spinal anesthesia. Vitals baseline, afebrile. Patient states she is always cold and uses a small heater at home. Bear hugger placed for comfort and per request.  Spinal anesthesia worn off, able to move bil lower ext without event, pulses doppler bilaterally. Indwelling foley patent. Lunch ordered and patient 100% intake.  Heart rate 100's, slowly increased after meal, to 130's. Denies shortness of breath, chest pain, Dr. Andree Elk made aware, obtain EKG. EKG obtained ST, also ordered 12.5 meperidine for "shivering"  afebrile. Discussed latter with Dr. Andree Elk: anesthesia, OK for patient to be transferred to peri-surge. Patient is awake/alert x4. Pain 4/10, medicated as ordered.

## 2021-10-30 NOTE — Anesthesia Procedure Notes (Signed)
Spinal  Patient location during procedure: OR Start time: 10/30/2021 9:09 AM End time: 10/30/2021 9:13 AM Reason for block: surgical anesthesia Staffing Performed: resident/CRNA  Anesthesiologist: Piscitello, Precious Haws, MD Resident/CRNA: Nelda Marseille, CRNA Preanesthetic Checklist Completed: patient identified, IV checked, site marked, risks and benefits discussed, surgical consent, monitors and equipment checked, pre-op evaluation and timeout performed Spinal Block Patient position: sitting Prep: ChloraPrep Patient monitoring: heart rate, continuous pulse ox, blood pressure and cardiac monitor Approach: midline Location: L4-5 Injection technique: single-shot Needle Needle type: [place] and Introducer  Needle gauge: 25 G Needle length: 9 cm Assessment Sensory level: T10 Events: CSF return Additional Notes Negative paresthesia. Negative blood return. Positive free-flowing CSF. Expiration date of kit checked and confirmed. Patient tolerated procedure well, without complications.

## 2021-10-30 NOTE — Anesthesia Procedure Notes (Signed)
Date/Time: 10/30/2021 9:13 AM Performed by: Nelda Marseille, CRNA Oxygen Delivery Method: Simple face mask

## 2021-10-30 NOTE — Transfer of Care (Signed)
Immediate Anesthesia Transfer of Care Note  Patient: Autumn Burton  Procedure(s) Performed: TOTAL KNEE ARTHROPLASTY (Right: Knee)  Patient Location: PACU  Anesthesia Type:General and Spinal  Level of Consciousness: drowsy  Airway & Oxygen Therapy: Patient Spontanous Breathing and Patient connected to face mask oxygen  Post-op Assessment: Report given to RN and Post -op Vital signs reviewed and stable  Post vital signs: Reviewed and stable  Last Vitals:  Vitals Value Taken Time  BP 99/47 10/30/21 1121  Temp    Pulse 104 10/30/21 1125  Resp 27 10/30/21 1125  SpO2 100 % 10/30/21 1125  Vitals shown include unvalidated device data.  Last Pain:  Vitals:   10/30/21 0805  TempSrc: Tympanic  PainSc: 0-No pain      Patients Stated Pain Goal: 0 (27/63/94 3200)  Complications: No notable events documented.
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
## 2021-10-30 NOTE — Op Note (Signed)
.  mjmk

## 2021-10-30 NOTE — Progress Notes (Signed)
Patient resting in bed. Heart rate elevated while resting. Patient denies any chest pain or problems at this time. Placed patient on the monitor to watch heart rate.

## 2021-10-30 NOTE — Op Note (Signed)
10/30/2021  11:32 AM  PATIENT:  Autumn Burton   MRN: [id number]  PRE-OPERATIVE DIAGNOSIS:  Primary localized osteoarthritis of right knee   POST-OPERATIVE DIAGNOSIS:  Same   PROCEDURE:  Procedure(s): Right TOTAL KNEE ARTHROPLASTY   SURGEON: Laurene Footman, MD   ASSISTANTS: None   ANESTHESIA:   spinal   EBL: 100   BLOOD ADMINISTERED:none   DRAINS:  Incisional wound VAC     LOCAL MEDICATIONS USED:  MARCAINE    and OTHER Exparel and morphine   SPECIMEN:  No Specimen   DISPOSITION OF SPECIMEN:  N/A   COUNTS:  YES   TOURNIQUET: 67 minutes at 300 mm Hg   IMPLANTS: Medacta  GMK  system with 2  femur, 2 left tibia with short stem and 12 mm PS insert.  Size 1 patella, all components cemented.   DICTATION: Viviann Spare Dictation   patient was brought to the operating room and spinal anesthesia was obtained.  After prepping and draping the right leg in sterile fashion, and after patient identification and timeout procedures were completed, tourniquet was raised  and midline skin incision was made followed by medial parapatellar arthrotomy with severe medial compartment osteoarthritis, severe patellofemoral arthritis and severe lateral compartment arthritis, partial synovectomy was also carried out.   The ACL and PCL and fat pad were excised along with anterior horns of the meniscus. The proximal tibia cutting guide from  the extra medullary system was applied and the proximal tibia cut carried out.  The distal femoral cut was carried out in a similar fashion     The intramedullary femoral cutting guide applied with distal cut made then sizing placed in the size 2 cutting guide placed anterior posterior and chamfer cuts made.  The posterior horns of the menisci were removed at this point.   Injection of the above medication was carried out after the femoral and tibial cuts were carried out.  The 2 baseplate trial was placed pinned into position and proximal tibial preparation carried out with  drilling hand reaming and the keel punch followed by placement of the 2 femur and sizing the tibial insert size 12 millimeter gave the best fit with stability and full extension.  The distal femoral drill holes were made in the notch cut for the trochlear groove was then carried out along with the PS drill with trials were then removed the patella was cut using the patellar cutting guide and it sized to a size 1 after drill holes have been made  The knee was irrigated with pulsatile lavage and the bony surfaces dried the tibial component was cemented into place first.  Excess cement was removed and the polyethylene insert placed with a torque screw placed with a torque screwdriver tightened.  The distal femoral component was placed and the knee was held in extension as the patellar button was clamped into place.  After the cement was set, excess cement was removed and the knee was again irrigated thoroughly thoroughly irrigated.  The tourniquet was let down and hemostasis checked with electrocautery.  Stimulant beads with vancomycin were placed prior to wound closure the arthrotomy was repaired with #2 Ethibond followed by a heavy Quill suture,  followed by 3-0 V lock subcuticular closure, skin staples followed by incisional wound VAC and Polar Care.Marland Kitchen   PLAN OF CARE: Admit for overnight observation   PATIENT DISPOSITION:  PACU - hemodynamically stable.

## 2021-10-30 NOTE — Progress Notes (Signed)
Call received that second signature not on Type and screen form from lab drawn at 0750. This RN requested lab to draw.  Informed charge nurse of need for second draw.

## 2021-10-30 NOTE — Anesthesia Preprocedure Evaluation (Signed)
Anesthesia Evaluation  Patient identified by MRN, date of birth, ID band Patient awake    Reviewed: Allergy & Precautions, H&P , NPO status , Patient's Chart, lab work & pertinent test results, reviewed documented beta blocker date and time   Airway Mallampati: II   Neck ROM: full    Dental  (+) Poor Dentition   Pulmonary neg pulmonary ROS,    Pulmonary exam normal        Cardiovascular Exercise Tolerance: Poor hypertension, On Medications negative cardio ROS Normal cardiovascular exam Rhythm:regular Rate:Normal     Neuro/Psych  Neuromuscular disease negative psych ROS   GI/Hepatic Neg liver ROS, GERD  ,  Endo/Other  Hyperthyroidism   Renal/GU Renal disease  negative genitourinary   Musculoskeletal   Abdominal   Peds  Hematology  (+) Blood dyscrasia, anemia ,   Anesthesia Other Findings Past Medical History: No date: Anemia No date: Arthritis No date: GERD (gastroesophageal reflux disease) No date: Hypertension No date: Lymphedema of both lower extremities     Comment:  being followed by Dr Rhoderick Moody Boots bil No date: Venous ulcer of right leg (HCC)     Comment:  thigh-being followed by dr dew Past Surgical History: 10/28/2016: COLONOSCOPY WITH PROPOFOL; N/A     Comment:  Procedure: COLONOSCOPY WITH PROPOFOL;  Surgeon: Jonathon Bellows, MD;  Location: [facility] ENDOSCOPY;  Service: Endoscopy;              Laterality: N/A; No date: DG RIGHT TIBIA AND FIBULA ([place] HX) No date: LEG SURGERY; Right 06/05/2021: TOTAL KNEE ARTHROPLASTY; Left     Comment:  Procedure: TOTAL KNEE ARTHROPLASTY;  Surgeon: Hessie Knows, MD;  Location: [facility] ORS;  Service: Orthopedics;               Laterality: Left;   Reproductive/Obstetrics negative OB ROS                             Anesthesia Physical Anesthesia Plan  ASA: 3  Anesthesia Plan: General   Post-op Pain Management:     Induction:   PONV Risk Score and Plan:   Airway Management Planned:   Additional Equipment:   Intra-op Plan:   Post-operative Plan:   Informed Consent: I have reviewed the patients History and Physical, chart, labs and discussed the procedure including the risks, benefits and alternatives for the proposed anesthesia with the patient or authorized representative who has indicated his/her understanding and acceptance.     Dental Advisory Given  Plan Discussed with: CRNA  Anesthesia Plan Comments:         Anesthesia Quick Evaluation

## 2021-10-30 NOTE — Evaluation (Signed)
Physical Therapy Evaluation Patient Details Name: Autumn Burton MRN: [id number] DOB: December 29, 1955 Today's Date: 10/30/2021  History of Present Illness  Pt is a 65 y.o. female s/p R TKA secondary to OA 10/30/21.  PMH includes htn, anemia, B LE lymphedema, venous ulcer R leg/thigh, L TKA 06/05/21, h/o R leg fx surgery, L lumpectomy/partial mastectomy.  Clinical Impression  Prior to surgery, pt was modified independent ambulating with SPC; lives alone on main level of home with ramp to enter.  Pt's HR elevated at rest (124-127 bpm) and HR increased to 135-136 bpm with activity; HR decreased to 128-129 bpm end of session at rest (nurse and MD notified/aware of elevated HR).  Currently pt is mod to max assist semi-supine to sitting edge of bed; deferred further activity d/t R knee pain (7/10 at rest beginning/end of session but increased to 8-9/10 with activity).  Pt able to sit on edge of bed for about 20 minutes (delayed laying down d/t pt became nauseas in sitting but eventually resolved and pt assisted back to bed with 2 assist).  Impaired R knee flexion and extension ROM and strength noted.  Pt would benefit from skilled PT to address noted impairments and functional limitations (see below for any additional details).  Upon hospital discharge, pt would benefit from SNF (pt reports being agreeable to SNF).    Recommendations for follow up therapy are one component of a multi-disciplinary discharge planning process, led by the attending physician.  Recommendations may be updated based on patient status, additional functional criteria and insurance authorization.  Follow Up Recommendations Skilled nursing-short term rehab (<3 hours/day)    Assistance Recommended at Discharge Frequent or constant Supervision/Assistance  Functional Status Assessment Patient has had a recent decline in their functional status and demonstrates the ability to make significant improvements in function in a reasonable and  predictable amount of time.  Equipment Recommendations  Other (comment) (pt has RW and BSC at home already)    Recommendations for Other Services OT consult     Precautions / Restrictions Precautions Precautions: Fall;Knee Precaution Booklet Issued: Yes (comment) Precaution Comments: wound vac; latex allergy Restrictions Weight Bearing Restrictions: Yes RLE Weight Bearing: Weight bearing as tolerated      Mobility  Bed Mobility Overal bed mobility: Needs Assistance Bed Mobility: Supine to Sit;Sit to Supine     Supine to sit: Mod assist;Max assist;HOB elevated Sit to supine: +2 for physical assistance;HOB elevated   General bed mobility comments: assist for trunk and B LE's; vc's for technique; 2 assist to boost pt up in bed end of session using bed pad    Transfers                   General transfer comment: Pt declined d/t R knee pain    Ambulation/Gait               General Gait Details: Pt declined d/t R knee pain  Stairs            Wheelchair Mobility    Modified Rankin (Stroke Patients Only)       Balance Overall balance assessment: Needs assistance Sitting-balance support: No upper extremity supported;Feet supported Sitting balance-Leahy Scale: Good Sitting balance - Comments: steady sitting reaching within BOS                                     Pertinent Vitals/Pain Pain Assessment: 0-10 Pain  Score: 8  Pain Location: R knee Pain Descriptors / Indicators: Aching;Sore;Tender Pain Intervention(s): Limited activity within patient's tolerance;Monitored during session;Premedicated before session;Repositioned;Other (comment) (polar care applied) O2 sats WFL on room air.    Home Living Family/patient expects to be discharged to:: Skilled nursing facility Living Arrangements: Alone Available Help at Discharge: Family;Available PRN/intermittently (pt's son) Type of Home: House Home Access: Ramped entrance        Home Layout: Two level;Able to live on main level with bedroom/bathroom Home Equipment: Shower seat;Cane - single point;Rolling Walker (2 wheels);BSC/3in1      Prior Function Prior Level of Function : Independent/Modified Independent             Mobility Comments: Ambulatory with SPC       Hand Dominance        Extremity/Trunk Assessment   Upper Extremity Assessment Upper Extremity Assessment: Generalized weakness    Lower Extremity Assessment Lower Extremity Assessment: RLE deficits/detail (at least 3/5 AROM hip flexion, knee flexion/extension, and DF/PF) RLE Deficits / Details: at least 3-/5 AROM hip flexion; at least 3/5 AROM ankle DF/PF; good quad set isometric strength RLE: Unable to fully assess due to pain    Cervical / Trunk Assessment Cervical / Trunk Assessment: Other exceptions Cervical / Trunk Exceptions: forward head/shoulders  Communication   Communication: No difficulties  Cognition Arousal/Alertness:  (pt sleepy at times but motivated to participate in therapy) Behavior During Therapy: [facility] for tasks assessed/performed Overall Cognitive Status: Within Functional Limits for tasks assessed                                          General Comments General comments (skin integrity, edema, etc.): Wound vac in place (increased red drainage noted--MD Rudene Christians arrived and reports no concerns regarding this); R LE externally rotated laying in bed requiring repositioning to promote R knee extension.  Nursing cleared pt for participation in physical therapy (discussed pt's elevated HR and MD aware).  Pt agreeable to PT session.    Exercises Total Joint Exercises Ankle Circles/Pumps: AROM;Strengthening;Both;10 reps;Supine Quad Sets: AROM;Strengthening;Both;10 reps;Supine Heel Slides: AAROM;Strengthening;Right;10 reps;Supine (minimal R knee flexion ROM d/t R knee pain) Hip ABduction/ADduction: AAROM;Strengthening;Right;10 reps;Supine Goniometric  ROM: R knee ROM 20-60 degrees   Assessment/Plan    PT Assessment Patient needs continued PT services  PT Problem List Decreased strength;Decreased range of motion;Decreased activity tolerance;Decreased balance;Decreased mobility;Cardiopulmonary status limiting activity;Pain;Decreased skin integrity       PT Treatment Interventions DME instruction;Gait training;Functional mobility training;Therapeutic activities;Therapeutic exercise;Balance training;Patient/family education    PT Goals (Current goals can be found in the Care Plan section)  Acute Rehab PT Goals Patient Stated Goal: to improve pain and mobility PT Goal Formulation: With patient Time For Goal Achievement: 11/13/21 Potential to Achieve Goals: Good    Frequency BID   Barriers to discharge Decreased caregiver support      Co-evaluation               AM-PAC PT "6 Clicks" Mobility  Outcome Measure Help needed turning from your back to your side while in a flat bed without using bedrails?: A Lot Help needed moving from lying on your back to sitting on the side of a flat bed without using bedrails?: A Lot Help needed moving to and from a bed to a chair (including a wheelchair)?: Total Help needed standing up from a chair using your arms (e.g., wheelchair  or bedside chair)?: Total Help needed to walk in hospital room?: Total Help needed climbing 3-5 steps with a railing? : Total 6 Click Score: 8    End of Session   Activity Tolerance: Patient limited by pain Patient left: in bed;with call bell/phone within reach;with bed alarm set;with SCD's reapplied;Other (comment) (B heels floating via sheet rolls; polar care in place) Nurse Communication: Mobility status;Precautions;Weight bearing status;Other (comment) (pt's nausea and pt's pain status) PT Visit Diagnosis: Other abnormalities of gait and mobility (R26.89);Muscle weakness (generalized) (M62.81);Pain Pain - Right/Left: Right Pain - part of body: Knee     Time: 1645-1740 PT Time Calculation (min) (ACUTE ONLY): 55 min   Charges:   PT Evaluation $PT Eval Low Complexity: 1 Low PT Treatments $Therapeutic Exercise: 8-22 mins $Therapeutic Activity: 8-22 mins       Leitha Bleak, PT 10/30/21, 6:59 PM

## 2021-10-30 NOTE — Progress Notes (Signed)
IV team consult/placement ordered due to patient's history of inability obtain peripheral access.  Also, Patient's labs drawn by IV Team RN.

## 2021-10-31 ENCOUNTER — Encounter: Payer: Self-pay | Admitting: Orthopedic Surgery

## 2021-10-31 DIAGNOSIS — Z743 Need for continuous supervision: Secondary | ICD-10-CM | POA: Diagnosis not present

## 2021-10-31 DIAGNOSIS — D62 Acute posthemorrhagic anemia: Secondary | ICD-10-CM | POA: Diagnosis not present

## 2021-10-31 DIAGNOSIS — M1711 Unilateral primary osteoarthritis, right knee: Secondary | ICD-10-CM | POA: Diagnosis present

## 2021-10-31 DIAGNOSIS — Z79899 Other long term (current) drug therapy: Secondary | ICD-10-CM | POA: Diagnosis not present

## 2021-10-31 DIAGNOSIS — Z6841 Body Mass Index (BMI) 40.0 and over, adult: Secondary | ICD-10-CM | POA: Diagnosis not present

## 2021-10-31 DIAGNOSIS — R69 Illness, unspecified: Secondary | ICD-10-CM | POA: Diagnosis not present

## 2021-10-31 DIAGNOSIS — Z91048 Other nonmedicinal substance allergy status: Secondary | ICD-10-CM | POA: Diagnosis not present

## 2021-10-31 DIAGNOSIS — Z79631 Long term (current) use of antimetabolite agent: Secondary | ICD-10-CM | POA: Diagnosis not present

## 2021-10-31 DIAGNOSIS — M069 Rheumatoid arthritis, unspecified: Secondary | ICD-10-CM | POA: Diagnosis present

## 2021-10-31 DIAGNOSIS — Z8249 Family history of ischemic heart disease and other diseases of the circulatory system: Secondary | ICD-10-CM | POA: Diagnosis not present

## 2021-10-31 DIAGNOSIS — R000 Tachycardia, unspecified: Secondary | ICD-10-CM | POA: Diagnosis not present

## 2021-10-31 DIAGNOSIS — Z96652 Presence of left artificial knee joint: Secondary | ICD-10-CM | POA: Diagnosis present

## 2021-10-31 DIAGNOSIS — Z7982 Long term (current) use of aspirin: Secondary | ICD-10-CM | POA: Diagnosis not present

## 2021-10-31 DIAGNOSIS — G629 Polyneuropathy, unspecified: Secondary | ICD-10-CM | POA: Diagnosis present

## 2021-10-31 DIAGNOSIS — G8929 Other chronic pain: Secondary | ICD-10-CM | POA: Diagnosis present

## 2021-10-31 DIAGNOSIS — I10 Essential (primary) hypertension: Secondary | ICD-10-CM | POA: Diagnosis present

## 2021-10-31 DIAGNOSIS — R5381 Other malaise: Secondary | ICD-10-CM | POA: Diagnosis not present

## 2021-10-31 DIAGNOSIS — K219 Gastro-esophageal reflux disease without esophagitis: Secondary | ICD-10-CM | POA: Diagnosis present

## 2021-10-31 DIAGNOSIS — Z91040 Latex allergy status: Secondary | ICD-10-CM | POA: Diagnosis not present

## 2021-10-31 LAB — BASIC METABOLIC PANEL
Anion gap: 5 (ref 5–15)
BUN: 25 mg/dL — ABNORMAL HIGH (ref 8–23)
CO2: 24 mmol/L (ref 22–32)
Calcium: 8.6 mg/dL — ABNORMAL LOW (ref 8.9–10.3)
Chloride: 104 mmol/L (ref 98–111)
Creatinine, Ser: 1.16 mg/dL — ABNORMAL HIGH (ref 0.44–1.00)
GFR, Estimated: 52 mL/min — ABNORMAL LOW (ref >60)
Glucose, Bld: 175 mg/dL — ABNORMAL HIGH (ref 70–99)
Potassium: 4.5 mmol/L (ref 3.5–5.1)
Sodium: 133 mmol/L — ABNORMAL LOW (ref 135–145)

## 2021-10-31 LAB — CBC
HCT: 26.7 % — ABNORMAL LOW (ref 36.0–46.0)
Hemoglobin: 8.5 g/dL — ABNORMAL LOW (ref 12.0–15.0)
MCH: 30.5 pg (ref 26.0–34.0)
MCHC: 31.8 g/dL (ref 30.0–36.0)
MCV: 95.7 fL (ref 80.0–100.0)
Platelets: 253 10*3/uL (ref 150–400)
RBC: 2.79 MIL/uL — ABNORMAL LOW (ref 3.87–5.11)
RDW: 15.1 % (ref 11.5–15.5)
WBC: 8.6 10*3/uL (ref 4.0–10.5)
nRBC: 0 % (ref 0.0–0.2)

## 2021-10-31 MED ORDER — HYDROCODONE-ACETAMINOPHEN 5-325 MG PO TABS
ORAL_TABLET | ORAL | Status: AC
  Administered 2021-10-31: 14:00:00 2 via ORAL
  Filled 2021-10-31: qty 2

## 2021-10-31 MED ORDER — ONDANSETRON HCL 4 MG/2ML IJ SOLN
INTRAMUSCULAR | Status: AC
  Administered 2021-10-31: 10:00:00 4 mg via INTRAVENOUS
  Filled 2021-10-31: qty 2

## 2021-10-31 MED ORDER — METHOCARBAMOL 500 MG PO TABS
ORAL_TABLET | ORAL | Status: AC
  Filled 2021-10-31: qty 1

## 2021-10-31 MED ORDER — TRAMADOL HCL 50 MG PO TABS
ORAL_TABLET | ORAL | Status: AC
  Filled 2021-10-31: qty 1

## 2021-10-31 MED ORDER — METOPROLOL TARTRATE 25 MG PO TABS
ORAL_TABLET | ORAL | Status: AC
  Administered 2021-10-31: 10:00:00 25 mg via ORAL
  Filled 2021-10-31: qty 1

## 2021-10-31 MED ORDER — GABAPENTIN 300 MG PO CAPS
ORAL_CAPSULE | ORAL | Status: AC
  Filled 2021-10-31: qty 1

## 2021-10-31 MED ORDER — MORPHINE SULFATE (PF) 4 MG/ML IV SOLN
INTRAVENOUS | Status: AC
  Administered 2021-10-31: 12:00:00 1 mg via INTRAVENOUS
  Filled 2021-10-31: qty 1

## 2021-10-31 MED ORDER — POTASSIUM CHLORIDE CRYS ER 20 MEQ PO TBCR
EXTENDED_RELEASE_TABLET | ORAL | Status: AC
  Filled 2021-10-31: qty 1

## 2021-10-31 MED ORDER — GABAPENTIN 300 MG PO CAPS
ORAL_CAPSULE | ORAL | Status: AC
  Administered 2021-10-31: 10:00:00 300 mg via ORAL
  Filled 2021-10-31: qty 1

## 2021-10-31 MED ORDER — DOCUSATE SODIUM 100 MG PO CAPS
ORAL_CAPSULE | ORAL | Status: AC
  Administered 2021-10-31: 10:00:00 100 mg via ORAL
  Filled 2021-10-31: qty 1

## 2021-10-31 MED ORDER — SODIUM CHLORIDE 0.9 % IV BOLUS
500.0000 mL | Freq: Once | INTRAVENOUS | Status: AC
  Administered 2021-10-31: 12:00:00 500 mL via INTRAVENOUS

## 2021-10-31 MED ORDER — POTASSIUM CHLORIDE CRYS ER 20 MEQ PO TBCR
EXTENDED_RELEASE_TABLET | ORAL | Status: AC
  Administered 2021-10-31: 18:00:00 10 meq via ORAL
  Filled 2021-10-31: qty 1

## 2021-10-31 MED ORDER — TRAMADOL HCL 50 MG PO TABS
ORAL_TABLET | ORAL | Status: AC
  Administered 2021-10-31: 10:00:00 50 mg via ORAL
  Filled 2021-10-31: qty 1

## 2021-10-31 MED ORDER — METOCLOPRAMIDE HCL 5 MG/ML IJ SOLN
INTRAMUSCULAR | Status: AC
  Filled 2021-10-31: qty 2

## 2021-10-31 NOTE — TOC Progression Note (Signed)
Transition of Care [facility]) - Progression Note    Patient Details  Name: Autumn Burton MRN: [id number] Date of Birth: 14-May-1956  Transition of [facility]) CM/SW [place], RN Phone Number: 10/31/2021, 3:45 PM  Clinical Narrative:   Damaris Schooner with Seth Bake at [facility], She is going to review the patient and see if she can offer a bed, I called [facility] and spoke With Marlowe Kays to request Ins approval, As well as EMS approval, will update bed once I get it, the patient doe snot want to go to [facility] . She has been to [facility] in the past, She plans to return home after STR         Expected Discharge Plan and Services                                                 Social Determinants of Health (SDOH) Interventions    Readmission Risk Interventions No flowsheet data found.

## 2021-10-31 NOTE — Progress Notes (Addendum)
° °  Subjective: 1 Day Post-Op Procedure(s) (LRB): TOTAL KNEE ARTHROPLASTY (Right) Patient reports pain as moderate.   Patient is well, and has had no acute complaints or problems Denies any CP, SOB, ABD pain. We will continue therapy today.  Plan is to go Skilled nursing facility after hospital stay.  Objective: Vital signs in last 24 hours: Temp:  [97 F (36.1 C)-99.2 F (37.3 C)] 97.7 F (36.5 C) (12/21 0400) Pulse Rate:  [95-142] 118 (12/21 0400) Resp:  [16-27] 18 (12/20 1900) BP: (99-171)/(47-92) 148/69 (12/21 0400) SpO2:  [93 %-100 %] 97 % (12/21 0400)  Intake/Output from previous day: 12/20 0701 - 12/21 0700 In: 2800 [P.O.:325; I.V.:2325; IV Piggyback:150] Out: 2350 [Urine:2250; Blood:100] Intake/Output this shift: No intake/output data recorded.  Recent Labs    10/30/21 0750 10/30/21 0843 10/31/21 0421  HGB 10.1* 9.5* 8.5*   Recent Labs    10/30/21 0843 10/31/21 0421  WBC 6.9 8.6  RBC 3.04* 2.79*  HCT 29.7* 26.7*  PLT 271 253   Recent Labs    10/30/21 0843 10/31/21 0421  NA  --  133*  K  --  4.5  CL  --  104  CO2  --  24  BUN  --  25*  CREATININE 1.21* 1.16*  GLUCOSE  --  175*  CALCIUM  --  8.6*   No results for input(s): LABPT, INR in the last 72 hours.  EXAM General - Patient is Alert, Appropriate, and Oriented Extremity - Neurovascular intact Sensation intact distally Intact pulses distally Dorsiflexion/Plantar flexion intact Dressing - dressing C/D/I and no drainage, provena intact with 250 cc drainage Motor Function - intact, moving foot and toes well on exam.   Past Medical History:  Diagnosis Date   Anemia    Arthritis    GERD (gastroesophageal reflux disease)    Hypertension    Lymphedema of both lower extremities    being followed by Dr Rhoderick Moody Boots bil   Venous ulcer of right leg ([place])    thigh-being followed by dr dew    Assessment/Plan:   1 Day Post-Op Procedure(s) (LRB): TOTAL KNEE ARTHROPLASTY (Right) Principal  Problem:   S/P TKR (total knee replacement) using cement, right  Estimated body mass index is 47.14 kg/m as calculated from the following:   Height as of this encounter: 5\' 3"  (1.6 m).   Weight as of this encounter: 120.7 kg. Advance diet Up with therapy Pain controlled Labs stable Continue to monitor VS, give 500 cc IV bolus Work on BM CM to assist with discharge  DVT Prophylaxis - Lovenox, Foot Pumps, and TED hose Weight-Bearing as tolerated to right leg   T. Rachelle Hora, PA-C [facility] 10/31/2021, 8:24 AM

## 2021-10-31 NOTE — Progress Notes (Signed)
Physical Therapy Treatment Patient Details Name: Autumn Burton MRN: [id number] DOB: Dec 10, 1955 Today's Date: 10/31/2021   History of Present Illness Pt is a 65 y.o. female s/p R TKA secondary to OA 10/30/21.  PMH includes htn, anemia, B LE lymphedema, venous ulcer R leg/thigh, L TKA 06/05/21, h/o R leg fx surgery, L lumpectomy/partial mastectomy.    PT Comments    Pt received sitting in recliner, agreeable to therapy. Pt states she needs to use the restroom. BSC set up nest to chair, between recliner and bed. Pt was unable to take side steps required to sit on BSC. Attempted side steps twice. Due to urgency, pt remained in chair and bedpan was placed under pt. Multiple STS performed in beginning of session to attempt side steps and place/remove bedpan. Once complete, chair was rolled perpendicular to bed. Pt performed stand pivot transfer back to bed with improved RLE control. MOD A x2 provided for lifting assist, to steady, prevent knee buckling, manage RW and guide hips to sitting surface. Pt was positioned in bed with all needs met. Will continue to progress Standing, transferring and functional activity. Would benefit from skilled PT to address above deficits and promote optimal return to PLOF.   Recommendations for follow up therapy are one component of a multi-disciplinary discharge planning process, led by the attending physician.  Recommendations may be updated based on patient status, additional functional criteria and insurance authorization.  Follow Up Recommendations  Skilled nursing-short term rehab (<3 hours/day)     Assistance Recommended at Discharge Frequent or constant Supervision/Assistance  Equipment Recommendations  None recommended by PT    Recommendations for Other Services OT consult     Precautions / Restrictions Precautions Precautions: Fall;Knee Precaution Comments: wound vac; latex allergy Restrictions Weight Bearing Restrictions: Yes RLE Weight Bearing:  Weight bearing as tolerated     Mobility  Bed Mobility Overal bed mobility: Needs Assistance Bed Mobility: Sit to Supine     Supine to sit: Min assist;HOB elevated Sit to supine: Mod assist;+2 for physical assistance   General bed mobility comments: MOD A to manage BLE into bed - 1 person per LE. Assist to boost towards [facility].    Transfers Overall transfer level: Needs assistance Equipment used: Rolling walker (2 wheels) Transfers: Sit to/from Stand;Bed to chair/wheelchair/BSC Sit to Stand: Mod assist;+2 physical assistance;+2 safety/equipment Stand pivot transfers: +2 physical assistance;+2 safety/equipment;Mod assist        Lateral/Scoot Transfers: Supervision General transfer comment: MOD A x2 for lifting assist from recliner. 5 reps within session. Stand pivot recliner>bed - pt managed RLE while PT and RN steadied, prevented knee buckling, managed RW and guided hips to sitting surface.    Ambulation/Gait               General Gait Details: attempted side steps to [facility] - unable to step with RLE and WB through LLE. Deferred after 2 attempts.   Stairs             Wheelchair Mobility    Modified Rankin (Stroke Patients Only)       Balance Overall balance assessment: Needs assistance Sitting-balance support: No upper extremity supported;Feet supported Sitting balance-Leahy Scale: Good Sitting balance - Comments: Steady sitting EOB   Standing balance support: Bilateral upper extremity supported;During functional activity;Reliant on assistive device for balance Standing balance-Leahy Scale: Poor Standing balance comment: requires MOD A of 2 people with BUE support of RW to remain standing  Cognition Arousal/Alertness: Awake/alert Behavior During Therapy: WFL for tasks assessed/performed Overall Cognitive Status: Within Functional Limits for tasks assessed                                           Exercises Total Joint Exercises Ankle Circles/Pumps: AROM;Strengthening;Both;10 reps;Supine Hip ABduction/ADduction: Strengthening;10 reps;AROM;Both;Seated Long Arc Quad: AAROM;Strengthening;Right;10 reps;Seated Marching in Standing: Seated;AAROM;Strengthening;Right;10 reps (in SITTING)    General Comments General comments (skin integrity, edema, etc.): HR 99-114 throughout session. SpO2 high 90's on RA.      Pertinent Vitals/Pain Pain Assessment: Faces Pain Score: 7  Faces Pain Scale: Hurts little more Pain Location: R knee at rest (increased with activity) Pain Descriptors / Indicators: Aching;Sore;Tender Pain Intervention(s): Ice applied;Monitored during session;Limited activity within patient's tolerance;Premedicated before session;Repositioned    Home Living                          Prior Function            PT Goals (current goals can now be found in the care plan section) Acute Rehab PT Goals Patient Stated Goal: to improve pain and mobility PT Goal Formulation: With patient Time For Goal Achievement: 11/13/21 Potential to Achieve Goals: Good    Frequency    BID      PT Plan      Co-evaluation              AM-PAC PT "6 Clicks" Mobility   Outcome Measure  Help needed turning from your back to your side while in a flat bed without using bedrails?: A Lot Help needed moving from lying on your back to sitting on the side of a flat bed without using bedrails?: A Little Help needed moving to and from a bed to a chair (including a wheelchair)?: A Lot Help needed standing up from a chair using your arms (e.g., wheelchair or bedside chair)?: A Lot Help needed to walk in hospital room?: Total Help needed climbing 3-5 steps with a railing? : Total 6 Click Score: 11    End of Session Equipment Utilized During Treatment: Gait belt Activity Tolerance: Patient limited by pain Patient left: Other (comment);in bed;with call bell/phone within reach;with  bed alarm set;with SCD's reapplied (polar care in place) Nurse Communication: Mobility status;Precautions;Weight bearing status PT Visit Diagnosis: Other abnormalities of gait and mobility (R26.89);Muscle weakness (generalized) (M62.81);Pain Pain - Right/Left: Right Pain - part of body: Knee     Time: 5449-2010 PT Time Calculation (min) (ACUTE ONLY): 30 min  Charges:  $Therapeutic Exercise: 8-22 mins $Therapeutic Activity: 23-37 mins                     Patrina Levering PT, DPT 10/31/21 3:51 PM [phone]

## 2021-10-31 NOTE — TOC Progression Note (Signed)
Transition of Care [facility]) - Progression Note    Patient Details  Name: ARTRICE KRAKER MRN: [id number] Date of Birth: Apr 16, 1956  Transition of Care [facility]) CM/SW [place], RN Phone Number: 10/31/2021, 8:34 AM  Clinical Narrative:    Patient agreeable to go to SNF, Bedsearch sent, Passr obtained, FL2 completed, will review bed offers once obtained        Expected Discharge Plan and Services                                                 Social Determinants of Health (SDOH) Interventions    Readmission Risk Interventions No flowsheet data found.

## 2021-10-31 NOTE — Progress Notes (Signed)
Physical Therapy Treatment Patient Details Name: Autumn Burton MRN: [id number] DOB: 08/04/1956 Today's Date: 10/31/2021   History of Present Illness Pt is a 65 y.o. female s/p R TKA secondary to OA 10/30/21.  PMH includes htn, anemia, B LE lymphedema, venous ulcer R leg/thigh, L TKA 06/05/21, h/o R leg fx surgery, L lumpectomy/partial mastectomy.    PT Comments    Pt received supine in bed, agreeable to therapy. Reports a 7/10 pain at rest in R knee; increased to 9/10 pain after mobility - RN aware and providing meds. Pt improved bed mobility to MIN A, PT managing RLE. She performed seated therex as PT waited for RN to assist as +2 for standing transfer. Pt performed STS x2 reps, first pt stated she felt like she was falling and sat back down. The second, PT blocked RLE to prevent it sliding forward and pt was able to shift hips forward until COM was over BOS. Pt does require +2 for all standing mobility for pt and therapist safety as well as physical assist. MOD A x2 for lifting assist from elevated EOB - x2 reps. Stand pivot performed to recliner with MOD A to continuing holding and steadying pt, MAX A provided by PT to steady, manage RW and mobilize/pivot RLE as pt reported she could not lift or "shimmy" RLE. Pt remained sitting up in recliner at end of session. Plan with RN for PT to return at 2pm to return pt to bed. Would benefit from skilled PT to address above deficits and promote optimal return to PLOF.    Recommendations for follow up therapy are one component of a multi-disciplinary discharge planning process, led by the attending physician.  Recommendations may be updated based on patient status, additional functional criteria and insurance authorization.  Follow Up Recommendations  Skilled nursing-short term rehab (<3 hours/day)     Assistance Recommended at Discharge Frequent or constant Supervision/Assistance  Equipment Recommendations  None recommended by PT     Recommendations for Other Services OT consult     Precautions / Restrictions Precautions Precautions: Fall;Knee Precaution Comments: wound vac; latex allergy Restrictions Weight Bearing Restrictions: Yes RLE Weight Bearing: Weight bearing as tolerated     Mobility  Bed Mobility Overal bed mobility: Needs Assistance Bed Mobility: Supine to Sit     Supine to sit: Min assist;HOB elevated Sit to supine: Max assist;HOB elevated   General bed mobility comments: MIN A to manage RLE as pt managed LLE and trunk    Transfers Overall transfer level: Needs assistance Equipment used: Rolling walker (2 wheels) Transfers: Sit to/from Stand;Bed to chair/wheelchair/BSC Sit to Stand: Mod assist;+2 physical assistance;+2 safety/equipment;From elevated surface Stand pivot transfers: +2 physical assistance;+2 safety/equipment;Mod assist;Max assist;From elevated surface        Lateral/Scoot Transfers: Mod assist General transfer comment: MOD A x2 for lifting assist from elevated EOB - x2 reps. Stand pivot performed to recliner with MOD A to continuing holding and steadying pt, MAX A provided by PT to steady, manage RW and mobilize/pivot RLE as pt reported she could not lift or "shimmy" RLE.    Ambulation/Gait               General Gait Details: deferred   Stairs             Wheelchair Mobility    Modified Rankin (Stroke Patients Only)       Balance Overall balance assessment: Needs assistance Sitting-balance support: No upper extremity supported;Feet supported Sitting balance-Leahy Scale: Good Sitting balance -  Comments: Steady sitting EOB   Standing balance support: Bilateral upper extremity supported;During functional activity;Reliant on assistive device for balance Standing balance-Leahy Scale: Poor Standing balance comment: requires MOD-MAX A of 2 people with BUE support of RW to remain standing                            Cognition  Arousal/Alertness: Awake/alert Behavior During Therapy: WFL for tasks assessed/performed Overall Cognitive Status: Within Functional Limits for tasks assessed                                          Exercises Total Joint Exercises Ankle Circles/Pumps: AROM;Strengthening;Both;10 reps;Supine Hip ABduction/ADduction: Strengthening;10 reps;AROM;Both;Seated Long Arc Quad: AAROM;Strengthening;Right;10 reps;Seated Marching in Standing: Seated;AAROM;Strengthening;Right;10 reps (in SITTING) Other Exercises Other Exercises: Pt educated re: OT role, DME recs, d/c recs, falls prevention, adapted dressing strategies, and polar care mgmt    General Comments General comments (skin integrity, edema, etc.): HR 99-114 throughout session. SpO2 high 90's on RA.      Pertinent Vitals/Pain Pain Assessment: 0-10 Pain Score: 7  Pain Location: R knee at rest Pain Descriptors / Indicators: Aching;Sore;Tender Pain Intervention(s): Ice applied;Repositioned;Premedicated before session;Patient requesting pain meds-RN notified;Limited activity within patient's tolerance;Monitored during session    Home Living Family/patient expects to be discharged to:: Skilled nursing facility Living Arrangements: Alone Available Help at Discharge: Family;Available PRN/intermittently Type of Home: House Home Access: Ramped entrance       Home Layout: Two level;Able to live on main level with bedroom/bathroom Home Equipment: Shower seat;Cane - single point;Rolling Walker (2 wheels);BSC/3in1      Prior Function            PT Goals (current goals can now be found in the care plan section) Acute Rehab PT Goals Patient Stated Goal: to improve pain and mobility PT Goal Formulation: With patient Time For Goal Achievement: 11/13/21 Potential to Achieve Goals: Good    Frequency    BID      PT Plan      Co-evaluation              AM-PAC PT "6 Clicks" Mobility   Outcome Measure  Help  needed turning from your back to your side while in a flat bed without using bedrails?: A Lot Help needed moving from lying on your back to sitting on the side of a flat bed without using bedrails?: A Little Help needed moving to and from a bed to a chair (including a wheelchair)?: A Lot Help needed standing up from a chair using your arms (e.g., wheelchair or bedside chair)?: A Lot Help needed to walk in hospital room?: Total Help needed climbing 3-5 steps with a railing? : Total 6 Click Score: 11    End of Session Equipment Utilized During Treatment: Gait belt Activity Tolerance: Patient limited by pain Patient left: with call bell/phone within reach;in chair;Other (comment) (polar care in place) Nurse Communication: Mobility status;Precautions;Weight bearing status PT Visit Diagnosis: Other abnormalities of gait and mobility (R26.89);Muscle weakness (generalized) (M62.81);Pain Pain - Right/Left: Right Pain - part of body: Knee     Time: 7169-6789 PT Time Calculation (min) (ACUTE ONLY): 31 min  Charges:  $Therapeutic Exercise: 8-22 mins $Therapeutic Activity: 8-22 mins                     Patrina Levering  PT, DPT 10/31/21 12:51 PM [phone]

## 2021-10-31 NOTE — Evaluation (Signed)
Occupational Therapy Evaluation Patient Details Name: Autumn Burton MRN: [id number] DOB: 12/07/1955 Today's Date: 10/31/2021   History of Present Illness Pt is a 65 y.o. female s/p R TKA secondary to OA 10/30/21.  PMH includes htn, anemia, B LE lymphedema, venous ulcer R leg/thigh, L TKA 06/05/21, h/o R leg fx surgery, L lumpectomy/partial mastectomy.   Clinical Impression   Autumn Burton was seen for OT evaluation this date. Prior to hospital admission, pt was MOD I for mobility and ADLs using SPC. Pt lives alone. Pt presents to acute OT demonstrating impaired ADL performance and functional mobility 2/2 decreased activity tolerance and functional strength/ROM/balance deficits. Pt currently requires MAX A don B socks seated EOB. MOD A lateral scooting along EOB. SETUP + SUPERVISION face washing sitting EOB. MAX A sup<>sit. HR 122-127 t/o session. SpO2 high 90s on RA, found with [place] doffed. Pt would benefit from skilled OT to address noted impairments and functional limitations (see below for any additional details) in order to maximize safety and independence while minimizing falls risk and caregiver burden. Upon hospital discharge, recommend STR to maximize pt safety and return to PLOF.       Recommendations for follow up therapy are one component of a multi-disciplinary discharge planning process, led by the attending physician.  Recommendations may be updated based on patient status, additional functional criteria and insurance authorization.   Follow Up Recommendations  Skilled nursing-short term rehab (<3 hours/day)    Assistance Recommended at Discharge Intermittent Supervision/Assistance  Functional Status Assessment  Patient has had a recent decline in their functional status and demonstrates the ability to make significant improvements in function in a reasonable and predictable amount of time.  Equipment Recommendations  Other (comment) (defer to next venue of care)     Recommendations for Other Services       Precautions / Restrictions Precautions Precautions: Fall;Knee Precaution Comments: wound vac; latex allergy Restrictions Weight Bearing Restrictions: Yes RLE Weight Bearing: Weight bearing as tolerated      Mobility Bed Mobility Overal bed mobility: Needs Assistance Bed Mobility: Supine to Sit;Sit to Supine     Supine to sit: Max assist;HOB elevated Sit to supine: Max assist;HOB elevated        Transfers Overall transfer level: Needs assistance   Transfers: Bed to chair/wheelchair/BSC            Lateral/Scoot Transfers: Mod assist        Balance Overall balance assessment: Needs assistance Sitting-balance support: No upper extremity supported;Feet supported Sitting balance-Leahy Scale: Good Sitting balance - Comments: steady sitting reaching within BOS                                   ADL either performed or assessed with clinical judgement   ADL Overall ADL's : Needs assistance/impaired                                       General ADL Comments: MAX A don B socks seated EOB. MOD A lateral scooting along EOB. SETUP + SUPERVISION face washing sitting EOB      Pertinent Vitals/Pain Pain Assessment: 0-10 Pain Score: 7  Pain Location: R knee Pain Descriptors / Indicators: Aching;Sore;Tender Pain Intervention(s): Limited activity within patient's tolerance;Repositioned;Ice applied     Hand Dominance     Extremity/Trunk Assessment Upper Extremity Assessment Upper  Extremity Assessment: Overall WFL for tasks assessed   Lower Extremity Assessment Lower Extremity Assessment: Generalized weakness       Communication Communication Communication: No difficulties   Cognition Arousal/Alertness: Awake/alert Behavior During Therapy: WFL for tasks assessed/performed Overall Cognitive Status: Within Functional Limits for tasks assessed                                        General Comments  HR 122-127 t/o session. SpO2 high 90s on RA    Exercises Exercises: Other exercises Other Exercises Other Exercises: Pt educated re: OT role, DME recs, d/c recs, falls prevention, adapted dressing strategies, and polar care mgmt   Shoulder Instructions      Home Living Family/patient expects to be discharged to:: Skilled nursing facility Living Arrangements: Alone Available Help at Discharge: Family;Available PRN/intermittently Type of Home: House Home Access: Ramped entrance     Home Layout: Two level;Able to live on main level with bedroom/bathroom     Bathroom Shower/Tub: Occupational psychologist: Handicapped height     Home Equipment: Shower seat;Cane - single Barista (2 wheels);BSC/3in1          Prior Functioning/Environment Prior Level of Function : Independent/Modified Independent             Mobility Comments: Ambulatory with SPC          OT Problem List: Decreased strength;Decreased range of motion;Decreased activity tolerance;Impaired balance (sitting and/or standing);Decreased knowledge of use of DME or AE      OT Treatment/Interventions: Self-care/ADL training;Therapeutic exercise;Energy conservation;DME and/or AE instruction;Therapeutic activities;Patient/family education;Balance training    OT Goals(Current goals can be found in the care plan section) Acute Rehab OT Goals Patient Stated Goal: to return to PLOF OT Goal Formulation: With patient Time For Goal Achievement: 11/14/21 Potential to Achieve Goals: Good ADL Goals Pt Will Perform Lower Body Dressing: with min assist;sitting/lateral leans Pt Will Transfer to Toilet: squat pivot transfer;with min assist;bedside commode (c LRAD PRN) Pt Will Perform Toileting - Clothing Manipulation and hygiene: with set-up;with supervision;sitting/lateral leans  OT Frequency: Min 2X/week   Barriers to D/C: Decreased caregiver support           Co-evaluation              AM-PAC OT "6 Clicks" Daily Activity     Outcome Measure Help from another person eating meals?: None Help from another person taking care of personal grooming?: A Little Help from another person toileting, which includes using toliet, bedpan, or urinal?: A Lot Help from another person bathing (including washing, rinsing, drying)?: A Lot Help from another person to put on and taking off regular upper body clothing?: A Little Help from another person to put on and taking off regular lower body clothing?: A Lot 6 Click Score: 16   End of Session    Activity Tolerance: Patient limited by pain;Patient tolerated treatment well Patient left: in bed;with call bell/phone within reach;with bed alarm set  OT Visit Diagnosis: Other abnormalities of gait and mobility (R26.89);Muscle weakness (generalized) (M62.81)                Time: 4174-0814 OT Time Calculation (min): 20 min Charges:  OT General Charges $OT Visit: 1 Visit OT Evaluation $OT Eval Low Complexity: 1 Low OT Treatments $Self Care/Home Management : 8-22 mins  Dessie Coma, M.S. OTR/L  10/31/21, 9:59 AM  ascom [phone]

## 2021-10-31 NOTE — NC FL2 (Signed)
[place] LEVEL OF CARE SCREENING TOOL     IDENTIFICATION  Patient Name: Autumn Burton Birthdate: May 12, 1956 Sex: female Admission Date (Current Location): 10/30/2021  [facility] and [place] Number:  Engineering geologist and Address:  [facility], [address] [place], [place] [address]      Provider Number: [id number]  Attending Physician Name and Address:  Hessie Knows, MD  Relative Name and Phone Number:  Carlynn Spry [phone]    Current Level of Care: Hospital Recommended Level of Care: [place] Prior Approval Number:    Date Approved/Denied:   PASRR Number: [id number]  Discharge Plan: SNF    Current Diagnoses: Patient Active Problem List   Diagnosis Date Noted   S/P TKR (total knee replacement) using cement, right 10/30/2021   S/P TKR (total knee replacement) using cement, left 06/05/2021   Ulcer of right leg, limited to breakdown of skin ([place]) 05/11/2021   Chronic pain of both knees 11/15/2020   History of abnormal mammogram 04/06/2020   Retaining fluid 04/06/2020   Decreased hemoglobin 04/06/2020   AKI (acute kidney injury) ([place]) 04/06/2020   History of sepsis 03/01/2020   Hypokalemia 03/01/2020   History of rhabdomyolysis 03/01/2020   Rheumatoid arthritis ([place]) 03/01/2020   Peripheral neuropathy 03/01/2020   Hx of colonic polyps    Polyp of sigmoid colon    Morbidly obese ([place]) 03/06/2016   Chronic eczema 01/17/2015   Cough due to ACE inhibitor 01/17/2015   Essential (primary) hypertension 01/17/2015   Secondary osteoarthritis of right ankle 01/17/2015   Acquired lymphedema of leg 11/25/2014   Infection and inflammatory reaction due to internal orthopedic device, implant, and graft ([place]) 05/01/2010   Pseudoarthrosis of cervical spine ([place]) 01/12/2010   Hyperthyroidism, subclinical 01/12/2010   Tachycardia 01/12/2010   Nonunion of fracture 01/12/2010    Orientation RESPIRATION BLADDER  Height & Weight     Self, Time, Situation, Place  Normal Continent, External catheter Weight: 120.7 kg Height:  5\' 3"  (160 cm)  BEHAVIORAL SYMPTOMS/MOOD NEUROLOGICAL BOWEL NUTRITION STATUS      Continent Diet (regular)  AMBULATORY STATUS COMMUNICATION OF NEEDS Skin   Extensive Assist Verbally Normal, Surgical wounds                       Personal Care Assistance Level of Assistance  Bathing, Feeding, Dressing Bathing Assistance: Limited assistance Feeding assistance: Independent Dressing Assistance: Maximum assistance     Functional Limitations Info             SPECIAL CARE FACTORS FREQUENCY  PT (By licensed PT), OT (By licensed OT)     PT Frequency: 5 times per week OT Frequency: 5 times per week            Contractures Contractures Info: Not present    Additional Factors Info  Code Status, Allergies Code Status Info: full code Allergies Info: Latex           Current Medications (10/31/2021):  This is the current hospital active medication list Current Facility-Administered Medications  Medication Dose Route Frequency Provider Last Rate Last Admin   0.9 %  sodium chloride infusion   Intravenous Continuous Hessie Knows, MD 75 mL/hr at 10/30/21 1546 New Bag at 10/30/21 1546   acetaminophen (TYLENOL) tablet 325-650 mg  325-650 mg Oral Q6H PRN Hessie Knows, MD       alum & mag hydroxide-simeth (MAALOX/MYLANTA) 200-200-20 MG/5ML suspension 30 mL  30 mL Oral Q4H PRN  Hessie Knows, MD       bisacodyl (DULCOLAX) suppository 10 mg  10 mg Rectal Daily PRN Hessie Knows, MD       diphenhydrAMINE (BENADRYL) 12.5 MG/5ML elixir 12.5-25 mg  12.5-25 mg Oral Q4H PRN Hessie Knows, MD       docusate sodium (COLACE) capsule 100 mg  100 mg Oral BID Hessie Knows, MD   100 mg at 10/30/21 2116   enoxaparin (LOVENOX) injection 30 mg  30 mg Subcutaneous Q12H Hessie Knows, MD       ferrous sulfate tablet 325 mg  325 mg Oral Q breakfast Hessie Knows, MD       folic acid  (FOLVITE) tablet 1 mg  1 mg Oral Once per day on Mon Tue Wed Thu Fri Sat Hessie Knows, MD       furosemide (LASIX) tablet 20 mg  20 mg Oral Daily Hessie Knows, MD   20 mg at 10/30/21 1549   gabapentin (NEURONTIN) capsule 300 mg  300 mg Oral TID Hessie Knows, MD   300 mg at 10/30/21 2112   hydrochlorothiazide (HYDRODIURIL) tablet 25 mg  25 mg Oral Daily Hessie Knows, MD       HYDROcodone-acetaminophen (NORCO) 7.5-325 MG per tablet 1-2 tablet  1-2 tablet Oral Q4H PRN Hessie Knows, MD       HYDROcodone-acetaminophen (NORCO/VICODIN) 5-325 MG per tablet 1-2 tablet  1-2 tablet Oral Q4H PRN Hessie Knows, MD   2 tablet at 10/30/21 1834   irbesartan (AVAPRO) tablet 150 mg  150 mg Oral Daily Hessie Knows, MD       menthol-cetylpyridinium (CEPACOL) lozenge 3 mg  1 lozenge Oral PRN Hessie Knows, MD       Or   phenol (CHLORASEPTIC) mouth spray 1 spray  1 spray Mouth/Throat PRN Hessie Knows, MD       methocarbamol (ROBAXIN) tablet 500 mg  500 mg Oral Q6H PRN Hessie Knows, MD       Or   methocarbamol (ROBAXIN) 500 mg in dextrose 5 % 50 mL IVPB  500 mg Intravenous Q6H PRN Hessie Knows, MD       metoCLOPramide (REGLAN) tablet 5-10 mg  5-10 mg Oral Q8H PRN Hessie Knows, MD       Or   metoCLOPramide (REGLAN) injection 5-10 mg  5-10 mg Intravenous Q8H PRN Hessie Knows, MD       metoprolol tartrate (LOPRESSOR) tablet 25 mg  25 mg Oral Claudine Mouton, Legrand Como, MD       morphine 4 MG/ML injection 0.52-1 mg  0.52-1 mg Intravenous Q2H PRN Hessie Knows, MD   1 mg at 10/30/21 1438   ondansetron (ZOFRAN) tablet 4 mg  4 mg Oral Q6H PRN Hessie Knows, MD       Or   ondansetron [facility]) injection 4 mg  4 mg Intravenous Q6H PRN Hessie Knows, MD   4 mg at 10/30/21 1518   pantoprazole (PROTONIX) EC tablet 40 mg  40 mg Oral Daily Hessie Knows, MD   40 mg at 10/30/21 1549   polyethylene glycol (MIRALAX / GLYCOLAX) packet 17 g  17 g Oral Daily PRN Hessie Knows, MD       potassium chloride (KLOR-CON) CR tablet 10 mEq   10 mEq Oral Daily Hessie Knows, MD       sodium phosphate (FLEET) 7-19 GM/118ML enema 1 enema  1 enema Rectal Once PRN Hessie Knows, MD       traMADol Veatrice Bourbon) tablet 50 mg  50 mg Oral Q6H Menz,  Legrand Como, MD   50 mg at 10/31/21 0458   zolpidem (AMBIEN) tablet 5 mg  5 mg Oral QHS PRN Hessie Knows, MD         Discharge Medications: Please see discharge summary for a list of discharge medications.  Relevant Imaging Results:  Relevant Lab Results:   Additional Information SS# [id number]  Conception Oms, RN

## 2021-11-01 LAB — CBC
HCT: 23.5 % — ABNORMAL LOW (ref 36.0–46.0)
Hemoglobin: 7.9 g/dL — ABNORMAL LOW (ref 12.0–15.0)
MCH: 31.6 pg (ref 26.0–34.0)
MCHC: 33.6 g/dL (ref 30.0–36.0)
MCV: 94 fL (ref 80.0–100.0)
Platelets: 240 10*3/uL (ref 150–400)
RBC: 2.5 MIL/uL — ABNORMAL LOW (ref 3.87–5.11)
RDW: 15.1 % (ref 11.5–15.5)
WBC: 10.5 10*3/uL (ref 4.0–10.5)
nRBC: 0 % (ref 0.0–0.2)

## 2021-11-01 MED ORDER — TRAMADOL HCL 50 MG PO TABS: 50.0000 mg | ORAL_TABLET | Freq: Four times a day (QID) | ORAL | 0 refills | Status: DC | PRN

## 2021-11-01 MED ORDER — HYDROCODONE-ACETAMINOPHEN 5-325 MG PO TABS
ORAL_TABLET | ORAL | Status: AC
  Filled 2021-11-01: qty 2

## 2021-11-01 MED ORDER — GABAPENTIN 300 MG PO CAPS
ORAL_CAPSULE | ORAL | Status: AC
  Filled 2021-11-01: qty 1

## 2021-11-01 MED ORDER — TRAMADOL HCL 50 MG PO TABS
ORAL_TABLET | ORAL | Status: AC
  Filled 2021-11-01: qty 1

## 2021-11-01 MED ORDER — CLONIDINE HCL 0.1 MG PO TABS
0.2000 mg | ORAL_TABLET | Freq: Two times a day (BID) | ORAL | Status: DC
  Administered 2021-11-01 – 2021-11-02 (×3): 0.2 mg via ORAL
  Filled 2021-11-01 (×4): qty 2

## 2021-11-01 MED ORDER — METOPROLOL TARTRATE 25 MG PO TABS
ORAL_TABLET | ORAL | Status: AC
  Filled 2021-11-01: qty 1

## 2021-11-01 MED ORDER — POLYETHYLENE GLYCOL 3350 17 G PO PACK: 17.0000 g | PACK | Freq: Every day | ORAL | 0 refills | Status: DC | PRN

## 2021-11-01 MED ORDER — DOCUSATE SODIUM 100 MG PO CAPS
ORAL_CAPSULE | ORAL | Status: AC
  Filled 2021-11-01: qty 1

## 2021-11-01 MED ORDER — ENOXAPARIN SODIUM 40 MG/0.4ML IJ SOSY: 40.0000 mg | PREFILLED_SYRINGE | INTRAMUSCULAR | 0 refills | Status: DC

## 2021-11-01 MED ORDER — POTASSIUM CHLORIDE CRYS ER 20 MEQ PO TBCR
EXTENDED_RELEASE_TABLET | ORAL | Status: AC
  Administered 2021-11-02: 09:00:00 10 meq via ORAL
  Filled 2021-11-01: qty 1

## 2021-11-01 MED ORDER — METHOCARBAMOL 500 MG PO TABS: 500.0000 mg | ORAL_TABLET | Freq: Four times a day (QID) | ORAL | Status: AC | PRN

## 2021-11-01 MED ORDER — HYDROCODONE-ACETAMINOPHEN 7.5-325 MG PO TABS: 1.0000 | ORAL_TABLET | ORAL | 0 refills | Status: DC | PRN

## 2021-11-01 NOTE — Progress Notes (Signed)
Occupational Therapy Treatment Patient Details Name: Autumn Burton MRN: [id number] DOB: 1956-04-18 Today's Date: 11/01/2021   History of present illness Pt is a 65 y.o. female s/p R TKA secondary to OA 10/30/21.  PMH includes htn, anemia, B LE lymphedema, venous ulcer R leg/thigh, L TKA 06/05/21, h/o R leg fx surgery, L lumpectomy/partial mastectomy.   OT comments  Autumn Burton was seen for OT/PT co-treatment on this date. Upon arrival to room pt in chair, agreeable to tx. Pt requires MOD A x2 + RW for BSC t/f - pt voided in standing. MIN A + RW for perihygiene in standing. MAX A don/doff B socks seated EOB. Pt making good progress toward goals. Pt continues to benefit from skilled OT services to maximize return to PLOF and minimize risk of future falls, injury, caregiver burden, and readmission. Will continue to follow POC. Discharge recommendation remains appropriate.     Recommendations for follow up therapy are one component of a multi-disciplinary discharge planning process, led by the attending physician.  Recommendations may be updated based on patient status, additional functional criteria and insurance authorization.    Follow Up Recommendations  Skilled nursing-short term rehab (<3 hours/day)    Assistance Recommended at Discharge Intermittent Supervision/Assistance  Equipment Recommendations  Other (comment) (defer)    Recommendations for Other Services      Precautions / Restrictions Precautions Precautions: Fall;Knee Precaution Comments: wound vac; latex allergy Restrictions Weight Bearing Restrictions: Yes RLE Weight Bearing: Weight bearing as tolerated       Mobility Bed Mobility Overal bed mobility: Needs Assistance Bed Mobility: Sit to Supine     Supine to sit: Min assist;Mod assist Sit to supine: Mod assist;+2 for physical assistance   General bed mobility comments: seated in recliner beginning of session; edge of bed with OT end of session     Transfers Overall transfer level: Needs assistance Equipment used: Rolling walker (2 wheels) Transfers: Sit to/from Stand Sit to Stand: Mod assist;+2 physical assistance Stand pivot transfers: Mod assist;+2 physical assistance         General transfer comment: step by step cuing for sequence; constant cuing/assist for R TKE, L LE stepping (versus sliding) for progression towards more functional indep     Balance Overall balance assessment: Needs assistance Sitting-balance support: No upper extremity supported;Feet supported Sitting balance-Leahy Scale: Good     Standing balance support: Single extremity supported Standing balance-Leahy Scale: Poor Standing balance comment: requires MOD A of 2 people with BUE support of RW to remain standing                           ADL either performed or assessed with clinical judgement   ADL Overall ADL's : Needs assistance/impaired                                       General ADL Comments: MAX A don/doff B socks seated EOB. MOD A x2 + RW for BSC t/f - pt voided in standing. MIN A + RW for perihygiene in standing      Cognition Arousal/Alertness: Awake/alert Behavior During Therapy: WFL for tasks assessed/performed Overall Cognitive Status: Within Functional Limits for tasks assessed  Exercises Exercises: Other exercises Total Joint Exercises Goniometric ROM: R knee: 15-62 degrees Other Exercises Other Exercises: Seated UE therex and glute squeezes and abdominal crunches Other Exercises: Received with R knee in significant flexion (hip abduct/ER in bed); educated in position and importance of R knee extension in recovery process.  Patient voiced understanding; positioned in neutral with towel roll under heel at end of session.   Shoulder Instructions       General Comments      Pertinent Vitals/ Pain       Pain Assessment: 0-10 Pain  Score: 6  Faces Pain Scale: Hurts little more Pain Location: R knee at rest (increased with activity) Pain Descriptors / Indicators: Aching;Sore;Tender Pain Intervention(s): Limited activity within patient's tolerance;Repositioned         Frequency  Min 2X/week        Progress Toward Goals  OT Goals(current goals can now be found in the care plan section)  Progress towards OT goals: Progressing toward goals  Acute Rehab OT Goals Patient Stated Goal: to go to [facility] OT Goal Formulation: With patient Time For Goal Achievement: 11/14/21 Potential to Achieve Goals: Good ADL Goals Pt Will Perform Lower Body Dressing: with min assist;sitting/lateral leans Pt Will Transfer to Toilet: squat pivot transfer;with min assist;bedside commode Pt Will Perform Toileting - Clothing Manipulation and hygiene: with set-up;with supervision;sitting/lateral leans  Plan Discharge plan remains appropriate;Frequency remains appropriate    Co-evaluation    PT/OT/SLP Co-Evaluation/Treatment: Yes Reason for Co-Treatment: To address functional/ADL transfers;For patient/therapist safety;Complexity of the patient's impairments (multi-system involvement) PT goals addressed during session: Mobility/safety with mobility OT goals addressed during session: ADL's and self-care      AM-PAC OT "6 Clicks" Daily Activity     Outcome Measure   Help from another person eating meals?: None Help from another person taking care of personal grooming?: A Little Help from another person toileting, which includes using toliet, bedpan, or urinal?: A Lot Help from another person bathing (including washing, rinsing, drying)?: A Lot Help from another person to put on and taking off regular upper body clothing?: A Little Help from another person to put on and taking off regular lower body clothing?: A Lot 6 Click Score: 16    End of Session Equipment Utilized During Treatment: Rolling walker (2 wheels);Gait  belt  OT Visit Diagnosis: Other abnormalities of gait and mobility (R26.89);Muscle weakness (generalized) (M62.81)   Activity Tolerance Patient tolerated treatment well;Patient limited by pain   Patient Left in bed;with call bell/phone within reach;with bed alarm set   Nurse Communication Mobility status        Time: 5188-4166 OT Time Calculation (min): 33 min  Charges: OT General Charges $OT Visit: 1 Visit OT Treatments $Therapeutic Activity: 8-22 mins  Dessie Coma, M.S. OTR/L  11/01/21, 4:03 PM  ascom [phone]

## 2021-11-01 NOTE — Progress Notes (Signed)
Physical Therapy Treatment Patient Details Name: Autumn Burton MRN: [id number] DOB: 12-06-1955 Today's Date: 11/01/2021   History of Present Illness Pt is a 65 y.o. female s/p R TKA secondary to OA 10/30/21.  PMH includes htn, anemia, B LE lymphedema, venous ulcer R leg/thigh, L TKA 06/05/21, h/o R leg fx surgery, L lumpectomy/partial mastectomy.    PT Comments    Seen with OT for co-treat this PM. Session emphasis on functional transfers with goal of progression towards stepping (versus sliding feet). With step by step, max cuing, patient able to initiate formal advancement of bilat LEs; very effortful and deliberate with limited stance time/loading to R LE.  Continues to require +2 for optimal safety, but improving stability in standing.    Recommendations for follow up therapy are one component of a multi-disciplinary discharge planning process, led by the attending physician.  Recommendations may be updated based on patient status, additional functional criteria and insurance authorization.  Follow Up Recommendations  Skilled nursing-short term rehab (<3 hours/day)     Assistance Recommended at Discharge Frequent or constant Supervision/Assistance  Equipment Recommendations       Recommendations for Other Services       Precautions / Restrictions Precautions Precautions: Fall;Knee Precaution Comments: wound vac; latex allergy Restrictions Weight Bearing Restrictions: Yes RLE Weight Bearing: Weight bearing as tolerated     Mobility  Bed Mobility Overal bed mobility: Needs Assistance Bed Mobility: Supine to Sit     Supine to sit: Min assist;Mod assist Sit to supine: max assist +2     General bed mobility comments: extensive assist for LE management, scooting up in bed (positioned in trendelenburg)   Transfers Overall transfer level: Needs assistance Equipment used: Rolling walker (2 wheels) Transfers: Sit to/from Stand Sit to Stand: Mod assist;+2 physical  assistance Stand pivot transfers: Mod assist;+2 physical assistance         General transfer comment: step by step cuing for sequence; constant cuing/assist for R TKE, L LE stepping (versus sliding) for progression towards more functional indep    Ambulation/Gait               General Gait Details: unsafe/unable   Stairs             Wheelchair Mobility    Modified Rankin (Stroke Patients Only)       Balance Overall balance assessment: Needs assistance Sitting-balance support: No upper extremity supported;Feet supported Sitting balance-Leahy Scale: Good     Standing balance support: Bilateral upper extremity supported Standing balance-Leahy Scale: Poor Standing balance comment: requires MOD A of 2 people with BUE support of RW to remain standing                            Cognition Arousal/Alertness: Awake/alert Behavior During Therapy: WFL for tasks assessed/performed Overall Cognitive Status: Within Functional Limits for tasks assessed                                          Exercises Total Joint Exercises Goniometric ROM: R knee: 15-62 degrees Other Exercises Other Exercises: Attempted transfer to BSC (via SPT), mod assist +2 for sit/stand; min assist for static standing balance.  Noted with bladder incontinence during standing (stress incontinence); max assist for hygiene.    General Comments        Pertinent Vitals/Pain Pain Assessment: 0-10 Pain  Score: 6  Pain Location: R knee at rest (increased with activity) Pain Descriptors / Indicators: Aching;Sore;Tender Pain Intervention(s): Limited activity within patient's tolerance;Monitored during session;Repositioned;Patient requesting pain meds-RN notified;RN gave pain meds during session    Home Living                          Prior Function            PT Goals (current goals can now be found in the care plan section) Acute Rehab PT Goals Patient  Stated Goal: to improve pain and mobility PT Goal Formulation: With patient Time For Goal Achievement: 11/13/21 Potential to Achieve Goals: Good    Frequency    BID      PT Plan Current plan remains appropriate    Co-evaluation PT/OT/SLP Co-Evaluation/Treatment: Yes Reason for Co-Treatment: Complexity of the patient's impairments (multi-system involvement);For patient/therapist safety;To address functional/ADL transfers PT goals addressed during session: Mobility/safety with mobility;Balance OT goals addressed during session: ADL's and self-care      AM-PAC PT "6 Clicks" Mobility   Outcome Measure  Help needed turning from your back to your side while in a flat bed without using bedrails?: A Lot Help needed moving from lying on your back to sitting on the side of a flat bed without using bedrails?: A Lot Help needed moving to and from a bed to a chair (including a wheelchair)?: A Lot Help needed standing up from a chair using your arms (e.g., wheelchair or bedside chair)?: A Lot Help needed to walk in hospital room?: Total Help needed climbing 3-5 steps with a railing? : Total 6 Click Score: 10    End of Session Equipment Utilized During Treatment: Gait belt Activity Tolerance: Patient tolerated treatment well;Patient limited by pain Patient left: with call bell/phone within reach;in chair Nurse Communication: Mobility status;Precautions;Weight bearing status PT Visit Diagnosis: Other abnormalities of gait and mobility (R26.89);Muscle weakness (generalized) (M62.81);Pain Pain - Right/Left: Right Pain - part of body: Knee     Time: 9417-4081 PT Time Calculation (min) (ACUTE ONLY): 34 min  Charges:  $Therapeutic Exercise: 8-22 mins $Therapeutic Activity: 8-22 mins                    Azyriah Nevins H. Owens Shark, PT, DPT, NCS 11/01/21, 3:14 PM [phone]

## 2021-11-01 NOTE — Discharge Instructions (Signed)

## 2021-11-01 NOTE — Progress Notes (Signed)
Patient placed on bedpan. Trying to have a bowel movement

## 2021-11-01 NOTE — Progress Notes (Signed)
Physical Therapy Treatment Patient Details Name: Autumn Burton MRN: [id number] DOB: 1956/07/04 Today's Date: 11/01/2021   History of Present Illness Pt is a 65 y.o. female s/p R TKA secondary to OA 10/30/21.  PMH includes htn, anemia, B LE lymphedema, venous ulcer R leg/thigh, L TKA 06/05/21, h/o R leg fx surgery, L lumpectomy/partial mastectomy.    PT Comments    Very gradual mobility progression to date. Sit/stand from mildly elevated bed surface; very broad BOS with limited active use/WBing R LE.  Extensive assist and use of bilat UEs required for lift off; maintains weight shift to L LE with all standing/transfer activities.  Progressed to SPT from bed/chair-poor R LE TKE, maintains in excessive abduct/ER with very limited active WBing through extremity.  As result, tends to scoot/slide across floor vs step (due to inability to accept weight through R LE)     Recommendations for follow up therapy are one component of a multi-disciplinary discharge planning process, led by the attending physician.  Recommendations may be updated based on patient status, additional functional criteria and insurance authorization.  Follow Up Recommendations  Skilled nursing-short term rehab (<3 hours/day)     Assistance Recommended at Discharge Frequent or constant Supervision/Assistance  Equipment Recommendations       Recommendations for Other Services       Precautions / Restrictions Precautions Precautions: Fall;Knee Restrictions Weight Bearing Restrictions: Yes RLE Weight Bearing: Weight bearing as tolerated     Mobility  Bed Mobility Overal bed mobility: Needs Assistance Bed Mobility: Supine to Sit     Supine to sit: Min assist;Mod assist     General bed mobility comments: assist for R LE management and scooting towards edge of bed    Transfers Overall transfer level: Needs assistance Equipment used: Rolling walker (2 wheels) Transfers: Sit to/from Stand;Bed to  chair/wheelchair/BSC Sit to Stand: Mod assist;+2 physical assistance Stand pivot transfers: Mod assist;+2 physical assistance         General transfer comment: sit/stand from mildly elevated bed surface; very broad BOS with limited active use/WBing R LE.  Extensive assist and use of bilat UEs required for lift off; maintains weight shift to L LE with all standing/transfer activities.  Progressed to SPT from bed/chair-poor R LE TKE, maintains in excessive abduct/ER with very limited active WBing through extremity.  As result, tends to scoot/slide across floor vs step (due to inability to accept weight through R LE)    Ambulation/Gait               General Gait Details: unsafe/unable   Stairs             Wheelchair Mobility    Modified Rankin (Stroke Patients Only)       Balance Overall balance assessment: Needs assistance Sitting-balance support: Feet supported;No upper extremity supported Sitting balance-Leahy Scale: Good     Standing balance support: Bilateral upper extremity supported Standing balance-Leahy Scale: Poor                              Cognition Arousal/Alertness: Awake/alert Behavior During Therapy: WFL for tasks assessed/performed Overall Cognitive Status: Within Functional Limits for tasks assessed                                          Exercises Total Joint Exercises Goniometric ROM: R knee: 15-62 degrees  Other Exercises Other Exercises: Supine R LE therex: ankle pumps, quad sets; act assist LAQs seated edge of bed.  Unable to fully activate R quad in against-gravity position. Other Exercises: Received with R knee in significant flexion (hip abduct/ER in bed); educated in position and importance of R knee extension in recovery process.  Patient voiced understanding; positioned in neutral with towel roll under heel at end of session.    General Comments        Pertinent Vitals/Pain Pain Assessment:  0-10 Pain Score: 2  Pain Location: R knee at rest (increased with activity) Pain Descriptors / Indicators: Aching;Sore;Tender Pain Intervention(s): Limited activity within patient's tolerance;Monitored during session;Premedicated before session;Repositioned    Home Living                          Prior Function            PT Goals (current goals can now be found in the care plan section) Acute Rehab PT Goals Patient Stated Goal: to improve pain and mobility PT Goal Formulation: With patient Time For Goal Achievement: 11/13/21 Potential to Achieve Goals: Good    Frequency    BID      PT Plan      Co-evaluation              AM-PAC PT "6 Clicks" Mobility   Outcome Measure  Help needed turning from your back to your side while in a flat bed without using bedrails?: A Lot Help needed moving from lying on your back to sitting on the side of a flat bed without using bedrails?: A Lot Help needed moving to and from a bed to a chair (including a wheelchair)?: A Lot Help needed standing up from a chair using your arms (e.g., wheelchair or bedside chair)?: A Lot Help needed to walk in hospital room?: Total Help needed climbing 3-5 steps with a railing? : Total 6 Click Score: 10    End of Session Equipment Utilized During Treatment: Gait belt Activity Tolerance: Patient limited by pain Patient left: with call bell/phone within reach;in chair Nurse Communication: Mobility status;Precautions;Weight bearing status PT Visit Diagnosis: Other abnormalities of gait and mobility (R26.89);Muscle weakness (generalized) (M62.81);Pain Pain - Right/Left: Right Pain - part of body: Knee     Time: 2919-1660 PT Time Calculation (min) (ACUTE ONLY): 23 min  Charges:  $Therapeutic Exercise: 8-22 mins $Therapeutic Activity: 8-22 mins                    Kymber Kosar H. Owens Shark, PT, DPT, NCS 11/01/21, 1:52 PM [phone]

## 2021-11-01 NOTE — TOC Progression Note (Signed)
Transition of Care [facility]) - Progression Note    Patient Details  Name: ARADHANA GIN MRN: [id number] Date of Birth: 1956/10/20  Transition of Care [facility]) CM/SW [place], RN Phone Number: 11/01/2021, 3:17 PM  Clinical Narrative:    Received notification from [facility] With ins approval to go to [facility] [phone], EMS Approval [phone]        Expected Discharge Plan and Services                                                 Social Determinants of Health (SDOH) Interventions    Readmission Risk Interventions No flowsheet data found.

## 2021-11-01 NOTE — Progress Notes (Signed)
Patient heart rate elevated while resting. Patient also found to have low SpO2. Placed on 3L [place].

## 2021-11-01 NOTE — TOC Progression Note (Addendum)
Transition of Care [facility]) - Progression Note    Patient Details  Name: Autumn Burton MRN: [id number] Date of Birth: Jun 16, 1956  Transition of Care [facility]) CM/SW [place], RN Phone Number: 11/01/2021, 9:57 AM  Clinical Narrative:   Received a message from Seth Bake at [facility]  they will be able to accept the patient for STR tomorrow but will need the DC summary tomorrow by noon         Expected Discharge Plan and Services                                                 Social Determinants of Health (SDOH) Interventions    Readmission Risk Interventions No flowsheet data found.

## 2021-11-01 NOTE — Discharge Summary (Signed)
Physician Discharge Summary  Patient ID: Autumn Burton MRN: [id number] DOB/AGE: 66-19-1957 66 y.o.  Admit date: 10/30/2021 Discharge date: 11/02/2021  Admission Diagnoses:  S/P TKR (total knee replacement) using cement, right [Z96.651]   Discharge Diagnoses: Patient Active Problem List   Diagnosis Date Noted   S/P TKR (total knee replacement) using cement, right 10/30/2021   S/P TKR (total knee replacement) using cement, left 06/05/2021   Ulcer of right leg, limited to breakdown of skin ([place]) 05/11/2021   Chronic pain of both knees 11/15/2020   History of abnormal mammogram 04/06/2020   Retaining fluid 04/06/2020   Decreased hemoglobin 04/06/2020   AKI (acute kidney injury) ([place]) 04/06/2020   History of sepsis 03/01/2020   Hypokalemia 03/01/2020   History of rhabdomyolysis 03/01/2020   Rheumatoid arthritis ([place]) 03/01/2020   Peripheral neuropathy 03/01/2020   Hx of colonic polyps    Polyp of sigmoid colon    Morbidly obese ([place]) 03/06/2016   Chronic eczema 01/17/2015   Cough due to ACE inhibitor 01/17/2015   Essential (primary) hypertension 01/17/2015   Secondary osteoarthritis of right ankle 01/17/2015   Acquired lymphedema of leg 11/25/2014   Infection and inflammatory reaction due to internal orthopedic device, implant, and graft ([place]) 05/01/2010   Pseudoarthrosis of cervical spine ([place]) 01/12/2010   Hyperthyroidism, subclinical 01/12/2010   Tachycardia 01/12/2010   Nonunion of fracture 01/12/2010    Past Medical History:  Diagnosis Date   Anemia    Arthritis    GERD (gastroesophageal reflux disease)    Hypertension    Lymphedema of both lower extremities    being followed by Dr Rhoderick Moody [facility]   Venous ulcer of right leg ([place])    thigh-being followed by dr dew     Transfusion: none   Consultants (if any):   Discharged Condition: Improved  Hospital Course: Autumn Burton is an 65 y.o. female who was admitted 10/30/2021 with a diagnosis of S/P  TKR (total knee replacement) using cement, right and went to the operating room on 10/30/2021 and underwent the above named procedures.    Surgeries: Procedure(s): TOTAL KNEE ARTHROPLASTY on 10/30/2021 Patient tolerated the surgery well. Taken to PACU where she was stabilized and then transferred to the orthopedic floor.  Started on Lovenox 30 mg q 12 hrs. Foot pumps applied bilaterally at 80 mm. Heels elevated on bed with rolled towels. No evidence of DVT. Negative [place]. Physical therapy started on day #1 for gait training and transfer. OT started day #1 for ADL and assisted devices.  Patient's foley was d/c on day #1. Patient's IV was d/c on day #2.  On postop day 1 patient with acute postop blood loss anemia, hemoglobin down to 8.5.  She was started on iron supplement.  Patient noted to be hypertensive, clonidine given which seemed to improve blood pressure.  Patient's pain well controlled.  On post op day #3 patient was stable and ready for discharge to SNF.   Implants: Minto  system with 2  femur, 2 left tibia with short stem and 12 mm PS insert.  Size 1 patella, all components cemented             TOTAL KNEE REPLACEMENT POSTOPERATIVE DIRECTIONS  Knee Rehabilitation, Guidelines Following Surgery  Results after knee surgery are often greatly improved when you follow the exercise, range of motion and muscle strengthening exercises prescribed by your doctor. Safety measures are also important to protect the knee from further injury. Any time any of these exercises cause  you to have increased pain or swelling in your knee joint, decrease the amount until you are comfortable again and slowly increase them. If you have problems or questions, call your caregiver or physical therapist for advice.   HOME CARE INSTRUCTIONS  Remove items at home which could result in a fall. This includes throw rugs or furniture in walking pathways.  Continue to use polar care unit on the knee for pain and  swelling from surgery. You may notice swelling that will progress down to the foot and ankle.  This is normal after surgery.  Elevate the leg when you are not up walking on it.   Continue to use the breathing machine which will help keep your temperature down.  It is common for your temperature to cycle up and down following surgery, especially at night when you are not up moving around and exerting yourself.  The breathing machine keeps your lungs expanded and your temperature down. Do not place pillow under knee, focus on keeping the knee STRAIGHT while resting  DIET You may resume your previous home diet once your are discharged from the hospital.  DRESSING / WOUND CARE / SHOWERING Please remove provena negative pressure dressing on 11/09/2021 and apply honey comb dressing. Keep dressing clean and dry at all times.   ACTIVITY Walk with your walker as instructed. Use walker as long as suggested by your caregivers. Avoid periods of inactivity such as sitting longer than an hour when not asleep. This helps prevent blood clots.  You may resume a sexual relationship in one month or when given the OK by your doctor.  You may return to work once you are cleared by your doctor.  Do not drive a car for 6 weeks or until released by you surgeon.  Do not drive while taking narcotics.  WEIGHT BEARING Weight bearing as tolerated with assist device (walker, cane, etc) as directed, use it as long as suggested by your surgeon or therapist, typically at least 4-6 weeks.  POSTOPERATIVE CONSTIPATION PROTOCOL Constipation - defined medically as fewer than three stools per week and severe constipation as less than one stool per week.  One of the most common issues patients have following surgery is constipation.  Even if you have a regular bowel pattern at home, your normal regimen is likely to be disrupted due to multiple reasons following surgery.  Combination of anesthesia, postoperative narcotics, change  in appetite and fluid intake all can affect your bowels.  In order to avoid complications following surgery, here are some recommendations in order to help you during your recovery period.  Colace (docusate) - Pick up an over-the-counter form of Colace or another stool softener and take twice a day as long as you are requiring postoperative pain medications.  Take with a full glass of water daily.  If you experience loose stools or diarrhea, hold the colace until you stool forms back up.  If your symptoms do not get better within 1 week or if they get worse, check with your doctor.  Dulcolax (bisacodyl) - Pick up over-the-counter and take as directed by the product packaging as needed to assist with the movement of your bowels.  Take with a full glass of water.  Use this product as needed if not relieved by Colace only.   MiraLax (polyethylene glycol) - Pick up over-the-counter to have on hand.  MiraLax is a solution that will increase the amount of water in your bowels to assist with bowel movements.  Take  as directed and can mix with a glass of water, juice, soda, coffee, or tea.  Take if you go more than two days without a movement. Do not use MiraLax more than once per day. Call your doctor if you are still constipated or irregular after using this medication for 7 days in a row.  If you continue to have problems with postoperative constipation, please contact the office for further assistance and recommendations.  If you experience "the worst abdominal pain ever" or develop nausea or vomiting, please contact the office immediatly for further recommendations for treatment.  ITCHING  If you experience itching with your medications, try taking only a single pain pill, or even half a pain pill at a time.  You can also use Benadryl over the counter for itching or also to help with sleep.   TED HOSE STOCKINGS Wear the elastic stockings on both legs for six weeks following surgery during the day but you  may remove then at night for sleeping.  MEDICATIONS See your medication summary on the After Visit Summary that the nursing staff will review with you prior to discharge.  You may have some home medications which will be placed on hold until you complete the course of blood thinner medication.  It is important for you to complete the blood thinner medication as prescribed by your surgeon.  Continue your approved medications as instructed at time of discharge.  PRECAUTIONS If you experience chest pain or shortness of breath - call 911 immediately for transfer to the hospital emergency department.  If you develop a fever greater that 101 F, purulent drainage from wound, increased redness or drainage from wound, foul odor from the wound/dressing, or calf pain - CONTACT YOUR SURGEON.                                                   FOLLOW-UP APPOINTMENTS Make sure you keep all of your appointments after your operation with your surgeon and caregivers. You should call the office at the above phone number and make an appointment for approximately two weeks after the date of your surgery or on the date instructed by your surgeon outlined in the "After Visit Summary".   RANGE OF MOTION AND STRENGTHENING EXERCISES  Rehabilitation of the knee is important following a knee injury or an operation. After just a few days of immobilization, the muscles of the thigh which control the knee become weakened and shrink (atrophy). Knee exercises are designed to build up the tone and strength of the thigh muscles and to improve knee motion. Often times heat used for twenty to thirty minutes before working out will loosen up your tissues and help with improving the range of motion but do not use heat for the first two weeks following surgery. These exercises can be done on a training (exercise) mat, on the floor, on a table or on a bed. Use what ever works the best and is most comfortable for you Knee exercises include:   Leg Lifts - While your knee is still immobilized in a splint or cast, you can do straight leg raises. Lift the leg to 60 degrees, hold for 3 sec, and slowly lower the leg. Repeat 10-20 times 2-3 times daily. Perform this exercise against resistance later as your knee gets better.  Quad and Hamstring Sets - Tighten  up the muscle on the front of the thigh (Quad) and hold for 5-10 sec. Repeat this 10-20 times hourly. Hamstring sets are done by pushing the foot backward against an object and holding for 5-10 sec. Repeat as with quad sets.  Leg Slides: Lying on your back, slowly slide your foot toward your buttocks, bending your knee up off the floor (only go as far as is comfortable). Then slowly slide your foot back down until your leg is flat on the floor again. Angel Wings: Lying on your back spread your legs to the side as far apart as you can without causing discomfort.  A rehabilitation program following serious knee injuries can speed recovery and prevent re-injury in the future due to weakened muscles. Contact your doctor or a physical therapist for more information on knee rehabilitation.   IF YOU ARE TRANSFERRED TO A SKILLED REHAB FACILITY If the patient is transferred to a skilled rehab facility following release from the hospital, a list of the current medications will be sent to the facility for the patient to continue.  When discharged from the skilled rehab facility, please have the facility set up the patient's [place] prior to being released. Also, the skilled facility will be responsible for providing the patient with their medications at time of release from the facility to include their pain medication, the muscle relaxants, and their blood thinner medication. If the patient is still at the rehab facility at time of the two week follow up appointment, the skilled rehab facility will also need to assist the patient in arranging follow up appointment in our office and any  transportation needs.  MAKE SURE YOU:  Understand these instructions.  Get help right away if you are not doing well or get worse.                                She was given perioperative antibiotics:  Anti-infectives (From admission, onward)    Start     Dose/Rate Route Frequency Ordered Stop   10/30/21 2058  ceFAZolin (ANCEF) 2-4 GM/100ML-% IVPB       Note to Pharmacy: Callie Fielding C: cabinet override      10/30/21 2058 10/30/21 2115   10/30/21 1500  ceFAZolin (ANCEF) IVPB 2g/100 mL premix        2 g 200 mL/hr over 30 Minutes Intravenous Every 6 hours 10/30/21 1418 10/30/21 2142   10/30/21 1012  vancomycin (VANCOCIN) powder  Status:  Discontinued          As needed 10/30/21 1013 10/30/21 1119   10/30/21 0600  ceFAZolin (ANCEF) IVPB 3g/100 mL premix        3 g 200 mL/hr over 30 Minutes Intravenous On call to O.R. 10/30/21 0101 10/30/21 1320     .  She was given sequential compression devices, early ambulation, and Lovenox for DVT prophylaxis.  She benefited maximally from the hospital stay and there were no complications.    Recent vital signs:  Vitals:   11/02/21 0534 11/02/21 0815  BP: 122/68   Pulse: 100   Resp:    Temp: (!) 96.9 F (36.1 C) 97.9 F (36.6 C)  SpO2: 100%     Recent laboratory studies:  Lab Results  Component Value Date   HGB 7.9 (L) 11/01/2021   HGB 8.5 (L) 10/31/2021   HGB 9.5 (L) 10/30/2021   Lab Results  Component Value Date  WBC 10.5 11/01/2021   PLT 240 11/01/2021   No results found for: INR Lab Results  Component Value Date   NA 133 (L) 10/31/2021   K 4.5 10/31/2021   CL 104 10/31/2021   CO2 24 10/31/2021   BUN 25 (H) 10/31/2021   CREATININE 1.16 (H) 10/31/2021   GLUCOSE 175 (H) 10/31/2021    Discharge Medications:   Allergies as of 11/02/2021       Reactions   Latex Hives, Itching, Rash   Redness        Medication List     STOP taking these medications    acetaminophen 500 MG  tablet Commonly known as: TYLENOL   HYDROcodone-acetaminophen 5-325 MG tablet Commonly known as: NORCO/VICODIN Replaced by: HYDROcodone-acetaminophen 7.5-325 MG tablet       TAKE these medications    calcium carbonate 500 MG chewable tablet Commonly known as: TUMS - dosed in mg elemental calcium Chew 1 tablet by mouth daily as needed for indigestion or heartburn.   cholecalciferol 25 MCG (1000 UNIT) tablet Commonly known as: VITAMIN D3 Take 1,000 Units by mouth daily.   docusate sodium 100 MG capsule Commonly known as: COLACE Take 1 capsule (100 mg total) by mouth 2 (two) times daily.   enoxaparin 40 MG/0.4ML injection Commonly known as: LOVENOX Inject 0.4 mLs (40 mg total) into the skin daily for 14 days.   ferrous sulfate 325 (65 FE) MG tablet Take 325 mg by mouth daily with breakfast.   folic acid 1 MG tablet Commonly known as: FOLVITE Take 1 mg by mouth See admin instructions. Take 1 mg by mouth daily except for Sunday   furosemide 20 MG tablet Commonly known as: LASIX Take 1 tablet (20 mg total) by mouth daily.   gabapentin 300 MG capsule Commonly known as: NEURONTIN Take 300 mg by mouth 3 (three) times daily.   halobetasol 0.05 % ointment Commonly known as: ULTRAVATE Apply 1 application topically 2 (two) times daily as needed (irritation).   hydrochlorothiazide 25 MG tablet Commonly known as: HYDRODIURIL Take 1 tablet (25 mg total) by mouth daily.   HYDROcodone-acetaminophen 7.5-325 MG tablet Commonly known as: NORCO Take 1-2 tablets by mouth every 4 (four) hours as needed for severe pain (pain score 7-10). Replaces: HYDROcodone-acetaminophen 5-325 MG tablet   methocarbamol 500 MG tablet Commonly known as: ROBAXIN Take 1 tablet (500 mg total) by mouth every 6 (six) hours as needed for muscle spasms.   methotrexate 2.5 MG tablet Take 15 mg by mouth every Sunday.   metoprolol tartrate 25 MG tablet Commonly known as: LOPRESSOR Take 1 tablet (25 mg  total) by mouth daily. What changed: when to take this   olmesartan 20 MG tablet Commonly known as: BENICAR Take 1 tablet (20 mg total) by mouth daily. What changed: when to take this   polyethylene glycol 17 g packet Commonly known as: MIRALAX / GLYCOLAX Take 17 g by mouth daily as needed for mild constipation.   potassium chloride 10 MEQ tablet Commonly known as: KLOR-CON TAKE 1 TABLET BY MOUTH ONCE DAILY. TAKE WITH FUROSEMIDE. What changed:  how much to take how to take this when to take this additional instructions   traMADol 50 MG tablet Commonly known as: ULTRAM Take 1 tablet (50 mg total) by mouth every 6 (six) hours as needed.   triamcinolone ointment 0.1 % Commonly known as: KENALOG Apply 1 application topically daily as needed (eczema flares).   vitamin B-12 1000 MCG tablet Commonly known as: CYANOCOBALAMIN  Take 1,000 mcg by mouth daily.               Durable Medical Equipment  (From admission, onward)           Start     Ordered   10/30/21 1419  DME Walker rolling  Once       Comments: Bariatric  Question Answer Comment  Walker: With [place] Wheels   Patient needs a walker to treat with the following condition S/P TKR (total knee replacement) using cement, right      10/30/21 1418   10/30/21 1419  DME 3 n 1  Once        10/30/21 1418   10/30/21 1419  DME Bedside commode  Once       Comments: Bariatric  Question:  Patient needs a bedside commode to treat with the following condition  Answer:  S/P TKR (total knee replacement) using cement, right   10/30/21 1418            Diagnostic Studies: DG Knee 1-2 Views Right  Result Date: 10/30/2021 CLINICAL DATA:  Postop, knee replacement EXAM: RIGHT KNEE - 1-2 VIEW COMPARISON:  None. FINDINGS: Status post right knee total arthroplasty with antibiotic packing beads and expected overlying postoperative change. No evidence of perihardware fracture or component malpositioning. IMPRESSION: Status  post right knee total arthroplasty with antibiotic packing beads and expected overlying postoperative change. No evidence of perihardware fracture or component malpositioning. Electronically Signed   By: Delanna Ahmadi M.D.   On: 10/30/2021 12:35    Disposition:      Contact information for follow-up providers     Duanne Guess, PA-C Follow up in 2 week(s).   Specialties: Orthopedic Surgery, Emergency Medicine Contact information: [place] [address] [phone]              Contact information for after-discharge care     Destination     [facility] .   Service: Skilled Nursing Contact information: [place] [phone]                      Signed: Feliberto Gottron 11/02/2021, 9:22 AM

## 2021-11-01 NOTE — Progress Notes (Addendum)
° °  Subjective: 2 Days Post-Op Procedure(s) (LRB): TOTAL KNEE ARTHROPLASTY (Right) Patient reports pain as mild. Patient noted to have hypertension, started on clonidine, blood pressure improving Patient is well, and has had no acute complaints or problems Denies any CP, SOB, ABD pain.  No dizziness or lightheadedness.  No palpitations.  She is passing gas We will continue therapy today.  Plan is to go Skilled nursing facility after hospital stay.  Objective: Vital signs in last 24 hours: Temp:  [97.3 F (36.3 C)-98.9 F (37.2 C)] 97.8 F (36.6 C) (12/22 0837) Pulse Rate:  [72-137] 120 (12/22 0837) Resp:  [14-20] 20 (12/22 0837) BP: (141-186)/(68-93) 159/71 (12/22 0837) SpO2:  [85 %-100 %] 100 % (12/22 0837)  Intake/Output from previous day: 12/21 0701 - 12/22 0700 In: 500 [IV Piggyback:500] Out: 1200 [Urine:500; Drains:700] Intake/Output this shift: Total I/O In: 240 [P.O.:240] Out: -   Recent Labs    10/30/21 0750 10/30/21 0843 10/31/21 0421  HGB 10.1* 9.5* 8.5*   Recent Labs    10/30/21 0843 10/31/21 0421  WBC 6.9 8.6  RBC 3.04* 2.79*  HCT 29.7* 26.7*  PLT 271 253   Recent Labs    10/30/21 0843 10/31/21 0421  NA  --  133*  K  --  4.5  CL  --  104  CO2  --  24  BUN  --  25*  CREATININE 1.21* 1.16*  GLUCOSE  --  175*  CALCIUM  --  8.6*   No results for input(s): LABPT, INR in the last 72 hours.  EXAM General - Patient is Alert, Appropriate, and Oriented Extremity - Neurovascular intact Sensation intact distally Intact pulses distally Dorsiflexion/Plantar flexion intact Abdomen: Soft nontender nondistended Dressing - dressing C/D/I and no drainage, provena intact with 250 cc drainage, stable Motor Function - intact, moving foot and toes well on exam.   Past Medical History:  Diagnosis Date   Anemia    Arthritis    GERD (gastroesophageal reflux disease)    Hypertension    Lymphedema of both lower extremities    being followed by Dr Rhoderick Moody  [facility]   Venous ulcer of right leg ([place])    thigh-being followed by dr dew    Assessment/Plan:   2 Days Post-Op Procedure(s) (LRB): TOTAL KNEE ARTHROPLASTY (Right) Principal Problem:   S/P TKR (total knee replacement) using cement, right  Estimated body mass index is 47.14 kg/m as calculated from the following:   Height as of this encounter: 5\' 3"  (1.6 m).   Weight as of this encounter: 120.7 kg. Advance diet Up with therapy Pain controlled Labs stable Acute postop blood loss anemia-hemoglobin 8.5 yesterday.  CBC pending this morning.  Continue with iron supplement hypertension -stable, continue with as needed clonidine Tachycardia -slowly improving continue with gentle IV fluid hydration.  We will recheck CBC.  No chest pain/palpitations or shortness of breath. Continue to monitor HR Work on BM CM to assist with discharge to skilled nursing facility tomorrow  DVT Prophylaxis - Lovenox, Foot Pumps, and TED hose Weight-Bearing as tolerated to right leg   T. Rachelle Hora, PA-C [place] 11/01/2021, 10:59 AM

## 2021-11-02 DIAGNOSIS — M6281 Muscle weakness (generalized): Secondary | ICD-10-CM | POA: Diagnosis not present

## 2021-11-02 DIAGNOSIS — I10 Essential (primary) hypertension: Secondary | ICD-10-CM | POA: Diagnosis not present

## 2021-11-02 DIAGNOSIS — M069 Rheumatoid arthritis, unspecified: Secondary | ICD-10-CM | POA: Diagnosis not present

## 2021-11-02 DIAGNOSIS — M1711 Unilateral primary osteoarthritis, right knee: Secondary | ICD-10-CM | POA: Diagnosis not present

## 2021-11-02 DIAGNOSIS — E1142 Type 2 diabetes mellitus with diabetic polyneuropathy: Secondary | ICD-10-CM | POA: Diagnosis not present

## 2021-11-02 DIAGNOSIS — R2689 Other abnormalities of gait and mobility: Secondary | ICD-10-CM | POA: Diagnosis not present

## 2021-11-02 DIAGNOSIS — M25561 Pain in right knee: Secondary | ICD-10-CM | POA: Diagnosis not present

## 2021-11-02 DIAGNOSIS — Z96652 Presence of left artificial knee joint: Secondary | ICD-10-CM | POA: Diagnosis not present

## 2021-11-02 DIAGNOSIS — R262 Difficulty in walking, not elsewhere classified: Secondary | ICD-10-CM | POA: Diagnosis not present

## 2021-11-02 DIAGNOSIS — R69 Illness, unspecified: Secondary | ICD-10-CM | POA: Diagnosis not present

## 2021-11-02 DIAGNOSIS — K219 Gastro-esophageal reflux disease without esophagitis: Secondary | ICD-10-CM | POA: Diagnosis not present

## 2021-11-02 DIAGNOSIS — R5381 Other malaise: Secondary | ICD-10-CM | POA: Diagnosis not present

## 2021-11-02 DIAGNOSIS — R278 Other lack of coordination: Secondary | ICD-10-CM | POA: Diagnosis not present

## 2021-11-02 DIAGNOSIS — M1991 Primary osteoarthritis, unspecified site: Secondary | ICD-10-CM | POA: Diagnosis not present

## 2021-11-02 DIAGNOSIS — M1712 Unilateral primary osteoarthritis, left knee: Secondary | ICD-10-CM | POA: Diagnosis not present

## 2021-11-02 DIAGNOSIS — E1159 Type 2 diabetes mellitus with other circulatory complications: Secondary | ICD-10-CM | POA: Diagnosis not present

## 2021-11-02 DIAGNOSIS — Z96651 Presence of right artificial knee joint: Secondary | ICD-10-CM | POA: Diagnosis not present

## 2021-11-02 DIAGNOSIS — Z743 Need for continuous supervision: Secondary | ICD-10-CM | POA: Diagnosis not present

## 2021-11-02 DIAGNOSIS — I890 Lymphedema, not elsewhere classified: Secondary | ICD-10-CM | POA: Diagnosis not present

## 2021-11-02 DIAGNOSIS — Z471 Aftercare following joint replacement surgery: Secondary | ICD-10-CM | POA: Diagnosis not present

## 2021-11-02 DIAGNOSIS — G629 Polyneuropathy, unspecified: Secondary | ICD-10-CM | POA: Diagnosis not present

## 2021-11-02 MED ORDER — TRAMADOL HCL 50 MG PO TABS
ORAL_TABLET | ORAL | Status: AC
  Filled 2021-11-02: qty 1

## 2021-11-02 MED ORDER — PREGABALIN 75 MG PO CAPS
ORAL_CAPSULE | ORAL | Status: AC
  Filled 2021-11-02: qty 1

## 2021-11-02 MED ORDER — GABAPENTIN 300 MG PO CAPS
ORAL_CAPSULE | ORAL | Status: AC
  Filled 2021-11-02: qty 1

## 2021-11-02 MED ORDER — DOCUSATE SODIUM 100 MG PO CAPS
ORAL_CAPSULE | ORAL | Status: AC
  Filled 2021-11-02: qty 1

## 2021-11-02 MED ORDER — METOPROLOL TARTRATE 25 MG PO TABS
ORAL_TABLET | ORAL | Status: AC
  Filled 2021-11-02: qty 1

## 2021-11-02 NOTE — Progress Notes (Signed)
Discharge order received. Patient mental status is at baseline. Vital signs stable . No signs of acute distress. Discharge instructions given to [facility].  No other issues noted at this time. Patient was picked by EMS. No other issues noted.

## 2021-11-02 NOTE — TOC Progression Note (Signed)
Transition of Care [facility]) - Progression Note    Patient Details  Name: CARELI Burton MRN: [id number] Date of Birth: Sep 22, 1956  Transition of Care [facility]) CM/SW [place], RN Phone Number: 11/02/2021, 11:02 AM  Clinical Narrative:   Called EMS to transport the patient to [facility] room 107, Nurse aware, The patient will notify the family         Expected Discharge Plan and Services           Expected Discharge Date: 11/02/21                                     Social Determinants of Health (SDOH) Interventions    Readmission Risk Interventions No flowsheet data found.

## 2021-11-02 NOTE — Progress Notes (Signed)
° °  Subjective: 3 Days Post-Op Procedure(s) (LRB): TOTAL KNEE ARTHROPLASTY (Right) Patient reports pain as mild. Patient is well, and has had no acute complaints or problems Denies any CP, SOB, ABD pain.  No dizziness or lightheadedness.  No palpitations.  She is passing gas We will continue therapy today.  Plan is to go Skilled nursing facility after hospital stay.  Objective: Vital signs in last 24 hours: Temp:  [96.9 F (36.1 C)-98.2 F (36.8 C)] 97.9 F (36.6 C) (12/23 0815) Pulse Rate:  [100-101] 100 (12/23 0534) BP: (93-145)/(47-85) 122/68 (12/23 0534) SpO2:  [92 %-100 %] 100 % (12/23 0534)  Intake/Output from previous day: 12/22 0701 - 12/23 0700 In: 480 [P.O.:480] Out: 400 [Urine:400] Intake/Output this shift: No intake/output data recorded.  Recent Labs    10/31/21 0421 11/01/21 1346  HGB 8.5* 7.9*   Recent Labs    10/31/21 0421 11/01/21 1346  WBC 8.6 10.5  RBC 2.79* 2.50*  HCT 26.7* 23.5*  PLT 253 240   Recent Labs    10/31/21 0421  NA 133*  K 4.5  CL 104  CO2 24  BUN 25*  CREATININE 1.16*  GLUCOSE 175*  CALCIUM 8.6*   No results for input(s): LABPT, INR in the last 72 hours.  EXAM General - Patient is Alert, Appropriate, and Oriented Extremity - Neurovascular intact Sensation intact distally Intact pulses distally Dorsiflexion/Plantar flexion intact Abdomen: Soft nontender nondistended Dressing - dressing C/D/I and no drainage, provena intact with 250 cc drainage, stable Motor Function - intact, moving foot and toes well on exam.   Past Medical History:  Diagnosis Date   Anemia    Arthritis    GERD (gastroesophageal reflux disease)    Hypertension    Lymphedema of both lower extremities    being followed by Dr Rhoderick Moody Boots bil   Venous ulcer of right leg ([place])    thigh-being followed by dr dew    Assessment/Plan:   3 Days Post-Op Procedure(s) (LRB): TOTAL KNEE ARTHROPLASTY (Right) Principal Problem:   S/P TKR (total knee  replacement) using cement, right  Estimated body mass index is 47.14 kg/m as calculated from the following:   Height as of this encounter: 5\' 3"  (1.6 m).   Weight as of this encounter: 120.7 kg. Advance diet Up with therapy Pain controlled Labs stable Acute postop blood loss anemia- Continue with iron supplement  hypertension -stable, continue with as needed clonidine Tachycardia -improving Work on BM CM to assist with discharge to skilled nursing facility today  DVT Prophylaxis - Lovenox, Foot Pumps, and TED hose Weight-Bearing as tolerated to right leg   T. Rachelle Hora, PA-C [place] 11/02/2021, 9:19 AM

## 2021-11-02 NOTE — Progress Notes (Signed)
Report given to Lamar at [facility].Questions answered. Awaiting for EMS

## 2021-11-05 DIAGNOSIS — M25561 Pain in right knee: Secondary | ICD-10-CM | POA: Diagnosis not present

## 2021-11-05 DIAGNOSIS — Z96651 Presence of right artificial knee joint: Secondary | ICD-10-CM | POA: Diagnosis not present

## 2021-11-07 ENCOUNTER — Other Ambulatory Visit: Payer: Self-pay | Admitting: *Deleted

## 2021-11-07 DIAGNOSIS — D631 Anemia in chronic kidney disease: Secondary | ICD-10-CM

## 2021-11-07 NOTE — Anesthesia Postprocedure Evaluation (Signed)
Anesthesia Post Note  Patient: Autumn Burton  Procedure(s) Performed: TOTAL KNEE ARTHROPLASTY (Right: Knee)  Patient location during evaluation: PACU Anesthesia Type: General Level of consciousness: awake and alert Pain management: pain level controlled Vital Signs Assessment: post-procedure vital signs reviewed and stable Respiratory status: spontaneous breathing, nonlabored ventilation, respiratory function stable and patient connected to nasal cannula oxygen Cardiovascular status: blood pressure returned to baseline and stable Postop Assessment: no apparent nausea or vomiting Anesthetic complications: no   No notable events documented.   Last Vitals:  Vitals:   11/02/21 0534 11/02/21 0815  BP: 122/68   Pulse: 100   Resp:    Temp: (!) 36.1 C 36.6 C  SpO2: 100%     Last Pain:  Vitals:   11/02/21 0815  TempSrc: Oral  PainSc:                  Molli Barrows

## 2021-11-14 ENCOUNTER — Telehealth: Payer: Self-pay | Admitting: Oncology

## 2021-11-14 NOTE — Telephone Encounter (Signed)
Pt called to see if she needed to come to her appt on 1-6. She had a knee replacement and is in rehab. Wants to know if it could be put off till next week? Call back at [phone].

## 2021-11-14 NOTE — Telephone Encounter (Signed)
Spoke with patient. She will call me back this afternoon about when her ortho f/u will be because she would like to coordinate all her appointments.  Bonita--you can transfer her to me if you like, today I'm at [phone]

## 2021-11-15 DIAGNOSIS — M1711 Unilateral primary osteoarthritis, right knee: Secondary | ICD-10-CM | POA: Diagnosis not present

## 2021-11-15 DIAGNOSIS — I10 Essential (primary) hypertension: Secondary | ICD-10-CM | POA: Diagnosis not present

## 2021-11-15 DIAGNOSIS — I890 Lymphedema, not elsewhere classified: Secondary | ICD-10-CM | POA: Diagnosis not present

## 2021-11-15 DIAGNOSIS — E1159 Type 2 diabetes mellitus with other circulatory complications: Secondary | ICD-10-CM | POA: Diagnosis not present

## 2021-11-16 ENCOUNTER — Inpatient Hospital Stay: Payer: PPO

## 2021-11-16 ENCOUNTER — Inpatient Hospital Stay: Payer: PPO | Admitting: Oncology

## 2021-11-20 DIAGNOSIS — E213 Hyperparathyroidism, unspecified: Secondary | ICD-10-CM | POA: Diagnosis not present

## 2021-11-20 DIAGNOSIS — G43909 Migraine, unspecified, not intractable, without status migrainosus: Secondary | ICD-10-CM | POA: Diagnosis not present

## 2021-11-20 DIAGNOSIS — Z96652 Presence of left artificial knee joint: Secondary | ICD-10-CM | POA: Diagnosis not present

## 2021-11-20 DIAGNOSIS — Z6841 Body Mass Index (BMI) 40.0 and over, adult: Secondary | ICD-10-CM | POA: Diagnosis not present

## 2021-11-20 DIAGNOSIS — Z9181 History of falling: Secondary | ICD-10-CM | POA: Diagnosis not present

## 2021-11-20 DIAGNOSIS — G8929 Other chronic pain: Secondary | ICD-10-CM | POA: Diagnosis not present

## 2021-11-20 DIAGNOSIS — D649 Anemia, unspecified: Secondary | ICD-10-CM | POA: Diagnosis not present

## 2021-11-20 DIAGNOSIS — Z471 Aftercare following joint replacement surgery: Secondary | ICD-10-CM | POA: Diagnosis not present

## 2021-11-20 DIAGNOSIS — Z96651 Presence of right artificial knee joint: Secondary | ICD-10-CM | POA: Diagnosis not present

## 2021-11-20 DIAGNOSIS — E1142 Type 2 diabetes mellitus with diabetic polyneuropathy: Secondary | ICD-10-CM | POA: Diagnosis not present

## 2021-11-20 DIAGNOSIS — Z86010 Personal history of colonic polyps: Secondary | ICD-10-CM | POA: Diagnosis not present

## 2021-11-20 DIAGNOSIS — M069 Rheumatoid arthritis, unspecified: Secondary | ICD-10-CM | POA: Diagnosis not present

## 2021-11-20 DIAGNOSIS — I10 Essential (primary) hypertension: Secondary | ICD-10-CM | POA: Diagnosis not present

## 2021-11-20 DIAGNOSIS — M109 Gout, unspecified: Secondary | ICD-10-CM | POA: Diagnosis not present

## 2021-11-20 DIAGNOSIS — I890 Lymphedema, not elsewhere classified: Secondary | ICD-10-CM | POA: Diagnosis not present

## 2021-11-20 DIAGNOSIS — K219 Gastro-esophageal reflux disease without esophagitis: Secondary | ICD-10-CM | POA: Diagnosis not present

## 2021-11-20 DIAGNOSIS — L309 Dermatitis, unspecified: Secondary | ICD-10-CM | POA: Diagnosis not present

## 2021-11-23 DIAGNOSIS — I10 Essential (primary) hypertension: Secondary | ICD-10-CM | POA: Diagnosis not present

## 2021-11-23 DIAGNOSIS — G8929 Other chronic pain: Secondary | ICD-10-CM | POA: Diagnosis not present

## 2021-11-23 DIAGNOSIS — Z86010 Personal history of colonic polyps: Secondary | ICD-10-CM | POA: Diagnosis not present

## 2021-11-23 DIAGNOSIS — M069 Rheumatoid arthritis, unspecified: Secondary | ICD-10-CM | POA: Diagnosis not present

## 2021-11-23 DIAGNOSIS — Z6841 Body Mass Index (BMI) 40.0 and over, adult: Secondary | ICD-10-CM | POA: Diagnosis not present

## 2021-11-23 DIAGNOSIS — I890 Lymphedema, not elsewhere classified: Secondary | ICD-10-CM | POA: Diagnosis not present

## 2021-11-23 DIAGNOSIS — E1142 Type 2 diabetes mellitus with diabetic polyneuropathy: Secondary | ICD-10-CM | POA: Diagnosis not present

## 2021-11-23 DIAGNOSIS — M109 Gout, unspecified: Secondary | ICD-10-CM | POA: Diagnosis not present

## 2021-11-23 DIAGNOSIS — Z9181 History of falling: Secondary | ICD-10-CM | POA: Diagnosis not present

## 2021-11-23 DIAGNOSIS — K219 Gastro-esophageal reflux disease without esophagitis: Secondary | ICD-10-CM | POA: Diagnosis not present

## 2021-11-23 DIAGNOSIS — D649 Anemia, unspecified: Secondary | ICD-10-CM | POA: Diagnosis not present

## 2021-11-23 DIAGNOSIS — Z96651 Presence of right artificial knee joint: Secondary | ICD-10-CM | POA: Diagnosis not present

## 2021-11-23 DIAGNOSIS — Z96652 Presence of left artificial knee joint: Secondary | ICD-10-CM | POA: Diagnosis not present

## 2021-11-23 DIAGNOSIS — L309 Dermatitis, unspecified: Secondary | ICD-10-CM | POA: Diagnosis not present

## 2021-11-23 DIAGNOSIS — E213 Hyperparathyroidism, unspecified: Secondary | ICD-10-CM | POA: Diagnosis not present

## 2021-11-23 DIAGNOSIS — Z471 Aftercare following joint replacement surgery: Secondary | ICD-10-CM | POA: Diagnosis not present

## 2021-11-23 DIAGNOSIS — G43909 Migraine, unspecified, not intractable, without status migrainosus: Secondary | ICD-10-CM | POA: Diagnosis not present

## 2021-12-07 ENCOUNTER — Other Ambulatory Visit: Payer: Self-pay | Admitting: *Deleted

## 2021-12-07 ENCOUNTER — Telehealth: Payer: Self-pay | Admitting: Nurse Practitioner

## 2021-12-07 DIAGNOSIS — I10 Essential (primary) hypertension: Secondary | ICD-10-CM | POA: Diagnosis not present

## 2021-12-07 DIAGNOSIS — Z471 Aftercare following joint replacement surgery: Secondary | ICD-10-CM | POA: Diagnosis not present

## 2021-12-07 DIAGNOSIS — Z96651 Presence of right artificial knee joint: Secondary | ICD-10-CM | POA: Diagnosis not present

## 2021-12-07 DIAGNOSIS — E213 Hyperparathyroidism, unspecified: Secondary | ICD-10-CM | POA: Diagnosis not present

## 2021-12-07 DIAGNOSIS — D649 Anemia, unspecified: Secondary | ICD-10-CM | POA: Diagnosis not present

## 2021-12-07 DIAGNOSIS — E1142 Type 2 diabetes mellitus with diabetic polyneuropathy: Secondary | ICD-10-CM | POA: Diagnosis not present

## 2021-12-07 DIAGNOSIS — I890 Lymphedema, not elsewhere classified: Secondary | ICD-10-CM | POA: Diagnosis not present

## 2021-12-07 NOTE — Telephone Encounter (Signed)
Pt has lab appt on 1-31 and she only wants to stuck once. She has been stuck so many times before. Judeen Hammans could you give her a call at [phone]

## 2021-12-07 NOTE — Telephone Encounter (Signed)
Called pt back and she tells me that she has labs next week here and she has labs for [facility] and wants to get both of the labs on the same day since she is so hard to stick. She got the fax number and she will get [facility] to send over the labs they need and I will check with Janese Banks about all labs being done here. Fax [phone]

## 2021-12-10 ENCOUNTER — Other Ambulatory Visit: Payer: Self-pay | Admitting: *Deleted

## 2021-12-10 ENCOUNTER — Telehealth: Payer: Self-pay | Admitting: Oncology

## 2021-12-10 DIAGNOSIS — D631 Anemia in chronic kidney disease: Secondary | ICD-10-CM

## 2021-12-10 NOTE — Telephone Encounter (Signed)
Spoke with pt and advised per Judeen Hammans that lab orders have been received. Confirmed her lab appointment for tomorrow as well as appointment with Lauren after.

## 2021-12-10 NOTE — Telephone Encounter (Signed)
Pt called to make sure that Judeen Hammans got the fax for her labs from [place] at [facility]. Diane can be reached at [phone]. Give pt a call back at [phone]

## 2021-12-11 ENCOUNTER — Inpatient Hospital Stay (HOSPITAL_BASED_OUTPATIENT_CLINIC_OR_DEPARTMENT_OTHER): Payer: PPO | Admitting: Nurse Practitioner

## 2021-12-11 ENCOUNTER — Other Ambulatory Visit: Payer: Self-pay

## 2021-12-11 ENCOUNTER — Inpatient Hospital Stay: Payer: PPO | Attending: Oncology

## 2021-12-11 VITALS — BP 148/66 | HR 91 | Temp 96.3°F | Resp 18 | Wt 257.0 lb

## 2021-12-11 DIAGNOSIS — D631 Anemia in chronic kidney disease: Secondary | ICD-10-CM

## 2021-12-11 DIAGNOSIS — N183 Chronic kidney disease, stage 3 unspecified: Secondary | ICD-10-CM

## 2021-12-11 DIAGNOSIS — Z96652 Presence of left artificial knee joint: Secondary | ICD-10-CM | POA: Diagnosis not present

## 2021-12-11 DIAGNOSIS — Z9181 History of falling: Secondary | ICD-10-CM | POA: Diagnosis not present

## 2021-12-11 DIAGNOSIS — E538 Deficiency of other specified B group vitamins: Secondary | ICD-10-CM

## 2021-12-11 DIAGNOSIS — E1142 Type 2 diabetes mellitus with diabetic polyneuropathy: Secondary | ICD-10-CM | POA: Diagnosis not present

## 2021-12-11 DIAGNOSIS — Z96653 Presence of artificial knee joint, bilateral: Secondary | ICD-10-CM | POA: Insufficient documentation

## 2021-12-11 DIAGNOSIS — G8929 Other chronic pain: Secondary | ICD-10-CM | POA: Diagnosis not present

## 2021-12-11 DIAGNOSIS — M069 Rheumatoid arthritis, unspecified: Secondary | ICD-10-CM | POA: Diagnosis not present

## 2021-12-11 DIAGNOSIS — K219 Gastro-esophageal reflux disease without esophagitis: Secondary | ICD-10-CM | POA: Diagnosis not present

## 2021-12-11 DIAGNOSIS — Z96651 Presence of right artificial knee joint: Secondary | ICD-10-CM | POA: Diagnosis not present

## 2021-12-11 DIAGNOSIS — E213 Hyperparathyroidism, unspecified: Secondary | ICD-10-CM | POA: Diagnosis not present

## 2021-12-11 DIAGNOSIS — Z6841 Body Mass Index (BMI) 40.0 and over, adult: Secondary | ICD-10-CM | POA: Diagnosis not present

## 2021-12-11 DIAGNOSIS — G43909 Migraine, unspecified, not intractable, without status migrainosus: Secondary | ICD-10-CM | POA: Diagnosis not present

## 2021-12-11 DIAGNOSIS — Z86010 Personal history of colonic polyps: Secondary | ICD-10-CM | POA: Diagnosis not present

## 2021-12-11 DIAGNOSIS — I10 Essential (primary) hypertension: Secondary | ICD-10-CM | POA: Diagnosis not present

## 2021-12-11 DIAGNOSIS — N189 Chronic kidney disease, unspecified: Secondary | ICD-10-CM | POA: Insufficient documentation

## 2021-12-11 DIAGNOSIS — D649 Anemia, unspecified: Secondary | ICD-10-CM | POA: Diagnosis not present

## 2021-12-11 DIAGNOSIS — I890 Lymphedema, not elsewhere classified: Secondary | ICD-10-CM | POA: Diagnosis not present

## 2021-12-11 DIAGNOSIS — Z471 Aftercare following joint replacement surgery: Secondary | ICD-10-CM | POA: Diagnosis not present

## 2021-12-11 DIAGNOSIS — M109 Gout, unspecified: Secondary | ICD-10-CM | POA: Diagnosis not present

## 2021-12-11 DIAGNOSIS — L309 Dermatitis, unspecified: Secondary | ICD-10-CM | POA: Diagnosis not present

## 2021-12-11 LAB — CBC WITH DIFFERENTIAL/PLATELET
Abs Immature Granulocytes: 0.01 10*3/uL (ref 0.00–0.07)
Basophils Absolute: 0 10*3/uL (ref 0.0–0.1)
Basophils Relative: 0 %
Eosinophils Absolute: 0.3 10*3/uL (ref 0.0–0.5)
Eosinophils Relative: 6 %
HCT: 30.8 % — ABNORMAL LOW (ref 36.0–46.0)
Hemoglobin: 9.8 g/dL — ABNORMAL LOW (ref 12.0–15.0)
Immature Granulocytes: 0 %
Lymphocytes Relative: 26 %
Lymphs Abs: 1.3 10*3/uL (ref 0.7–4.0)
MCH: 30.8 pg (ref 26.0–34.0)
MCHC: 31.8 g/dL (ref 30.0–36.0)
MCV: 96.9 fL (ref 80.0–100.0)
Monocytes Absolute: 0.3 10*3/uL (ref 0.1–1.0)
Monocytes Relative: 6 %
Neutro Abs: 3 10*3/uL (ref 1.7–7.7)
Neutrophils Relative %: 62 %
Platelets: 260 10*3/uL (ref 150–400)
RBC: 3.18 MIL/uL — ABNORMAL LOW (ref 3.87–5.11)
RDW: 14.6 % (ref 11.5–15.5)
WBC: 4.9 10*3/uL (ref 4.0–10.5)
nRBC: 0 % (ref 0.0–0.2)

## 2021-12-11 LAB — COMPREHENSIVE METABOLIC PANEL
ALT: 11 U/L (ref 0–44)
AST: 15 U/L (ref 15–41)
Albumin: 3.8 g/dL (ref 3.5–5.0)
Alkaline Phosphatase: 75 U/L (ref 38–126)
Anion gap: 10 (ref 5–15)
BUN: 32 mg/dL — ABNORMAL HIGH (ref 8–23)
CO2: 26 mmol/L (ref 22–32)
Calcium: 9.5 mg/dL (ref 8.9–10.3)
Chloride: 99 mmol/L (ref 98–111)
Creatinine, Ser: 1.12 mg/dL — ABNORMAL HIGH (ref 0.44–1.00)
GFR, Estimated: 54 mL/min — ABNORMAL LOW (ref >60)
Glucose, Bld: 118 mg/dL — ABNORMAL HIGH (ref 70–99)
Potassium: 4 mmol/L (ref 3.5–5.1)
Sodium: 135 mmol/L (ref 135–145)
Total Bilirubin: <0.1 mg/dL — ABNORMAL LOW (ref 0.3–1.2)
Total Protein: 8.2 g/dL — ABNORMAL HIGH (ref 6.5–8.1)

## 2021-12-11 LAB — FERRITIN: Ferritin: 217 ng/mL (ref 11–307)

## 2021-12-11 LAB — IRON AND TIBC
Iron: 64 ug/dL (ref 28–170)
Saturation Ratios: 19 % (ref 10.4–31.8)
TIBC: 332 ug/dL (ref 250–450)
UIBC: 268 ug/dL

## 2021-12-11 NOTE — Addendum Note (Signed)
Addended by: Drue Dun on: 12/11/2021 10:51 AM   Modules accepted: Orders

## 2021-12-11 NOTE — Progress Notes (Signed)
Hematology/Oncology Consult Note [facility] Telephone:([phone] Fax:([phone]  Patient Care Team: Gladstone Lighter, MD as PCP - General (Internal Medicine)   Name of the patient: Autumn Burton  [id number]  08/07/1956   Reason for referral-anemia of chronic kidney disease   Referring physician-[place]  Date of visit: 12/11/21  History of presenting illness-patient is a 66 year old female with a past medical history significant for rheumatoid arthritis, obesity hypertension among other medical problems.  She is a resident at [facility] facility.  She has been referred to [place] for anemia. Patient was recently discharged from orthopedic service after total left knee replacement.  Patient's baseline hemoglobin runs around 10-11 since March 2021.  Prior to that her hemoglobin was closer to 12.  However since her knee replacement surgery her hemoglobin dropped from 10-7.1 gradually and had to be given a unit of blood transfusion.  White count and platelets normal.  MCV 97.7.  Patient denies any blood loss in her stool or.  Denies any dark melanotic stools.  Energy levels have been overall stable.  She is able to walk with the help of a walker around the facility.  She has some component of chronic kidney disease and her creatinine typically fluctuates between 1.1-1 1.4.  Interval History: In the interim, she presented to GYN with postmenopausal bleeding.  Ultrasound revealed submucosal uterine fibroids with endometrium measuring 9 mm.  Endometrial biopsy with Dr. Amalia Hailey with benign pathology.   She underwent right TKA with Dr. Rudene Christians on 10/30/2021. She continues to have pain and limited mobility. Otherwise feels at baseline and no specific complaints.   ECOG PS- 1  Pain scale- 2  Review of systems- Review of Systems  Constitutional:  Positive for malaise/fatigue. Negative for chills, fever and weight loss.  HENT:  Negative for congestion, ear discharge and  nosebleeds.   Eyes:  Negative for blurred vision.  Respiratory:  Negative for cough, hemoptysis, sputum production, shortness of breath and wheezing.   Cardiovascular:  Negative for chest pain, palpitations, orthopnea and claudication.  Gastrointestinal:  Negative for abdominal pain, blood in stool, constipation, diarrhea, heartburn, melena, nausea and vomiting.  Genitourinary:  Negative for dysuria, flank pain, frequency, hematuria and urgency.  Musculoskeletal:  Negative for back pain, joint pain and myalgias.  Skin:  Negative for rash.  Neurological:  Negative for dizziness, tingling, focal weakness, seizures, weakness and headaches.  Endo/Heme/Allergies:  Does not bruise/bleed easily.  Psychiatric/Behavioral:  Negative for depression and suicidal ideas. The patient does not have insomnia.    Allergies  Allergen Reactions   Latex Hives, Itching and Rash    Redness    Patient Active Problem List   Diagnosis Date Noted   S/P TKR (total knee replacement) using cement, right 10/30/2021   S/P TKR (total knee replacement) using cement, left 06/05/2021   Ulcer of right leg, limited to breakdown of skin ([place]) 05/11/2021   Chronic pain of both knees 11/15/2020   History of abnormal mammogram 04/06/2020   Retaining fluid 04/06/2020   Decreased hemoglobin 04/06/2020   AKI (acute kidney injury) ([place]) 04/06/2020   History of sepsis 03/01/2020   Hypokalemia 03/01/2020   History of rhabdomyolysis 03/01/2020   Rheumatoid arthritis ([place]) 03/01/2020   Peripheral neuropathy 03/01/2020   Hx of colonic polyps    Polyp of sigmoid colon    Morbidly obese ([place]) 03/06/2016   Chronic eczema 01/17/2015   Cough due to ACE inhibitor 01/17/2015   Essential (primary) hypertension 01/17/2015   Secondary osteoarthritis of  right ankle 01/17/2015   Acquired lymphedema of leg 11/25/2014   Infection and inflammatory reaction due to internal orthopedic device, implant, and graft ([place]) 05/01/2010    Pseudoarthrosis of cervical spine ([place]) 01/12/2010   Hyperthyroidism, subclinical 01/12/2010   Tachycardia 01/12/2010   Nonunion of fracture 01/12/2010    Past Medical History:  Diagnosis Date   Anemia    Arthritis    GERD (gastroesophageal reflux disease)    Hypertension    Lymphedema of both lower extremities    being followed by Dr Rhoderick Moody Boots bil   Venous ulcer of right leg ([place])    thigh-being followed by dr dew    Past Surgical History:  Procedure Laterality Date   COLONOSCOPY WITH PROPOFOL N/A 10/28/2016   Procedure: COLONOSCOPY WITH PROPOFOL;  Surgeon: Jonathon Bellows, MD;  Location: [facility] ENDOSCOPY;  Service: Endoscopy;  Laterality: N/A;   DG RIGHT TIBIA AND FIBULA ([place] HX)     LEG SURGERY Right    TOTAL KNEE ARTHROPLASTY Left 06/05/2021   Procedure: TOTAL KNEE ARTHROPLASTY;  Surgeon: Hessie Knows, MD;  Location: [facility] ORS;  Service: Orthopedics;  Laterality: Left;   TOTAL KNEE ARTHROPLASTY Right 10/30/2021   Procedure: TOTAL KNEE ARTHROPLASTY;  Surgeon: Hessie Knows, MD;  Location: [facility] ORS;  Service: Orthopedics;  Laterality: Right;    Social History   Socioeconomic History   Marital status: Widowed    Spouse name: Not on file   Number of children: 1   Years of education: Not on file   Highest education level: Not on file  Occupational History   Not on file  Tobacco Use   Smoking status: Never   Smokeless tobacco: Never  Vaping Use   Vaping Use: Never used  Substance and Sexual Activity   Alcohol use: No    Alcohol/week: 0.0 standard drinks   Drug use: No   Sexual activity: Not on file  Other Topics Concern   Not on file  Social History Narrative   Live alone.  Sisters, son live close by.   Social Determinants of Health   Financial Resource Strain: Not on file  Food Insecurity: Not on file  Transportation Needs: Not on file  Physical Activity: Not on file  Stress: Not on file  Social Connections: Not on file  Intimate Partner Violence: Not on  file     Family History  Problem Relation Age of Onset   Diabetes Mother    COPD Mother    Heart disease Mother    Healthy Sister    Healthy Son     Current Outpatient Medications:    calcium carbonate (TUMS - DOSED IN MG ELEMENTAL CALCIUM) 500 MG chewable tablet, Chew 1 tablet by mouth daily as needed for indigestion or heartburn., Disp: , Rfl:    cholecalciferol (VITAMIN D3) 25 MCG (1000 UNIT) tablet, Take 1,000 Units by mouth daily., Disp: , Rfl:    ferrous sulfate 325 (65 FE) MG tablet, Take 325 mg by mouth daily with breakfast., Disp: , Rfl:    folic acid (FOLVITE) 1 MG tablet, Take 1 mg by mouth See admin instructions. Take 1 mg by mouth daily except for Sunday, Disp: , Rfl:    furosemide (LASIX) 20 MG tablet, Take 1 tablet (20 mg total) by mouth daily., Disp: 90 tablet, Rfl: 1   gabapentin (NEURONTIN) 300 MG capsule, Take 300 mg by mouth 3 (three) times daily., Disp: , Rfl:    hydrochlorothiazide (HYDRODIURIL) 25 MG tablet, Take 1 tablet (25 mg total) by  mouth daily., Disp: 90 tablet, Rfl: 1   methocarbamol (ROBAXIN) 500 MG tablet, Take 1 tablet (500 mg total) by mouth every 6 (six) hours as needed for muscle spasms., Disp: , Rfl:    methotrexate 2.5 MG tablet, Take 15 mg by mouth every Sunday., Disp: , Rfl:    metoprolol tartrate (LOPRESSOR) 25 MG tablet, Take 1 tablet (25 mg total) by mouth daily. (Patient taking differently: Take 25 mg by mouth every morning.), Disp: 90 tablet, Rfl: 1   olmesartan (BENICAR) 20 MG tablet, Take 1 tablet (20 mg total) by mouth daily. (Patient taking differently: Take 20 mg by mouth every morning.), Disp: 90 tablet, Rfl: 1   potassium chloride (KLOR-CON) 10 MEQ tablet, TAKE 1 TABLET BY MOUTH ONCE DAILY. TAKE WITH FUROSEMIDE. (Patient taking differently: Take 10 mEq by mouth daily.), Disp: 90 tablet, Rfl: 1   traMADol (ULTRAM) 50 MG tablet, Take 1 tablet (50 mg total) by mouth every 6 (six) hours as needed., Disp: 30 tablet, Rfl: 0   triamcinolone  ointment (KENALOG) 0.1 %, Apply 1 application topically daily as needed (eczema flares)., Disp: , Rfl:    vitamin B-12 (CYANOCOBALAMIN) 1000 MCG tablet, Take 1,000 mcg by mouth daily., Disp: , Rfl:    docusate sodium (COLACE) 100 MG capsule, Take 1 capsule (100 mg total) by mouth 2 (two) times daily. (Patient not taking: Reported on 10/15/2021), Disp: 10 capsule, Rfl: 0   halobetasol (ULTRAVATE) 0.05 % ointment, Apply 1 application topically 2 (two) times daily as needed (irritation). (Patient not taking: Reported on 12/11/2021), Disp: , Rfl:    HYDROcodone-acetaminophen (NORCO) 7.5-325 MG tablet, Take 1-2 tablets by mouth every 4 (four) hours as needed for severe pain (pain score 7-10). (Patient not taking: Reported on 12/11/2021), Disp: 30 tablet, Rfl: 0   polyethylene glycol (MIRALAX / GLYCOLAX) 17 g packet, Take 17 g by mouth daily as needed for mild constipation. (Patient not taking: Reported on 12/11/2021), Disp: 14 each, Rfl: 0   Physical exam:  Vitals:   12/11/21 0951 12/11/21 0956  BP:  (!) 148/66  Pulse:  91  Resp:  18  Temp:  (!) 96.3 F (35.7 C)  TempSrc:  Tympanic  SpO2:  100%  Weight: 257 lb (116.6 kg)    Physical Exam Constitutional:      General: She is not in acute distress. Pulmonary:     Effort: Pulmonary effort is normal. No respiratory distress.  Abdominal:     Tenderness: There is no abdominal tenderness.  Musculoskeletal:     Right lower leg: Edema present.     Left lower leg: Edema present.     Comments: Wheelchair.   Skin:    General: Skin is warm and dry.  Neurological:     Mental Status: She is alert and oriented to person, place, and time.  Psychiatric:        Mood and Affect: Mood normal.        Behavior: Behavior normal.    CMP Latest Ref Rng & Units 12/11/2021 10/31/2021 10/30/2021  Glucose 70 - 99 mg/dL 118(H) 175(H) -  BUN 8 - 23 mg/dL 32(H) 25(H) -  Creatinine 0.44 - 1.00 mg/dL 1.12(H) 1.16(H) 1.21(H)  Sodium 135 - 145 mmol/L 135 133(L) -   Potassium 3.5 - 5.1 mmol/L 4.0 4.5 -  Chloride 98 - 111 mmol/L 99 104 -  CO2 22 - 32 mmol/L 26 24 -  Calcium 8.9 - 10.3 mg/dL 9.5 8.6(L) -  Total Protein 6.5 - 8.1 g/dL  8.2(H) - -  Total Bilirubin 0.3 - 1.2 mg/dL <0.1(L) - -  Alkaline Phos 38 - 126 U/L 75 - -  AST 15 - 41 U/L 15 - -  ALT 0 - 44 U/L 11 - -   CBC Latest Ref Rng & Units 12/11/2021 11/01/2021 10/31/2021  WBC 4.0 - 10.5 K/uL 4.9 10.5 8.6  Hemoglobin 12.0 - 15.0 g/dL 9.8(L) 7.9(L) 8.5(L)  Hematocrit 36.0 - 46.0 % 30.8(L) 23.5(L) 26.7(L)  Platelets 150 - 400 K/uL 260 240 253   Iron/TIBC/Ferritin/ %Sat    Component Value Date/Time   IRON 78 08/28/2021 0911   TIBC 382 08/28/2021 0911   FERRITIN 195 08/28/2021 0911   IRONPCTSAT 20 08/28/2021 0911    No images are attached to the encounter.  No results found.  Assessment and plan- Patient is a 66 y.o. female   1.  Anemia of chronic kidney disease-hemoglobin has ranged between 10 and 11 since March 2021.  Prior to that, her hemoglobin was around 12.  Ferritin was elevated with iron saturation of 19% and normal TIBC.  No significant iron deficiency.  Ferritin elevated likely as an acute phase reactant.  Haptoglobin, TSH, myeloma panel, folate were normal.  Associated B12 deficiency.  She has not yet had to start Retacrit. Hemoglobin today 9.8. She's recently had knee surgery and I question how much of her anemia is secondary to blood loss. Her kidney function is stable. I'm reluctant to start retacrit at this point. B12 and iron studies pending at time of visit. If iron studies continue to be normal, anemia may be more related to anemia of CKD as opposed to blood loss. Hold retacrit at this time.   2.  B12 deficiency-B12 was mildly low at 239.  She was started on oral B12 1000 mcg daily.  3. CKD- stage 3A CKD with gfr > 50. Follow up with pcp. Avoid nephrotoxic substances  3 mo- lab (cbc, cmp, ferritin, iron studies, b12) Day to week later see Janese Banks (virtual or in person)-  la  Thank you for this kind referral and the opportunity to participate in the care of this patient  Visit Diagnosis 1. Anemia of chronic kidney failure, stage 3 (moderate) ([place])   2. B12 deficiency    Beckey Rutter, DNP, AGNP-C [place] at [facility] [phone] (clinic) 12/11/2021

## 2021-12-11 NOTE — Telephone Encounter (Signed)
Labs entered and pt had labs drawn and I will fax the results once finished to dermatology

## 2021-12-11 NOTE — Progress Notes (Signed)
Patient has knee pain and leg swelling, she had knee surgery 11/01/21. She reports lack of appetite.

## 2021-12-12 DIAGNOSIS — Z96651 Presence of right artificial knee joint: Secondary | ICD-10-CM | POA: Diagnosis not present

## 2021-12-12 DIAGNOSIS — M8588 Other specified disorders of bone density and structure, other site: Secondary | ICD-10-CM | POA: Diagnosis not present

## 2021-12-14 DIAGNOSIS — K219 Gastro-esophageal reflux disease without esophagitis: Secondary | ICD-10-CM | POA: Diagnosis not present

## 2021-12-14 DIAGNOSIS — I10 Essential (primary) hypertension: Secondary | ICD-10-CM | POA: Diagnosis not present

## 2021-12-14 DIAGNOSIS — Z96651 Presence of right artificial knee joint: Secondary | ICD-10-CM | POA: Diagnosis not present

## 2021-12-14 DIAGNOSIS — M069 Rheumatoid arthritis, unspecified: Secondary | ICD-10-CM | POA: Diagnosis not present

## 2021-12-14 DIAGNOSIS — Z9181 History of falling: Secondary | ICD-10-CM | POA: Diagnosis not present

## 2021-12-14 DIAGNOSIS — Z96652 Presence of left artificial knee joint: Secondary | ICD-10-CM | POA: Diagnosis not present

## 2021-12-14 DIAGNOSIS — Z86010 Personal history of colonic polyps: Secondary | ICD-10-CM | POA: Diagnosis not present

## 2021-12-14 DIAGNOSIS — G43909 Migraine, unspecified, not intractable, without status migrainosus: Secondary | ICD-10-CM | POA: Diagnosis not present

## 2021-12-14 DIAGNOSIS — D649 Anemia, unspecified: Secondary | ICD-10-CM | POA: Diagnosis not present

## 2021-12-14 DIAGNOSIS — E213 Hyperparathyroidism, unspecified: Secondary | ICD-10-CM | POA: Diagnosis not present

## 2021-12-14 DIAGNOSIS — Z6841 Body Mass Index (BMI) 40.0 and over, adult: Secondary | ICD-10-CM | POA: Diagnosis not present

## 2021-12-14 DIAGNOSIS — G8929 Other chronic pain: Secondary | ICD-10-CM | POA: Diagnosis not present

## 2021-12-14 DIAGNOSIS — M109 Gout, unspecified: Secondary | ICD-10-CM | POA: Diagnosis not present

## 2021-12-14 DIAGNOSIS — E1142 Type 2 diabetes mellitus with diabetic polyneuropathy: Secondary | ICD-10-CM | POA: Diagnosis not present

## 2021-12-14 DIAGNOSIS — I890 Lymphedema, not elsewhere classified: Secondary | ICD-10-CM | POA: Diagnosis not present

## 2021-12-14 DIAGNOSIS — L309 Dermatitis, unspecified: Secondary | ICD-10-CM | POA: Diagnosis not present

## 2021-12-14 DIAGNOSIS — Z471 Aftercare following joint replacement surgery: Secondary | ICD-10-CM | POA: Diagnosis not present

## 2021-12-18 DIAGNOSIS — L309 Dermatitis, unspecified: Secondary | ICD-10-CM | POA: Diagnosis not present

## 2021-12-18 DIAGNOSIS — G8929 Other chronic pain: Secondary | ICD-10-CM | POA: Diagnosis not present

## 2021-12-18 DIAGNOSIS — Z6841 Body Mass Index (BMI) 40.0 and over, adult: Secondary | ICD-10-CM | POA: Diagnosis not present

## 2021-12-18 DIAGNOSIS — G43909 Migraine, unspecified, not intractable, without status migrainosus: Secondary | ICD-10-CM | POA: Diagnosis not present

## 2021-12-18 DIAGNOSIS — M109 Gout, unspecified: Secondary | ICD-10-CM | POA: Diagnosis not present

## 2021-12-18 DIAGNOSIS — Z96652 Presence of left artificial knee joint: Secondary | ICD-10-CM | POA: Diagnosis not present

## 2021-12-18 DIAGNOSIS — I890 Lymphedema, not elsewhere classified: Secondary | ICD-10-CM | POA: Diagnosis not present

## 2021-12-18 DIAGNOSIS — Z96651 Presence of right artificial knee joint: Secondary | ICD-10-CM | POA: Diagnosis not present

## 2021-12-18 DIAGNOSIS — Z86010 Personal history of colonic polyps: Secondary | ICD-10-CM | POA: Diagnosis not present

## 2021-12-18 DIAGNOSIS — E1142 Type 2 diabetes mellitus with diabetic polyneuropathy: Secondary | ICD-10-CM | POA: Diagnosis not present

## 2021-12-18 DIAGNOSIS — M069 Rheumatoid arthritis, unspecified: Secondary | ICD-10-CM | POA: Diagnosis not present

## 2021-12-18 DIAGNOSIS — Z471 Aftercare following joint replacement surgery: Secondary | ICD-10-CM | POA: Diagnosis not present

## 2021-12-18 DIAGNOSIS — Z9181 History of falling: Secondary | ICD-10-CM | POA: Diagnosis not present

## 2021-12-18 DIAGNOSIS — I10 Essential (primary) hypertension: Secondary | ICD-10-CM | POA: Diagnosis not present

## 2021-12-18 DIAGNOSIS — K219 Gastro-esophageal reflux disease without esophagitis: Secondary | ICD-10-CM | POA: Diagnosis not present

## 2021-12-18 DIAGNOSIS — D649 Anemia, unspecified: Secondary | ICD-10-CM | POA: Diagnosis not present

## 2021-12-18 DIAGNOSIS — E213 Hyperparathyroidism, unspecified: Secondary | ICD-10-CM | POA: Diagnosis not present

## 2021-12-21 DIAGNOSIS — M069 Rheumatoid arthritis, unspecified: Secondary | ICD-10-CM | POA: Diagnosis not present

## 2021-12-21 DIAGNOSIS — L309 Dermatitis, unspecified: Secondary | ICD-10-CM | POA: Diagnosis not present

## 2021-12-21 DIAGNOSIS — Z9181 History of falling: Secondary | ICD-10-CM | POA: Diagnosis not present

## 2021-12-21 DIAGNOSIS — Z471 Aftercare following joint replacement surgery: Secondary | ICD-10-CM | POA: Diagnosis not present

## 2021-12-21 DIAGNOSIS — M109 Gout, unspecified: Secondary | ICD-10-CM | POA: Diagnosis not present

## 2021-12-21 DIAGNOSIS — Z96651 Presence of right artificial knee joint: Secondary | ICD-10-CM | POA: Diagnosis not present

## 2021-12-21 DIAGNOSIS — Z6841 Body Mass Index (BMI) 40.0 and over, adult: Secondary | ICD-10-CM | POA: Diagnosis not present

## 2021-12-21 DIAGNOSIS — I890 Lymphedema, not elsewhere classified: Secondary | ICD-10-CM | POA: Diagnosis not present

## 2021-12-21 DIAGNOSIS — Z86010 Personal history of colonic polyps: Secondary | ICD-10-CM | POA: Diagnosis not present

## 2021-12-21 DIAGNOSIS — I10 Essential (primary) hypertension: Secondary | ICD-10-CM | POA: Diagnosis not present

## 2021-12-21 DIAGNOSIS — G43909 Migraine, unspecified, not intractable, without status migrainosus: Secondary | ICD-10-CM | POA: Diagnosis not present

## 2021-12-21 DIAGNOSIS — E213 Hyperparathyroidism, unspecified: Secondary | ICD-10-CM | POA: Diagnosis not present

## 2021-12-21 DIAGNOSIS — Z96652 Presence of left artificial knee joint: Secondary | ICD-10-CM | POA: Diagnosis not present

## 2021-12-21 DIAGNOSIS — D649 Anemia, unspecified: Secondary | ICD-10-CM | POA: Diagnosis not present

## 2021-12-21 DIAGNOSIS — E1142 Type 2 diabetes mellitus with diabetic polyneuropathy: Secondary | ICD-10-CM | POA: Diagnosis not present

## 2021-12-21 DIAGNOSIS — G8929 Other chronic pain: Secondary | ICD-10-CM | POA: Diagnosis not present

## 2021-12-21 DIAGNOSIS — K219 Gastro-esophageal reflux disease without esophagitis: Secondary | ICD-10-CM | POA: Diagnosis not present

## 2021-12-25 DIAGNOSIS — I890 Lymphedema, not elsewhere classified: Secondary | ICD-10-CM | POA: Diagnosis not present

## 2021-12-25 DIAGNOSIS — Z96651 Presence of right artificial knee joint: Secondary | ICD-10-CM | POA: Diagnosis not present

## 2021-12-25 DIAGNOSIS — Z471 Aftercare following joint replacement surgery: Secondary | ICD-10-CM | POA: Diagnosis not present

## 2021-12-25 DIAGNOSIS — Z96652 Presence of left artificial knee joint: Secondary | ICD-10-CM | POA: Diagnosis not present

## 2021-12-25 DIAGNOSIS — Z9181 History of falling: Secondary | ICD-10-CM | POA: Diagnosis not present

## 2021-12-25 DIAGNOSIS — K219 Gastro-esophageal reflux disease without esophagitis: Secondary | ICD-10-CM | POA: Diagnosis not present

## 2021-12-25 DIAGNOSIS — Z86010 Personal history of colonic polyps: Secondary | ICD-10-CM | POA: Diagnosis not present

## 2021-12-25 DIAGNOSIS — G43909 Migraine, unspecified, not intractable, without status migrainosus: Secondary | ICD-10-CM | POA: Diagnosis not present

## 2021-12-25 DIAGNOSIS — D649 Anemia, unspecified: Secondary | ICD-10-CM | POA: Diagnosis not present

## 2021-12-25 DIAGNOSIS — Z6841 Body Mass Index (BMI) 40.0 and over, adult: Secondary | ICD-10-CM | POA: Diagnosis not present

## 2021-12-25 DIAGNOSIS — E213 Hyperparathyroidism, unspecified: Secondary | ICD-10-CM | POA: Diagnosis not present

## 2021-12-25 DIAGNOSIS — L309 Dermatitis, unspecified: Secondary | ICD-10-CM | POA: Diagnosis not present

## 2021-12-25 DIAGNOSIS — G8929 Other chronic pain: Secondary | ICD-10-CM | POA: Diagnosis not present

## 2021-12-25 DIAGNOSIS — E1142 Type 2 diabetes mellitus with diabetic polyneuropathy: Secondary | ICD-10-CM | POA: Diagnosis not present

## 2021-12-25 DIAGNOSIS — M109 Gout, unspecified: Secondary | ICD-10-CM | POA: Diagnosis not present

## 2021-12-25 DIAGNOSIS — I10 Essential (primary) hypertension: Secondary | ICD-10-CM | POA: Diagnosis not present

## 2021-12-25 DIAGNOSIS — M069 Rheumatoid arthritis, unspecified: Secondary | ICD-10-CM | POA: Diagnosis not present

## 2022-01-01 DIAGNOSIS — R21 Rash and other nonspecific skin eruption: Secondary | ICD-10-CM | POA: Diagnosis not present

## 2022-01-01 DIAGNOSIS — L309 Dermatitis, unspecified: Secondary | ICD-10-CM | POA: Diagnosis not present

## 2022-01-01 DIAGNOSIS — L2084 Intrinsic (allergic) eczema: Secondary | ICD-10-CM | POA: Diagnosis not present

## 2022-01-08 DIAGNOSIS — Z780 Asymptomatic menopausal state: Secondary | ICD-10-CM | POA: Diagnosis not present

## 2022-01-08 DIAGNOSIS — E119 Type 2 diabetes mellitus without complications: Secondary | ICD-10-CM | POA: Diagnosis not present

## 2022-01-09 DIAGNOSIS — E213 Hyperparathyroidism, unspecified: Secondary | ICD-10-CM | POA: Diagnosis not present

## 2022-01-09 DIAGNOSIS — Z86010 Personal history of colonic polyps: Secondary | ICD-10-CM | POA: Diagnosis not present

## 2022-01-09 DIAGNOSIS — Z471 Aftercare following joint replacement surgery: Secondary | ICD-10-CM | POA: Diagnosis not present

## 2022-01-09 DIAGNOSIS — Z9181 History of falling: Secondary | ICD-10-CM | POA: Diagnosis not present

## 2022-01-09 DIAGNOSIS — M069 Rheumatoid arthritis, unspecified: Secondary | ICD-10-CM | POA: Diagnosis not present

## 2022-01-09 DIAGNOSIS — Z6841 Body Mass Index (BMI) 40.0 and over, adult: Secondary | ICD-10-CM | POA: Diagnosis not present

## 2022-01-09 DIAGNOSIS — E1142 Type 2 diabetes mellitus with diabetic polyneuropathy: Secondary | ICD-10-CM | POA: Diagnosis not present

## 2022-01-09 DIAGNOSIS — D649 Anemia, unspecified: Secondary | ICD-10-CM | POA: Diagnosis not present

## 2022-01-09 DIAGNOSIS — G8929 Other chronic pain: Secondary | ICD-10-CM | POA: Diagnosis not present

## 2022-01-09 DIAGNOSIS — Z96651 Presence of right artificial knee joint: Secondary | ICD-10-CM | POA: Diagnosis not present

## 2022-01-09 DIAGNOSIS — G43909 Migraine, unspecified, not intractable, without status migrainosus: Secondary | ICD-10-CM | POA: Diagnosis not present

## 2022-01-09 DIAGNOSIS — K219 Gastro-esophageal reflux disease without esophagitis: Secondary | ICD-10-CM | POA: Diagnosis not present

## 2022-01-09 DIAGNOSIS — I890 Lymphedema, not elsewhere classified: Secondary | ICD-10-CM | POA: Diagnosis not present

## 2022-01-09 DIAGNOSIS — M109 Gout, unspecified: Secondary | ICD-10-CM | POA: Diagnosis not present

## 2022-01-09 DIAGNOSIS — L309 Dermatitis, unspecified: Secondary | ICD-10-CM | POA: Diagnosis not present

## 2022-01-09 DIAGNOSIS — Z96652 Presence of left artificial knee joint: Secondary | ICD-10-CM | POA: Diagnosis not present

## 2022-01-09 DIAGNOSIS — I10 Essential (primary) hypertension: Secondary | ICD-10-CM | POA: Diagnosis not present

## 2022-01-11 DIAGNOSIS — M069 Rheumatoid arthritis, unspecified: Secondary | ICD-10-CM | POA: Diagnosis not present

## 2022-01-11 DIAGNOSIS — E1142 Type 2 diabetes mellitus with diabetic polyneuropathy: Secondary | ICD-10-CM | POA: Diagnosis not present

## 2022-01-11 DIAGNOSIS — K219 Gastro-esophageal reflux disease without esophagitis: Secondary | ICD-10-CM | POA: Diagnosis not present

## 2022-01-11 DIAGNOSIS — I10 Essential (primary) hypertension: Secondary | ICD-10-CM | POA: Diagnosis not present

## 2022-01-11 DIAGNOSIS — Z96652 Presence of left artificial knee joint: Secondary | ICD-10-CM | POA: Diagnosis not present

## 2022-01-11 DIAGNOSIS — L309 Dermatitis, unspecified: Secondary | ICD-10-CM | POA: Diagnosis not present

## 2022-01-11 DIAGNOSIS — M109 Gout, unspecified: Secondary | ICD-10-CM | POA: Diagnosis not present

## 2022-01-11 DIAGNOSIS — G8929 Other chronic pain: Secondary | ICD-10-CM | POA: Diagnosis not present

## 2022-01-11 DIAGNOSIS — Z471 Aftercare following joint replacement surgery: Secondary | ICD-10-CM | POA: Diagnosis not present

## 2022-01-11 DIAGNOSIS — Z9181 History of falling: Secondary | ICD-10-CM | POA: Diagnosis not present

## 2022-01-11 DIAGNOSIS — Z96651 Presence of right artificial knee joint: Secondary | ICD-10-CM | POA: Diagnosis not present

## 2022-01-11 DIAGNOSIS — E213 Hyperparathyroidism, unspecified: Secondary | ICD-10-CM | POA: Diagnosis not present

## 2022-01-11 DIAGNOSIS — Z6841 Body Mass Index (BMI) 40.0 and over, adult: Secondary | ICD-10-CM | POA: Diagnosis not present

## 2022-01-11 DIAGNOSIS — Z86010 Personal history of colonic polyps: Secondary | ICD-10-CM | POA: Diagnosis not present

## 2022-01-11 DIAGNOSIS — D649 Anemia, unspecified: Secondary | ICD-10-CM | POA: Diagnosis not present

## 2022-01-11 DIAGNOSIS — G43909 Migraine, unspecified, not intractable, without status migrainosus: Secondary | ICD-10-CM | POA: Diagnosis not present

## 2022-01-11 DIAGNOSIS — I890 Lymphedema, not elsewhere classified: Secondary | ICD-10-CM | POA: Diagnosis not present

## 2022-01-14 DIAGNOSIS — L309 Dermatitis, unspecified: Secondary | ICD-10-CM | POA: Diagnosis not present

## 2022-01-14 DIAGNOSIS — E213 Hyperparathyroidism, unspecified: Secondary | ICD-10-CM | POA: Diagnosis not present

## 2022-01-14 DIAGNOSIS — M109 Gout, unspecified: Secondary | ICD-10-CM | POA: Diagnosis not present

## 2022-01-14 DIAGNOSIS — Z6841 Body Mass Index (BMI) 40.0 and over, adult: Secondary | ICD-10-CM | POA: Diagnosis not present

## 2022-01-14 DIAGNOSIS — I890 Lymphedema, not elsewhere classified: Secondary | ICD-10-CM | POA: Diagnosis not present

## 2022-01-14 DIAGNOSIS — Z9181 History of falling: Secondary | ICD-10-CM | POA: Diagnosis not present

## 2022-01-14 DIAGNOSIS — I10 Essential (primary) hypertension: Secondary | ICD-10-CM | POA: Diagnosis not present

## 2022-01-14 DIAGNOSIS — G43909 Migraine, unspecified, not intractable, without status migrainosus: Secondary | ICD-10-CM | POA: Diagnosis not present

## 2022-01-14 DIAGNOSIS — Z96651 Presence of right artificial knee joint: Secondary | ICD-10-CM | POA: Diagnosis not present

## 2022-01-14 DIAGNOSIS — G8929 Other chronic pain: Secondary | ICD-10-CM | POA: Diagnosis not present

## 2022-01-14 DIAGNOSIS — Z471 Aftercare following joint replacement surgery: Secondary | ICD-10-CM | POA: Diagnosis not present

## 2022-01-14 DIAGNOSIS — K219 Gastro-esophageal reflux disease without esophagitis: Secondary | ICD-10-CM | POA: Diagnosis not present

## 2022-01-14 DIAGNOSIS — Z96652 Presence of left artificial knee joint: Secondary | ICD-10-CM | POA: Diagnosis not present

## 2022-01-14 DIAGNOSIS — M069 Rheumatoid arthritis, unspecified: Secondary | ICD-10-CM | POA: Diagnosis not present

## 2022-01-14 DIAGNOSIS — E1142 Type 2 diabetes mellitus with diabetic polyneuropathy: Secondary | ICD-10-CM | POA: Diagnosis not present

## 2022-01-14 DIAGNOSIS — D649 Anemia, unspecified: Secondary | ICD-10-CM | POA: Diagnosis not present

## 2022-01-14 DIAGNOSIS — Z86010 Personal history of colonic polyps: Secondary | ICD-10-CM | POA: Diagnosis not present

## 2022-01-15 DIAGNOSIS — I890 Lymphedema, not elsewhere classified: Secondary | ICD-10-CM | POA: Diagnosis not present

## 2022-01-15 DIAGNOSIS — M069 Rheumatoid arthritis, unspecified: Secondary | ICD-10-CM | POA: Diagnosis not present

## 2022-01-15 DIAGNOSIS — M1712 Unilateral primary osteoarthritis, left knee: Secondary | ICD-10-CM | POA: Diagnosis not present

## 2022-01-15 DIAGNOSIS — Z1211 Encounter for screening for malignant neoplasm of colon: Secondary | ICD-10-CM | POA: Diagnosis not present

## 2022-01-15 DIAGNOSIS — Z853 Personal history of malignant neoplasm of breast: Secondary | ICD-10-CM | POA: Diagnosis not present

## 2022-01-15 DIAGNOSIS — E119 Type 2 diabetes mellitus without complications: Secondary | ICD-10-CM | POA: Diagnosis not present

## 2022-01-16 ENCOUNTER — Other Ambulatory Visit: Payer: Self-pay | Admitting: Internal Medicine

## 2022-01-16 ENCOUNTER — Other Ambulatory Visit: Payer: Self-pay

## 2022-01-16 DIAGNOSIS — L309 Dermatitis, unspecified: Secondary | ICD-10-CM | POA: Diagnosis not present

## 2022-01-16 DIAGNOSIS — K219 Gastro-esophageal reflux disease without esophagitis: Secondary | ICD-10-CM | POA: Diagnosis not present

## 2022-01-16 DIAGNOSIS — Z9181 History of falling: Secondary | ICD-10-CM | POA: Diagnosis not present

## 2022-01-16 DIAGNOSIS — Z1231 Encounter for screening mammogram for malignant neoplasm of breast: Secondary | ICD-10-CM

## 2022-01-16 DIAGNOSIS — G43909 Migraine, unspecified, not intractable, without status migrainosus: Secondary | ICD-10-CM | POA: Diagnosis not present

## 2022-01-16 DIAGNOSIS — Z96651 Presence of right artificial knee joint: Secondary | ICD-10-CM | POA: Diagnosis not present

## 2022-01-16 DIAGNOSIS — G8929 Other chronic pain: Secondary | ICD-10-CM | POA: Diagnosis not present

## 2022-01-16 DIAGNOSIS — E213 Hyperparathyroidism, unspecified: Secondary | ICD-10-CM | POA: Diagnosis not present

## 2022-01-16 DIAGNOSIS — Z96652 Presence of left artificial knee joint: Secondary | ICD-10-CM | POA: Diagnosis not present

## 2022-01-16 DIAGNOSIS — M109 Gout, unspecified: Secondary | ICD-10-CM | POA: Diagnosis not present

## 2022-01-16 DIAGNOSIS — I10 Essential (primary) hypertension: Secondary | ICD-10-CM | POA: Diagnosis not present

## 2022-01-16 DIAGNOSIS — M069 Rheumatoid arthritis, unspecified: Secondary | ICD-10-CM | POA: Diagnosis not present

## 2022-01-16 DIAGNOSIS — I890 Lymphedema, not elsewhere classified: Secondary | ICD-10-CM | POA: Diagnosis not present

## 2022-01-16 DIAGNOSIS — Z6841 Body Mass Index (BMI) 40.0 and over, adult: Secondary | ICD-10-CM | POA: Diagnosis not present

## 2022-01-16 DIAGNOSIS — Z86010 Personal history of colonic polyps: Secondary | ICD-10-CM

## 2022-01-16 DIAGNOSIS — E1142 Type 2 diabetes mellitus with diabetic polyneuropathy: Secondary | ICD-10-CM | POA: Diagnosis not present

## 2022-01-16 DIAGNOSIS — Z471 Aftercare following joint replacement surgery: Secondary | ICD-10-CM | POA: Diagnosis not present

## 2022-01-16 DIAGNOSIS — D649 Anemia, unspecified: Secondary | ICD-10-CM | POA: Diagnosis not present

## 2022-01-16 MED ORDER — CLENPIQ 10-3.5-12 MG-GM -GM/160ML PO SOLN: 1.0000 | Freq: Once | ORAL | 0 refills | Status: AC

## 2022-01-16 NOTE — Progress Notes (Signed)
Gastroenterology Pre-Procedure Review ? ?Request Date: 03/18/2022 ?Requesting Physician: Dr. Vicente Males ? ?PATIENT REVIEW QUESTIONS: The patient responded to the following health history questions as indicated:   ? ?1. Are you having any GI issues? no ?2. Do you have a personal history of Polyps? yes (10/2016 polyps removed patient reports.) ?3. Do you have a family history of Colon Cancer or Polyps? no ?4. Diabetes Mellitus? no ?5. Joint replacements in the past 12 months?yes (Right Knee 10/2021.) ?6. Major health problems in the past 3 months?no ?7. Any artificial heart valves, MVP, or defibrillator?no ?   ?MEDICATIONS & ALLERGIES:    ?Patient reports the following regarding taking any anticoagulation/antiplatelet therapy:   ?Plavix, Coumadin, Eliquis, Xarelto, Lovenox, Pradaxa, Brilinta, or Effient? no ?Aspirin? no ? ?Patient confirms/reports the following medications:  ?Current Outpatient Medications  ?Medication Sig Dispense Refill  ? calcium carbonate (TUMS - DOSED IN MG ELEMENTAL CALCIUM) 500 MG chewable tablet Chew 1 tablet by mouth daily as needed for indigestion or heartburn.    ? cholecalciferol (VITAMIN D3) 25 MCG (1000 UNIT) tablet Take 1,000 Units by mouth daily.    ? docusate sodium (COLACE) 100 MG capsule Take 1 capsule (100 mg total) by mouth 2 (two) times daily. (Patient not taking: Reported on 10/15/2021) 10 capsule 0  ? ferrous sulfate 325 (65 FE) MG tablet Take 325 mg by mouth daily with breakfast.    ? folic acid (FOLVITE) 1 MG tablet Take 1 mg by mouth See admin instructions. Take 1 mg by mouth daily except for Sunday    ? furosemide (LASIX) 20 MG tablet Take 1 tablet (20 mg total) by mouth daily. 90 tablet 1  ? gabapentin (NEURONTIN) 300 MG capsule Take 300 mg by mouth 3 (three) times daily.    ? halobetasol (ULTRAVATE) 0.05 % ointment Apply 1 application topically 2 (two) times daily as needed (irritation). (Patient not taking: Reported on 12/11/2021)    ? hydrochlorothiazide (HYDRODIURIL) 25 MG  tablet Take 1 tablet (25 mg total) by mouth daily. 90 tablet 1  ? HYDROcodone-acetaminophen (NORCO) 7.5-325 MG tablet Take 1-2 tablets by mouth every 4 (four) hours as needed for severe pain (pain score 7-10). (Patient not taking: Reported on 12/11/2021) 30 tablet 0  ? methocarbamol (ROBAXIN) 500 MG tablet Take 1 tablet (500 mg total) by mouth every 6 (six) hours as needed for muscle spasms.    ? methotrexate 2.5 MG tablet Take 15 mg by mouth every Sunday.    ? metoprolol tartrate (LOPRESSOR) 25 MG tablet Take 1 tablet (25 mg total) by mouth daily. (Patient taking differently: Take 25 mg by mouth every morning.) 90 tablet 1  ? olmesartan (BENICAR) 20 MG tablet Take 1 tablet (20 mg total) by mouth daily. (Patient taking differently: Take 20 mg by mouth every morning.) 90 tablet 1  ? polyethylene glycol (MIRALAX / GLYCOLAX) 17 g packet Take 17 g by mouth daily as needed for mild constipation. (Patient not taking: Reported on 12/11/2021) 14 each 0  ? potassium chloride (KLOR-CON) 10 MEQ tablet TAKE 1 TABLET BY MOUTH ONCE DAILY. TAKE WITH FUROSEMIDE. (Patient taking differently: Take 10 mEq by mouth daily.) 90 tablet 1  ? traMADol (ULTRAM) 50 MG tablet Take 1 tablet (50 mg total) by mouth every 6 (six) hours as needed. 30 tablet 0  ? triamcinolone ointment (KENALOG) 0.1 % Apply 1 application topically daily as needed (eczema flares).    ? vitamin B-12 (CYANOCOBALAMIN) 1000 MCG tablet Take 1,000 mcg by mouth daily.    ? ?  No current facility-administered medications for this visit.  ? ? ?Patient confirms/reports the following allergies:  ?Allergies  ?Allergen Reactions  ? Latex Hives, Itching and Rash  ?  Redness  ? ? ?No orders of the defined types were placed in this encounter. ? ? ?AUTHORIZATION INFORMATION ?Primary Insurance: ?1D#: ?Group #: ? ?Secondary Insurance: ?1D#: ?Group #: ? ?SCHEDULE INFORMATION: ?Date: 03/18/2022 ?Time: ?Location: [facility] ? ?

## 2022-01-22 ENCOUNTER — Telehealth: Payer: Self-pay | Admitting: Gastroenterology

## 2022-01-22 NOTE — Telephone Encounter (Signed)
Patient states that the bowel prep sent in to her pharmacy is $45 and she is wondering if she can get something else more affordable. ?

## 2022-01-23 ENCOUNTER — Other Ambulatory Visit: Payer: Self-pay

## 2022-01-23 MED ORDER — PEG 3350-KCL-NA BICARB-NACL 420 G PO SOLR: 4000.0000 mL | Freq: Once | ORAL | 0 refills | Status: AC

## 2022-01-23 NOTE — Telephone Encounter (Signed)
Golytely prep has been sent to pharmacy. Instructions included on prep. ?

## 2022-01-23 NOTE — Progress Notes (Signed)
Patient has requested a cheaper prep. Golytely will be sent to pharmacy. ?

## 2022-03-01 ENCOUNTER — Other Ambulatory Visit (HOSPITAL_COMMUNITY): Payer: Self-pay

## 2022-03-06 ENCOUNTER — Telehealth: Payer: Self-pay | Admitting: *Deleted

## 2022-03-06 NOTE — Telephone Encounter (Signed)
Patient called reporting that she got message of appointment 5/1, but she was unaware of it and needs to reschedule it please ?

## 2022-03-11 ENCOUNTER — Inpatient Hospital Stay: Payer: PPO

## 2022-03-12 ENCOUNTER — Inpatient Hospital Stay: Payer: PPO | Attending: Oncology

## 2022-03-12 DIAGNOSIS — D631 Anemia in chronic kidney disease: Secondary | ICD-10-CM | POA: Insufficient documentation

## 2022-03-12 DIAGNOSIS — Z79899 Other long term (current) drug therapy: Secondary | ICD-10-CM | POA: Insufficient documentation

## 2022-03-12 DIAGNOSIS — E538 Deficiency of other specified B group vitamins: Secondary | ICD-10-CM

## 2022-03-12 DIAGNOSIS — N183 Chronic kidney disease, stage 3 unspecified: Secondary | ICD-10-CM | POA: Diagnosis not present

## 2022-03-12 LAB — CBC WITH DIFFERENTIAL/PLATELET
Abs Immature Granulocytes: 0.01 10*3/uL (ref 0.00–0.07)
Basophils Absolute: 0 10*3/uL (ref 0.0–0.1)
Basophils Relative: 1 %
Eosinophils Absolute: 0.2 10*3/uL (ref 0.0–0.5)
Eosinophils Relative: 3 %
HCT: 35.1 % — ABNORMAL LOW (ref 36.0–46.0)
Hemoglobin: 11.4 g/dL — ABNORMAL LOW (ref 12.0–15.0)
Immature Granulocytes: 0 %
Lymphocytes Relative: 35 %
Lymphs Abs: 1.7 10*3/uL (ref 0.7–4.0)
MCH: 30.3 pg (ref 26.0–34.0)
MCHC: 32.5 g/dL (ref 30.0–36.0)
MCV: 93.4 fL (ref 80.0–100.0)
Monocytes Absolute: 0.3 10*3/uL (ref 0.1–1.0)
Monocytes Relative: 5 %
Neutro Abs: 2.7 10*3/uL (ref 1.7–7.7)
Neutrophils Relative %: 56 %
Platelets: 282 10*3/uL (ref 150–400)
RBC: 3.76 MIL/uL — ABNORMAL LOW (ref 3.87–5.11)
RDW: 15.5 % (ref 11.5–15.5)
WBC: 4.8 10*3/uL (ref 4.0–10.5)
nRBC: 0 % (ref 0.0–0.2)

## 2022-03-12 LAB — IRON AND TIBC
Iron: 154 ug/dL (ref 28–170)
Saturation Ratios: 44 % — ABNORMAL HIGH (ref 10.4–31.8)
TIBC: 351 ug/dL (ref 250–450)
UIBC: 197 ug/dL

## 2022-03-12 LAB — COMPREHENSIVE METABOLIC PANEL
ALT: 12 U/L (ref 0–44)
AST: 17 U/L (ref 15–41)
Albumin: 3.8 g/dL (ref 3.5–5.0)
Alkaline Phosphatase: 80 U/L (ref 38–126)
Anion gap: 7 (ref 5–15)
BUN: 30 mg/dL — ABNORMAL HIGH (ref 8–23)
CO2: 26 mmol/L (ref 22–32)
Calcium: 9.3 mg/dL (ref 8.9–10.3)
Chloride: 100 mmol/L (ref 98–111)
Creatinine, Ser: 1.35 mg/dL — ABNORMAL HIGH (ref 0.44–1.00)
GFR, Estimated: 43 mL/min — ABNORMAL LOW (ref >60)
Glucose, Bld: 102 mg/dL — ABNORMAL HIGH (ref 70–99)
Potassium: 4.2 mmol/L (ref 3.5–5.1)
Sodium: 133 mmol/L — ABNORMAL LOW (ref 135–145)
Total Bilirubin: 0.6 mg/dL (ref 0.3–1.2)
Total Protein: 8.2 g/dL — ABNORMAL HIGH (ref 6.5–8.1)

## 2022-03-12 LAB — FERRITIN: Ferritin: 110 ng/mL (ref 11–307)

## 2022-03-12 LAB — VITAMIN B12: Vitamin B-12: 638 pg/mL (ref 180–914)

## 2022-03-13 ENCOUNTER — Inpatient Hospital Stay: Payer: PPO | Admitting: Oncology

## 2022-03-13 ENCOUNTER — Other Ambulatory Visit: Payer: Self-pay

## 2022-03-13 ENCOUNTER — Encounter: Payer: Self-pay | Admitting: Gastroenterology

## 2022-03-14 ENCOUNTER — Inpatient Hospital Stay (HOSPITAL_BASED_OUTPATIENT_CLINIC_OR_DEPARTMENT_OTHER): Payer: PPO | Admitting: Oncology

## 2022-03-14 DIAGNOSIS — D631 Anemia in chronic kidney disease: Secondary | ICD-10-CM

## 2022-03-14 DIAGNOSIS — N183 Chronic kidney disease, stage 3 unspecified: Secondary | ICD-10-CM | POA: Diagnosis not present

## 2022-03-15 ENCOUNTER — Encounter: Payer: Self-pay | Admitting: Gastroenterology

## 2022-03-17 ENCOUNTER — Encounter: Payer: Self-pay | Admitting: Oncology

## 2022-03-17 NOTE — Progress Notes (Signed)
I connected with Autumn Burton on 03/17/22 at 10:00 AM EDT by video enabled telemedicine visit and verified that I am speaking with the correct person using two identifiers. ?  ?I discussed the limitations, risks, security and privacy concerns of performing an evaluation and management service by telemedicine and the availability of in-person appointments. I also discussed with the patient that there may be a patient responsible charge related to this service. The patient expressed understanding and agreed to proceed. ? ?Other persons participating in the visit and their role in the encounter:  none ? ?Patient's location:  home ?Provider's location:  home ? ?Chief Complaint: Normocytic anemia likely secondary to anemia of chronic kidney disease/anemia of chronic disease ? ?History of present illness: patient is a 66 year old female with a past medical history significant for rheumatoid arthritis, obesity hypertension among other medical problems.  She is a resident at [facility] facility.  She has been referred to [place] for anemia. Patient was recently discharged from orthopedic service after total left knee replacement.  Patient's baseline hemoglobin runs around 10-11 since March 2021.  Prior to that her hemoglobin was closer to 12.  However since her knee replacement surgery her hemoglobin dropped from 10-7.1 gradually and had to be given a unit of blood transfusion.  White count and platelets normal.  MCV 97.7.  Patient denies any blood loss in her stool or.  Denies any dark melanotic stools.  Energy levels have been overall stable.  She is able to walk with the help of a walker around the facility.  She has some component of chronic kidney disease and her creatinine typically fluctuates between 1.1- 1.4. ?  ?Results of blood work from 06/18/2021 were as follows: CBC showed white count of 9, H&H of 8.9/28 with a platelet count of 309.  Serum creatinine mildly elevated at 1.29.  Folate levels normal.  B12 levels low  at 239.  Ferritin levels elevated at 376 and iron saturation 19% with a normal TIBC.  Reticulocyte count normal for the degree of anemia at 3.6.  Myeloma panel showed no M protein.  Haptoglobin mildly elevated at 372.  TSH normal. ?  ? ?Interval history patient reports some baseline fatigue but denies new complaints at this time ? ? ?Review of Systems  ?Constitutional:  Positive for malaise/fatigue. Negative for chills, fever and weight loss.  ?HENT:  Negative for congestion, ear discharge and nosebleeds.   ?Eyes:  Negative for blurred vision.  ?Respiratory:  Negative for cough, hemoptysis, sputum production, shortness of breath and wheezing.   ?Cardiovascular:  Negative for chest pain, palpitations, orthopnea and claudication.  ?Gastrointestinal:  Negative for abdominal pain, blood in stool, constipation, diarrhea, heartburn, melena, nausea and vomiting.  ?Genitourinary:  Negative for dysuria, flank pain, frequency, hematuria and urgency.  ?Musculoskeletal:  Negative for back pain, joint pain and myalgias.  ?Skin:  Negative for rash.  ?Neurological:  Negative for dizziness, tingling, focal weakness, seizures, weakness and headaches.  ?Endo/Heme/Allergies:  Does not bruise/bleed easily.  ?Psychiatric/Behavioral:  Negative for depression and suicidal ideas. The patient does not have insomnia.   ? ?Allergies  ?Allergen Reactions  ? Latex Hives, Itching and Rash  ?  Redness  ? ? ?Past Medical History:  ?Diagnosis Date  ? Anemia   ? Arthritis   ? Diabetes mellitus without complication ([place])   ? Eczema   ? GERD (gastroesophageal reflux disease)   ? Gout   ? Headache   ? Hypertension   ? Lymphedema of both lower  extremities   ? being followed by Dr Juan Quam bil  ? Venous ulcer of right leg (HCC)   ? thigh-being followed by dr dew  ? ? ?Past Surgical History:  ?Procedure Laterality Date  ? COLONOSCOPY WITH PROPOFOL N/A 10/28/2016  ? Procedure: COLONOSCOPY WITH PROPOFOL;  Surgeon: Jonathon Bellows, MD;  Location: [facility]  ENDOSCOPY;  Service: Endoscopy;  Laterality: N/A;  ? DG RIGHT TIBIA AND FIBULA ([place] HX)    ? LEG SURGERY Right   ? TOTAL KNEE ARTHROPLASTY Left 06/05/2021  ? Procedure: TOTAL KNEE ARTHROPLASTY;  Surgeon: Hessie Knows, MD;  Location: [facility] ORS;  Service: Orthopedics;  Laterality: Left;  ? TOTAL KNEE ARTHROPLASTY Right 10/30/2021  ? Procedure: TOTAL KNEE ARTHROPLASTY;  Surgeon: Hessie Knows, MD;  Location: [facility] ORS;  Service: Orthopedics;  Laterality: Right;  ? ? ?Social History  ? ?Socioeconomic History  ? Marital status: Widowed  ?  Spouse name: Not on file  ? Number of children: 1  ? Years of education: Not on file  ? Highest education level: Not on file  ?Occupational History  ? Not on file  ?Tobacco Use  ? Smoking status: Never  ? Smokeless tobacco: Never  ?Vaping Use  ? Vaping Use: Never used  ?Substance and Sexual Activity  ? Alcohol use: No  ?  Alcohol/week: 0.0 standard drinks  ? Drug use: No  ? Sexual activity: Not on file  ?Other Topics Concern  ? Not on file  ?Social History Narrative  ? Live alone.  Sisters, son live close by.  ? ?Social Determinants of Health  ? ?Financial Resource Strain: Not on file  ?Food Insecurity: Not on file  ?Transportation Needs: Not on file  ?Physical Activity: Not on file  ?Stress: Not on file  ?Social Connections: Not on file  ?Intimate Partner Violence: Not on file  ? ? ?Family History  ?Problem Relation Age of Onset  ? Diabetes Mother   ? COPD Mother   ? Heart disease Mother   ? Healthy Sister   ? Healthy Son   ? ? ? ?Current Outpatient Medications:  ?  calcium carbonate (TUMS - DOSED IN MG ELEMENTAL CALCIUM) 500 MG chewable tablet, Chew 1 tablet by mouth daily as needed for indigestion or heartburn., Disp: , Rfl:  ?  cholecalciferol (VITAMIN D3) 25 MCG (1000 UNIT) tablet, Take 1,000 Units by mouth daily., Disp: , Rfl:  ?  docusate sodium (COLACE) 100 MG capsule, Take 1 capsule (100 mg total) by mouth 2 (two) times daily. (Patient not taking: Reported on 10/15/2021), Disp:  10 capsule, Rfl: 0 ?  ferrous sulfate 325 (65 FE) MG tablet, Take 325 mg by mouth daily with breakfast., Disp: , Rfl:  ?  folic acid (FOLVITE) 1 MG tablet, Take 1 mg by mouth See admin instructions. Take 1 mg by mouth daily except for Sunday, Disp: , Rfl:  ?  furosemide (LASIX) 20 MG tablet, Take 1 tablet (20 mg total) by mouth daily., Disp: 90 tablet, Rfl: 1 ?  gabapentin (NEURONTIN) 300 MG capsule, Take 300 mg by mouth 3 (three) times daily., Disp: , Rfl:  ?  halobetasol (ULTRAVATE) 0.05 % ointment, Apply 1 application topically 2 (two) times daily as needed (irritation). (Patient not taking: Reported on 12/11/2021), Disp: , Rfl:  ?  hydrochlorothiazide (HYDRODIURIL) 25 MG tablet, Take 1 tablet (25 mg total) by mouth daily., Disp: 90 tablet, Rfl: 1 ?  HYDROcodone-acetaminophen (NORCO) 7.5-325 MG tablet, Take 1-2 tablets by mouth every 4 (four) hours as  needed for severe pain (pain score 7-10). (Patient not taking: Reported on 12/11/2021), Disp: 30 tablet, Rfl: 0 ?  methocarbamol (ROBAXIN) 500 MG tablet, Take 1 tablet (500 mg total) by mouth every 6 (six) hours as needed for muscle spasms., Disp: , Rfl:  ?  methotrexate 2.5 MG tablet, Take 15 mg by mouth every Sunday., Disp: , Rfl:  ?  metoprolol tartrate (LOPRESSOR) 25 MG tablet, Take 1 tablet (25 mg total) by mouth daily. (Patient taking differently: Take 25 mg by mouth every morning.), Disp: 90 tablet, Rfl: 1 ?  olmesartan (BENICAR) 20 MG tablet, Take 1 tablet (20 mg total) by mouth daily. (Patient taking differently: Take 20 mg by mouth every morning.), Disp: 90 tablet, Rfl: 1 ?  polyethylene glycol (MIRALAX / GLYCOLAX) 17 g packet, Take 17 g by mouth daily as needed for mild constipation. (Patient not taking: Reported on 12/11/2021), Disp: 14 each, Rfl: 0 ?  potassium chloride (KLOR-CON) 10 MEQ tablet, TAKE 1 TABLET BY MOUTH ONCE DAILY. TAKE WITH FUROSEMIDE. (Patient taking differently: Take 10 mEq by mouth daily.), Disp: 90 tablet, Rfl: 1 ?  traMADol (ULTRAM) 50  MG tablet, Take 1 tablet (50 mg total) by mouth every 6 (six) hours as needed., Disp: 30 tablet, Rfl: 0 ?  triamcinolone ointment (KENALOG) 0.1 %, Apply 1 application topically daily as needed (eczema flar

## 2022-03-18 ENCOUNTER — Ambulatory Visit
Admission: RE | Admit: 2022-03-18 | Discharge: 2022-03-18 | Disposition: A | Discharge status: Home / Self Care | Payer: PPO | Attending: Gastroenterology | Admitting: Gastroenterology

## 2022-03-18 ENCOUNTER — Other Ambulatory Visit: Payer: Self-pay

## 2022-03-18 ENCOUNTER — Ambulatory Visit: Payer: PPO | Admitting: Anesthesiology

## 2022-03-18 ENCOUNTER — Encounter: Payer: Self-pay | Admitting: Gastroenterology

## 2022-03-18 ENCOUNTER — Encounter
Admission: RE | Disposition: A | Discharge status: Home / Self Care | Payer: Self-pay | Source: Home / Self Care | Attending: Gastroenterology

## 2022-03-18 DIAGNOSIS — M069 Rheumatoid arthritis, unspecified: Secondary | ICD-10-CM | POA: Insufficient documentation

## 2022-03-18 DIAGNOSIS — Z86010 Personal history of colonic polyps: Secondary | ICD-10-CM

## 2022-03-18 DIAGNOSIS — Z79899 Other long term (current) drug therapy: Secondary | ICD-10-CM | POA: Diagnosis not present

## 2022-03-18 DIAGNOSIS — I129 Hypertensive chronic kidney disease with stage 1 through stage 4 chronic kidney disease, or unspecified chronic kidney disease: Secondary | ICD-10-CM | POA: Diagnosis not present

## 2022-03-18 DIAGNOSIS — I890 Lymphedema, not elsewhere classified: Secondary | ICD-10-CM | POA: Insufficient documentation

## 2022-03-18 DIAGNOSIS — Z6841 Body Mass Index (BMI) 40.0 and over, adult: Secondary | ICD-10-CM | POA: Diagnosis not present

## 2022-03-18 DIAGNOSIS — K219 Gastro-esophageal reflux disease without esophagitis: Secondary | ICD-10-CM | POA: Insufficient documentation

## 2022-03-18 DIAGNOSIS — D649 Anemia, unspecified: Secondary | ICD-10-CM | POA: Insufficient documentation

## 2022-03-18 DIAGNOSIS — Z8719 Personal history of other diseases of the digestive system: Secondary | ICD-10-CM | POA: Insufficient documentation

## 2022-03-18 DIAGNOSIS — E1122 Type 2 diabetes mellitus with diabetic chronic kidney disease: Secondary | ICD-10-CM | POA: Diagnosis not present

## 2022-03-18 DIAGNOSIS — N183 Chronic kidney disease, stage 3 unspecified: Secondary | ICD-10-CM | POA: Diagnosis not present

## 2022-03-18 DIAGNOSIS — Z1211 Encounter for screening for malignant neoplasm of colon: Secondary | ICD-10-CM | POA: Insufficient documentation

## 2022-03-18 DIAGNOSIS — G709 Myoneural disorder, unspecified: Secondary | ICD-10-CM | POA: Diagnosis not present

## 2022-03-18 HISTORY — DX: Headache, unspecified: R51.9

## 2022-03-18 HISTORY — DX: Chronic kidney disease, unspecified: N18.9

## 2022-03-18 HISTORY — DX: Gout, unspecified: M10.9

## 2022-03-18 HISTORY — DX: Dermatitis, unspecified: L30.9

## 2022-03-18 HISTORY — DX: Type 2 diabetes mellitus without complications: E11.9

## 2022-03-18 HISTORY — PX: COLONOSCOPY WITH PROPOFOL: SHX5780

## 2022-03-18 HISTORY — DX: Morbid (severe) obesity due to excess calories: E66.01

## 2022-03-18 SURGERY — COLONOSCOPY WITH PROPOFOL
Anesthesia: General

## 2022-03-18 MED ORDER — SIMETHICONE 40 MG/0.6ML PO SUSP
ORAL | Status: DC | PRN
  Administered 2022-03-18: 60 mL

## 2022-03-18 MED ORDER — PROPOFOL 10 MG/ML IV BOLUS
INTRAVENOUS | Status: DC | PRN
  Administered 2022-03-18: 70 mg via INTRAVENOUS
  Administered 2022-03-18: 110 ug/kg/min via INTRAVENOUS

## 2022-03-18 MED ORDER — SODIUM CHLORIDE 0.9 % IV SOLN: INTRAVENOUS | Status: DC

## 2022-03-18 NOTE — Anesthesia Preprocedure Evaluation (Addendum)
Anesthesia Evaluation  ?Patient identified by MRN, date of birth, ID band ?Patient awake ? ? ? ?Reviewed: ?Allergy & Precautions, H&P , NPO status , Patient's Chart, lab work & pertinent test results, reviewed documented beta blocker date and time  ? ?Airway ?Mallampati: III ? ? ?Neck ROM: full ? ? ? Dental ? ?(+) Upper Dentures, Partial Lower ?  ?Pulmonary ?neg pulmonary ROS,  ?  ? ?+ decreased breath sounds ? ? ? ? ? Cardiovascular ?Exercise Tolerance: Poor ?hypertension, On Medications and Pt. on home beta blockers ? ?Rhythm:regular Rate:Normal ?+ Peripheral Edema ?Lymphedema of both lower extremities ?  ?Neuro/Psych ? Neuromuscular disease negative psych ROS  ? GI/Hepatic ?Neg liver ROS, GERD  Controlled,  ?Endo/Other  ?diabetesHyperthyroidism  ? Renal/GU ?Renal InsufficiencyRenal disease  ?negative genitourinary ?  ?Musculoskeletal ? ?(+) Arthritis , Rheumatoid disorders,   ? Abdominal ?(+) + obese,   ?Peds ? Hematology ? ?(+) Blood dyscrasia, anemia ,   ?Anesthesia Other Findings ?Past Medical History: ?No date: Anemia ?No date: Arthritis ?No date: GERD (gastroesophageal reflux disease) ?No date: Hypertension ?No date: Lymphedema of both lower extremities ?    Comment:  being followed by Dr Juan Quam bil ?No date: Venous ulcer of right leg (HCC) ?    Comment:  thigh-being followed by dr dew ?Past Surgical History: ?10/28/2016: COLONOSCOPY WITH PROPOFOL; N/A ?    Comment:  Procedure: COLONOSCOPY WITH PROPOFOL;  Surgeon: Bailey Mech  ?             Vicente Males, MD;  Location: [facility] ENDOSCOPY;  Service: Endoscopy; ?             Laterality: N/A; ?No date: DG RIGHT TIBIA AND FIBULA ([place] HX) ?No date: LEG SURGERY; Right ?06/05/2021: TOTAL KNEE ARTHROPLASTY; Left ?    Comment:  Procedure: TOTAL KNEE ARTHROPLASTY;  Surgeon: Rudene Christians,  ?             Legrand Como, MD;  Location: [facility] ORS;  Service: Orthopedics;  ?             Laterality: Left; ? ? Reproductive/Obstetrics ?negative OB ROS ? ?   ? ? ? ? ? ? ? ? ? ? ? ? ? ?  ?  ? ? ? ? ? ? ? ?Anesthesia Physical ? ?Anesthesia Plan ? ?ASA: 3 ? ?Anesthesia Plan: General  ? ?Post-op Pain Management:   ? ?Induction: Intravenous ? ?PONV Risk Score and Plan: Propofol infusion, Treatment may vary due to age or medical condition and TIVA ? ?Airway Management Planned: Natural Airway and Simple Face Mask ? ?Additional Equipment:  ? ?Intra-op Plan:  ? ?Post-operative Plan:  ? ?Informed Consent: I have reviewed the patients History and Physical, chart, labs and discussed the procedure including the risks, benefits and alternatives for the proposed anesthesia with the patient or authorized representative who has indicated his/her understanding and acceptance.  ? ? ? ?Dental Advisory Given and Dental advisory given ? ?Plan Discussed with: CRNA ? ?Anesthesia Plan Comments:   ? ? ? ? ? ?Anesthesia Quick Evaluation ? ?

## 2022-03-18 NOTE — Transfer of Care (Signed)
Immediate Anesthesia Transfer of Care Note ? ?Patient: [name] ? ?Procedure(s) Performed: COLONOSCOPY WITH PROPOFOL ? ?Patient Location: PACU ? ?Anesthesia Type:General ? ?Level of Consciousness: awake, alert , oriented and patient cooperative ? ?Airway & Oxygen Therapy: Patient Spontanous Breathing and Patient connected to nasal cannula oxygen ? ?Post-op Assessment: Report given to RN and Post -op Vital signs reviewed and stable ? ?Post vital signs: Reviewed and stable ? ?Last Vitals:  ?Vitals Value Taken Time  ?BP 115/102 03/18/22 1131  ?Temp 36.3 ?C 03/18/22 1131  ?Pulse 120 03/18/22 1134  ?Resp 16 03/18/22 1134  ?SpO2 100 % 03/18/22 1134  ?Vitals shown include unvalidated device data. ? ?Last Pain:  ?Vitals:  ? 03/18/22 1131  ?TempSrc: Temporal  ?PainSc: 0-No pain  ?   ? ?  ? ?Complications: No notable events documented. ?

## 2022-03-18 NOTE — Anesthesia Procedure Notes (Signed)
Date/Time: 03/18/2022 11:12 AM ?Performed by: Gentry Fitz, CRNA ?Pre-anesthesia Checklist: Patient identified, Emergency Drugs available, Suction available, Patient being monitored and Timeout performed ?Patient Re-evaluated:Patient Re-evaluated prior to induction ?Oxygen Delivery Method: Nasal cannula ?Preoxygenation: Pre-oxygenation with 100% oxygen ?Comments: Patient with SV throughout case ? ? ? ? ?

## 2022-03-18 NOTE — Op Note (Signed)
[facility] ?Gastroenterology ?Patient Name: Autumn Burton ?Procedure Date: 03/18/2022 11:07 AM ?MRN: [id number] ?Account #: [id number] ?Date of Birth: 1956-08-21 ?Admit Type: Outpatient ?Age: 66 ?Room: [facility] ENDO ROOM 4 ?Gender: Female ?Note Status: Finalized ?Instrument Name: Colonoscope [id number] ?Procedure:             Colonoscopy ?Indications:           Surveillance: Personal history of colonic polyps  ?                       (unknown histology) on last colonoscopy 5 years ago ?Providers:             Jonathon Bellows MD, MD ?Referring MD:          Gladstone Lighter, MD (Referring MD) ?Medicines:             Monitored Anesthesia Care ?Complications:         No immediate complications. ?Procedure:             Pre-Anesthesia Assessment: ?                       - Prior to the procedure, a History and Physical was  ?                       performed, and patient medications, allergies and  ?                       sensitivities were reviewed. The patient's tolerance  ?                       of previous anesthesia was reviewed. ?                       - The risks and benefits of the procedure and the  ?                       sedation options and risks were discussed with the  ?                       patient. All questions were answered and informed  ?                       consent was obtained. ?                       - ASA Grade Assessment: II - A patient with mild  ?                       systemic disease. ?                       After obtaining informed consent, the colonoscope was  ?                       passed under direct vision. Throughout the procedure,  ?                       the patient's blood pressure, pulse, and oxygen  ?  saturations were monitored continuously. The  ?                       Colonoscope was introduced through the anus and  ?                       advanced to the the cecum, identified by the  ?                       appendiceal orifice. The colonoscopy was  performed  ?                       with ease. The patient tolerated the procedure well.  ?                       The quality of the bowel preparation was good. ?Findings: ?     The perianal and digital rectal examinations were normal. ?     The entire examined colon appeared normal on direct and retroflexion  ?     views. ?Impression:            - The entire examined colon is normal on direct and  ?                       retroflexion views. ?                       - No specimens collected. ?Recommendation:        - Discharge patient to home (with escort). ?                       - Resume previous diet. ?                       - Continue present medications. ?                       - Repeat colonoscopy is not recommended due to current  ?                       age (41 years or older) for surveillance. ?Procedure Code(s):     --- Professional --- ?                       [phone], Colonoscopy, flexible; diagnostic, including  ?                       collection of specimen(s) by brushing or washing, when  ?                       performed (separate procedure) ?Diagnosis Code(s):     --- Professional --- ?                       Z86.010, Personal history of colonic polyps ?CPT copyright 2019 American Medical Association. All rights reserved. ?The codes documented in this report are preliminary and upon coder review may  ?be revised to meet current compliance requirements. ?Jonathon Bellows, MD ?Jonathon Bellows MD, MD ?03/18/2022 11:29:26 AM ?This report has been signed electronically. ?Number of Addenda: 0 ?Note Initiated On: 03/18/2022 11:07 AM ?Scope Withdrawal Time: 0  hours 7 minutes 54 seconds  ?Total Procedure Duration: 0 hours 11 minutes 25 seconds  ?Estimated Blood Loss:  Estimated blood loss: none. ?     [facility] ?

## 2022-03-18 NOTE — H&P (Signed)
?Jonathon Bellows, MD ?[address] [place], [place], [place], [address] ?[address] [place], [place], [place], [address] ?Phone: [phone]  ?Fax: [phone] ? ?Primary Care Physician:  Gladstone Lighter, MD ? ? ?Pre-Procedure History & Physical: ?HPI:  Autumn Burton is a 66 y.o. female is here for an colonoscopy. ?  ?Past Medical History:  ?Diagnosis Date  ? Anemia   ? Arthritis   ? Chronic kidney disease   ? stage 3  ? Diabetes mellitus without complication ([place])   ? Eczema   ? GERD (gastroesophageal reflux disease)   ? Gout   ? Headache   ? Hypertension   ? Lymphedema of both lower extremities   ? being followed by Dr Juan Quam bil  ? Morbid obesity with BMI of 45.0-49.9, adult ([place])   ? Venous ulcer of right leg (HCC)   ? thigh-being followed by dr dew  ? ? ?Past Surgical History:  ?Procedure Laterality Date  ? COLONOSCOPY WITH PROPOFOL N/A 10/28/2016  ? Procedure: COLONOSCOPY WITH PROPOFOL;  Surgeon: Jonathon Bellows, MD;  Location: [facility] ENDOSCOPY;  Service: Endoscopy;  Laterality: N/A;  ? DG RIGHT TIBIA AND FIBULA ([place] HX)    ? LEG SURGERY Right   ? skin grafting to right thigh    ? TOTAL KNEE ARTHROPLASTY Left 06/05/2021  ? Procedure: TOTAL KNEE ARTHROPLASTY;  Surgeon: Hessie Knows, MD;  Location: [facility] ORS;  Service: Orthopedics;  Laterality: Left;  ? TOTAL KNEE ARTHROPLASTY Right 10/30/2021  ? Procedure: TOTAL KNEE ARTHROPLASTY;  Surgeon: Hessie Knows, MD;  Location: [facility] ORS;  Service: Orthopedics;  Laterality: Right;  ? ? ?Prior to Admission medications   ?Medication Sig Start Date End Date Taking? Authorizing Provider  ?aspirin EC 81 MG tablet Take 81 mg by mouth daily. Swallow whole.   Yes [provider]  ?hydrochlorothiazide (HYDRODIURIL) 25 MG tablet Take 1 tablet (25 mg total) by mouth daily. 11/15/20  Yes Mar Daring, PA-C  ?metoprolol tartrate (LOPRESSOR) 25 MG tablet Take 1 tablet (25 mg total) by mouth daily. ?Patient taking differently: Take 25 mg by mouth every  morning. 11/15/20  Yes Mar Daring, PA-C  ?olmesartan (BENICAR) 20 MG tablet Take 1 tablet (20 mg total) by mouth daily. ?Patient taking differently: Take 20 mg by mouth every morning. 11/15/20  Yes Mar Daring, PA-C  ?calcium carbonate (TUMS - DOSED IN MG ELEMENTAL CALCIUM) 500 MG chewable tablet Chew 1 tablet by mouth daily as needed for indigestion or heartburn.    [provider]  ?cholecalciferol (VITAMIN D3) 25 MCG (1000 UNIT) tablet Take 1,000 Units by mouth daily.    [provider]  ?docusate sodium (COLACE) 100 MG capsule Take 1 capsule (100 mg total) by mouth 2 (two) times daily. 06/08/21   Duanne Guess, PA-C  ?ferrous sulfate 325 (65 FE) MG tablet Take 325 mg by mouth daily with breakfast.    [provider]  ?folic acid (FOLVITE) 1 MG tablet Take 1 mg by mouth See admin instructions. Take 1 mg by mouth daily except for Sunday 01/25/20   [provider]  ?furosemide (LASIX) 20 MG tablet Take 1 tablet (20 mg total) by mouth daily. 11/15/20   Mar Daring, PA-C  ?gabapentin (NEURONTIN) 300 MG capsule Take 300 mg by mouth 3 (three) times daily. 09/09/19   [provider]  ?halobetasol (ULTRAVATE) 0.05 % ointment Apply 1 application topically 2 (two) times daily as needed (irritation). ?Patient not taking: Reported on 12/11/2021 10/24/20  [provider]  ?HYDROcodone-acetaminophen (NORCO) 7.5-325 MG tablet Take 1-2 tablets by mouth every 4 (four) hours as needed for severe pain (pain score 7-10). 11/01/21   Duanne Guess, PA-C  ?methocarbamol (ROBAXIN) 500 MG tablet Take 1 tablet (500 mg total) by mouth every 6 (six) hours as needed for muscle spasms. 11/01/21   Duanne Guess, PA-C  ?methotrexate 2.5 MG tablet Take 15 mg by mouth every Sunday. 02/23/20   [provider]  ?polyethylene glycol (MIRALAX / GLYCOLAX) 17 g packet Take 17 g by mouth daily as needed for mild constipation. 11/01/21   Duanne Guess, PA-C   ?potassium chloride (KLOR-CON) 10 MEQ tablet TAKE 1 TABLET BY MOUTH ONCE DAILY. TAKE WITH FUROSEMIDE. ?Patient taking differently: Take 10 mEq by mouth daily. 11/15/20   Mar Daring, PA-C  ?traMADol (ULTRAM) 50 MG tablet Take 1 tablet (50 mg total) by mouth every 6 (six) hours as needed. 11/01/21   Duanne Guess, PA-C  ?triamcinolone ointment (KENALOG) 0.1 % Apply 1 application topically daily as needed (eczema flares). 05/23/20   [provider]  ?vitamin B-12 (CYANOCOBALAMIN) 1000 MCG tablet Take 1,000 mcg by mouth daily.    [provider]  ? ? ?Allergies as of 01/16/2022 - Review Complete 12/11/2021  ?Allergen Reaction Noted  ? Latex Hives, Itching, and Rash 10/22/2019  ? ? ?Family History  ?Problem Relation Age of Onset  ? Diabetes Mother   ? COPD Mother   ? Heart disease Mother   ? Healthy Sister   ? Healthy Son   ? ? ?Social History  ? ?Socioeconomic History  ? Marital status: Widowed  ?  Spouse name: Not on file  ? Number of children: 1  ? Years of education: Not on file  ? Highest education level: Not on file  ?Occupational History  ? Not on file  ?Tobacco Use  ? Smoking status: Never  ? Smokeless tobacco: Never  ?Vaping Use  ? Vaping Use: Never used  ?Substance and Sexual Activity  ? Alcohol use: No  ?  Alcohol/week: 0.0 standard drinks  ? Drug use: No  ? Sexual activity: Not on file  ?Other Topics Concern  ? Not on file  ?Social History Narrative  ? Live alone.  Sisters, son live close by.  ? ?Social Determinants of Health  ? ?Financial Resource Strain: Not on file  ?Food Insecurity: Not on file  ?Transportation Needs: Not on file  ?Physical Activity: Not on file  ?Stress: Not on file  ?Social Connections: Not on file  ?Intimate Partner Violence: Not on file  ? ? ?Review of Systems: ?See HPI, otherwise negative ROS ? ?Physical Exam: ?BP 140/75   Pulse 80   Temp (!) 97.2 ?F (36.2 ?C) (Temporal)   Resp 20   Ht '5\' 2"'$  (1.575 m)   Wt 122.5 kg   SpO2 100%   BMI 49.40 kg/m?   ?General:   Alert,  pleasant and cooperative in NAD ?Head:  Normocephalic and atraumatic. ?Neck:  Supple; no masses or thyromegaly. ?Lungs:  Clear throughout to auscultation, normal respiratory effort.    ?Heart:  +S1, +S2, Regular rate and rhythm, No edema. ?Abdomen:  Soft, nontender and nondistended. Normal bowel sounds, without guarding, and without rebound.   ?Neurologic:  Alert and  oriented x4;  grossly normal neurologically. ? ?Impression/Plan: ?Autumn Burton is here for an colonoscopy to be performed for surveillance due to prior history of colon polyps  ? ?Risks, benefits, limitations, and  alternatives regarding  colonoscopy have been reviewed with the patient.  Questions have been answered.  All parties agreeable. ? ? ?Jonathon Bellows, MD  03/18/2022, 10:25 AM ? ?

## 2022-03-18 NOTE — Anesthesia Postprocedure Evaluation (Signed)
Anesthesia Post Note ? ?Patient: Autumn Burton ? ?Procedure(s) Performed: COLONOSCOPY WITH PROPOFOL ? ?Patient location during evaluation: Endoscopy ?Anesthesia Type: General ?Level of consciousness: awake and alert ?Pain management: pain level controlled ?Vital Signs Assessment: post-procedure vital signs reviewed and stable ?Respiratory status: spontaneous breathing, nonlabored ventilation and respiratory function stable ?Cardiovascular status: blood pressure returned to baseline and stable ?Postop Assessment: no apparent nausea or vomiting ?Anesthetic complications: no ? ? ?No notable events documented. ? ? ?Last Vitals:  ?Vitals:  ? 03/18/22 0959 03/18/22 1131  ?BP: 140/75 (!) 115/102  ?Pulse: 80 88  ?Resp: 20 (!) 22  ?Temp: (!) 36.2 ?C (!) 36.3 ?C  ?SpO2: 100% 100%  ?  ?Last Pain:  ?Vitals:  ? 03/18/22 1151  ?TempSrc:   ?PainSc: 0-No pain  ? ? ?  ?  ?  ?  ?  ?  ? ?[place] Ouch ? ? ? ? ?

## 2022-03-19 ENCOUNTER — Encounter: Payer: Self-pay | Admitting: Gastroenterology

## 2022-04-30 DIAGNOSIS — Z79899 Other long term (current) drug therapy: Secondary | ICD-10-CM | POA: Diagnosis not present

## 2022-04-30 DIAGNOSIS — L2084 Intrinsic (allergic) eczema: Secondary | ICD-10-CM | POA: Diagnosis not present

## 2022-05-08 DIAGNOSIS — L309 Dermatitis, unspecified: Secondary | ICD-10-CM | POA: Diagnosis not present

## 2022-05-08 DIAGNOSIS — K1230 Oral mucositis (ulcerative), unspecified: Secondary | ICD-10-CM | POA: Diagnosis not present

## 2022-05-08 DIAGNOSIS — L2489 Irritant contact dermatitis due to other agents: Secondary | ICD-10-CM | POA: Diagnosis not present

## 2022-05-08 DIAGNOSIS — L304 Erythema intertrigo: Secondary | ICD-10-CM | POA: Diagnosis not present

## 2022-05-21 DIAGNOSIS — M069 Rheumatoid arthritis, unspecified: Secondary | ICD-10-CM | POA: Diagnosis not present

## 2022-05-21 DIAGNOSIS — I10 Essential (primary) hypertension: Secondary | ICD-10-CM | POA: Diagnosis not present

## 2022-05-21 DIAGNOSIS — E119 Type 2 diabetes mellitus without complications: Secondary | ICD-10-CM | POA: Diagnosis not present

## 2022-05-21 DIAGNOSIS — I890 Lymphedema, not elsewhere classified: Secondary | ICD-10-CM | POA: Diagnosis not present

## 2022-05-22 ENCOUNTER — Encounter: Payer: Self-pay | Admitting: Oncology

## 2022-07-30 DIAGNOSIS — L304 Erythema intertrigo: Secondary | ICD-10-CM | POA: Diagnosis not present

## 2022-07-30 DIAGNOSIS — L309 Dermatitis, unspecified: Secondary | ICD-10-CM | POA: Diagnosis not present

## 2022-07-30 DIAGNOSIS — L2084 Intrinsic (allergic) eczema: Secondary | ICD-10-CM | POA: Diagnosis not present

## 2022-07-30 DIAGNOSIS — Z79899 Other long term (current) drug therapy: Secondary | ICD-10-CM | POA: Diagnosis not present

## 2022-07-30 DIAGNOSIS — I878 Other specified disorders of veins: Secondary | ICD-10-CM | POA: Diagnosis not present

## 2022-08-18 DIAGNOSIS — J209 Acute bronchitis, unspecified: Secondary | ICD-10-CM | POA: Diagnosis not present

## 2022-08-20 DIAGNOSIS — J209 Acute bronchitis, unspecified: Secondary | ICD-10-CM | POA: Diagnosis not present

## 2022-08-20 DIAGNOSIS — I10 Essential (primary) hypertension: Secondary | ICD-10-CM | POA: Diagnosis not present

## 2022-08-20 DIAGNOSIS — E119 Type 2 diabetes mellitus without complications: Secondary | ICD-10-CM | POA: Diagnosis not present

## 2022-09-09 ENCOUNTER — Encounter (INDEPENDENT_AMBULATORY_CARE_PROVIDER_SITE_OTHER): Payer: Self-pay

## 2022-09-16 ENCOUNTER — Inpatient Hospital Stay: Payer: PPO | Attending: Oncology

## 2022-09-16 ENCOUNTER — Telehealth: Payer: Self-pay | Admitting: Oncology

## 2022-09-16 IMAGING — US US PELVIS COMPLETE WITH TRANSVAGINAL
1 series · 13 of 25 positions shown · non-contrast
Comparison: None

CLINICAL DATA: Vaginal bleeding for 12 hours

EXAM:
TRANSABDOMINAL AND TRANSVAGINAL ULTRASOUND OF PELVIS
TECHNIQUE: Both transabdominal and transvaginal ultrasound examinations of the
pelvis were performed. Transabdominal technique was performed for
global imaging of the pelvis including uterus, ovaries, adnexal
regions, and pelvic cul-de-sac. It was necessary to proceed with
endovaginal exam following the transabdominal exam to visualize the
uterus and endometrium.

[Series 1: us pelvic complete with transvaginal · 13 of 81 slices shown]
[im 1/81]
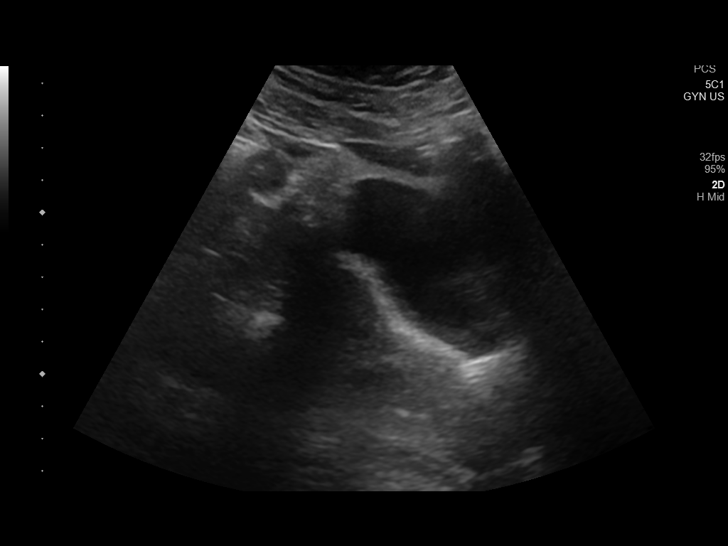
[im 7/81]
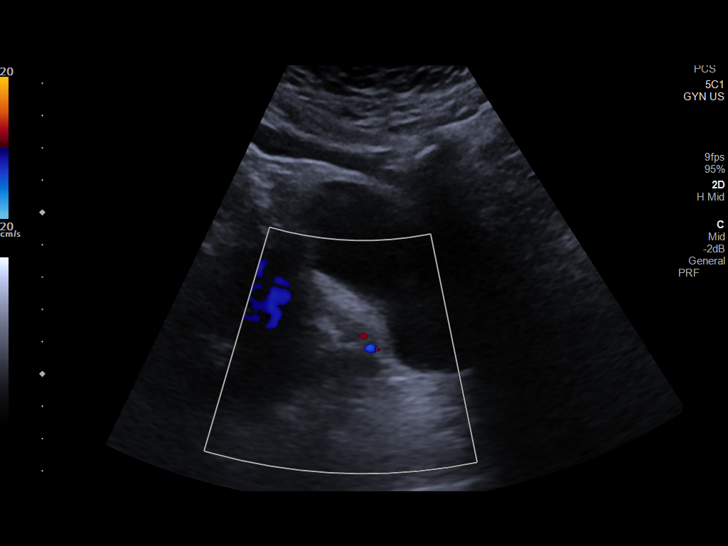
[im 14/81]
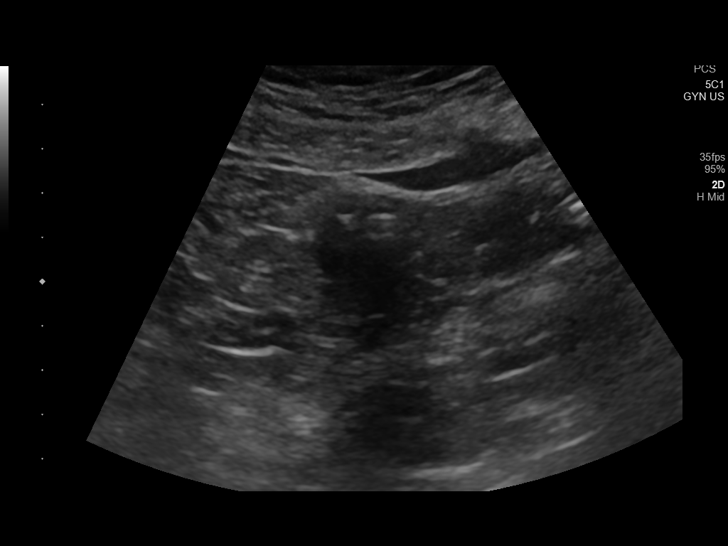
[im 21/81]
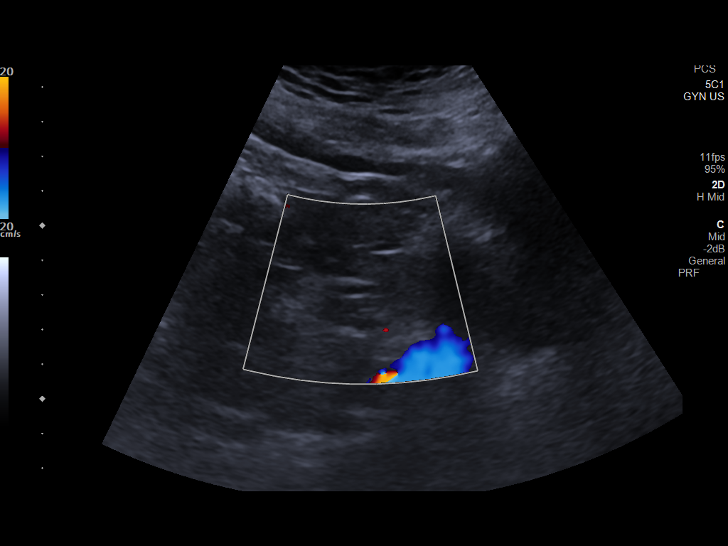
[im 27/81]
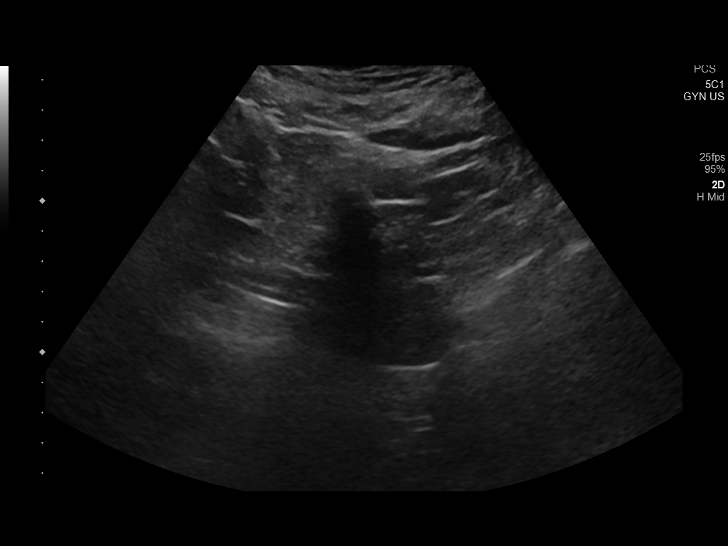
[im 34/81]
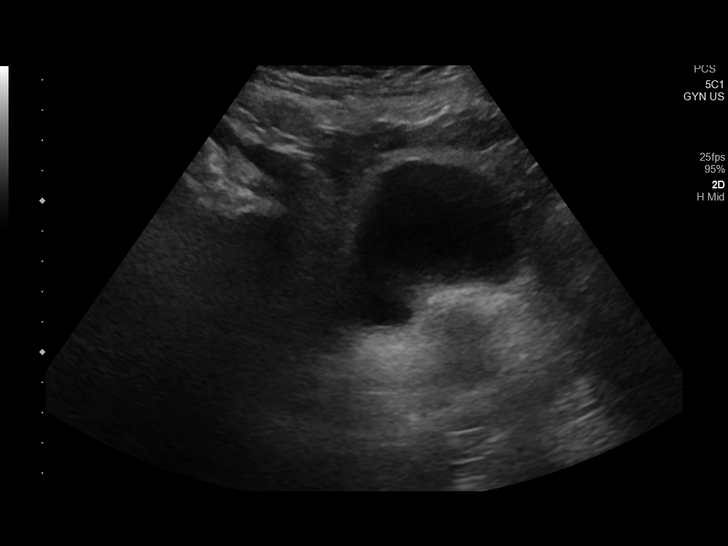
[im 41/81]
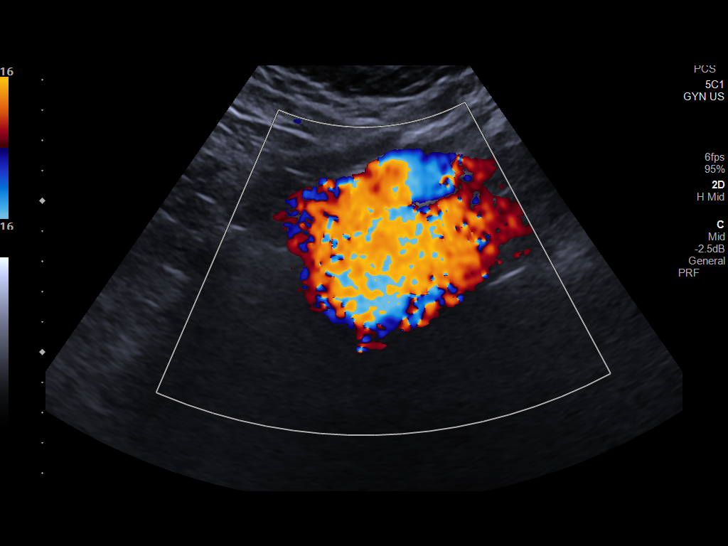
[im 47/81]
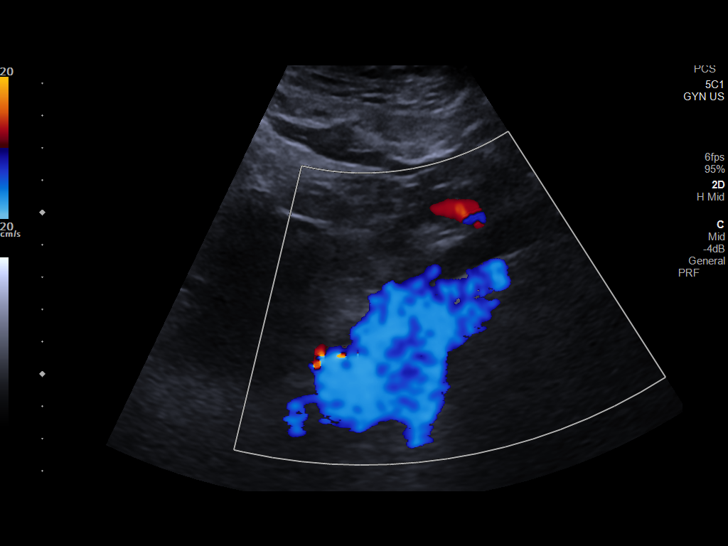
[im 54/81]
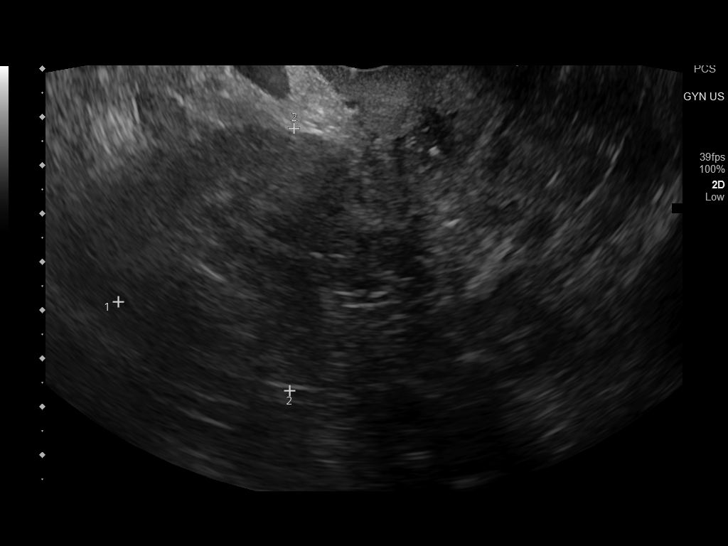
[im 61/81]
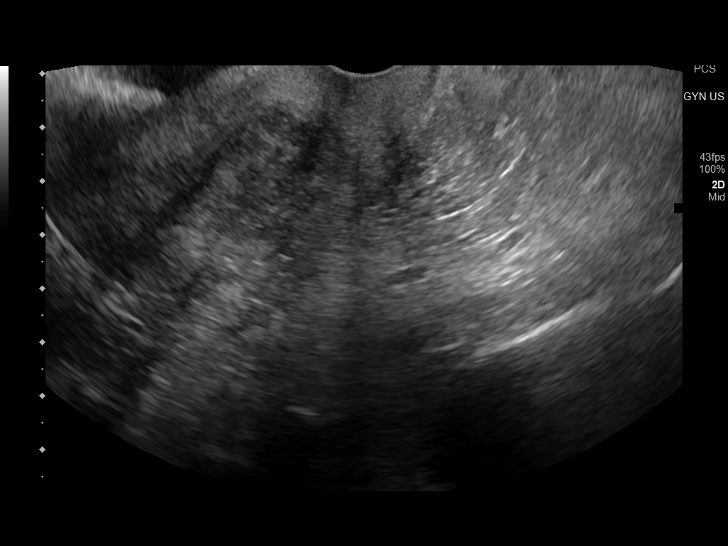
[im 67/81]
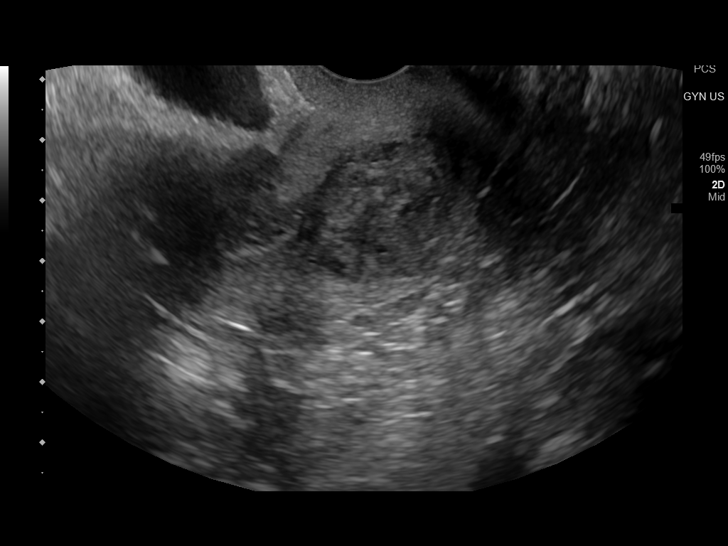
[im 74/81]
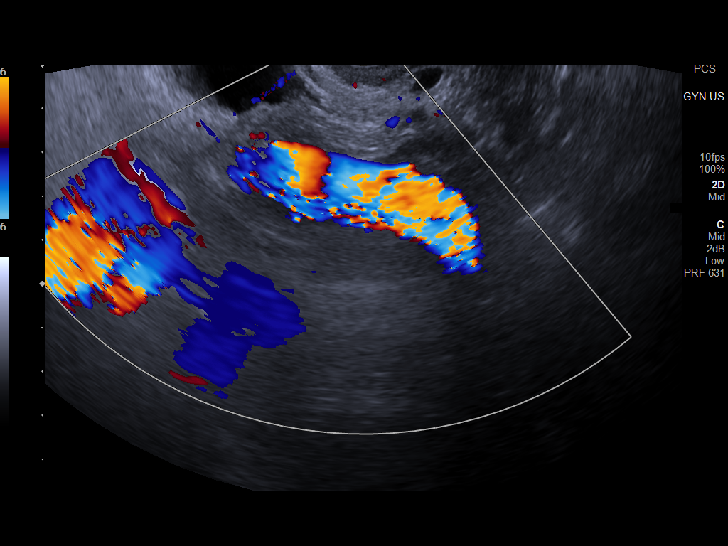
[im 81/81]
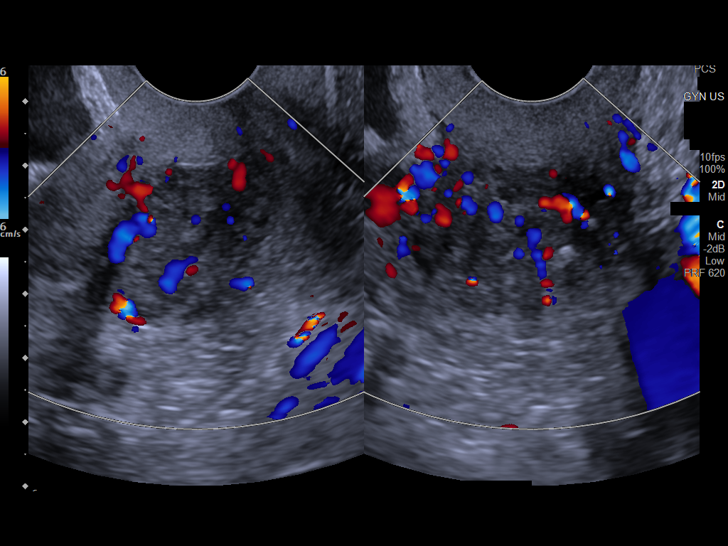

[13 of 25 positions shown; findings below may reference images not displayed]

FINDINGS: Uterus

Measurements: 9.7 x 5.4 x 8.0 cm = volume: 220 mL. Multiple uterine
fibroids of the uterine body and fundus. In the posterior lower
uterine body, there is a submucosal fibroid or polyp measuring
cm.

Endometrium

Thickness: 9 mm.  No focal abnormality visualized.

Right ovary

Not visualized

Left ovary

Not visualized.

Other findings

No abnormal free fluid.
IMPRESSION: 1. Multiple uterine fibroids. In the posterior lower uterine body,
there is a submucosal fibroid or polyp measuring 3.2 cm. This
possibly explain vaginal bleeding. Consider hysteroscopy to further
evaluate.
2. The endometrium measures 9 mm in thickness, abnormally thickened
in the setting of postmenopausal bleeding and concerning for
endometrial hyperplasia or endometrial malignancy.
3. Nonvisualization of the bilateral ovaries.

## 2022-09-16 NOTE — Telephone Encounter (Signed)
Patient has lab appointment Thursday at [facility] at 8:15 and would like to have all labs at the same time. Would someone be able to let her know what labs she will need drawn so she can request them at the same time. [phone].

## 2022-09-18 ENCOUNTER — Telehealth: Payer: Self-pay | Admitting: *Deleted

## 2022-09-18 NOTE — Telephone Encounter (Signed)
Called [facility] and spoke to April and they said it is fine to the the other labs for [place]. She already had cbc/d on for the PCP, so we added ferirtin and IIBC. And faxed it over to [phone]. I called the patient and she is aware to get all of them at [facility] tom

## 2022-09-19 DIAGNOSIS — Z79899 Other long term (current) drug therapy: Secondary | ICD-10-CM | POA: Diagnosis not present

## 2022-09-19 DIAGNOSIS — R710 Precipitous drop in hematocrit: Secondary | ICD-10-CM | POA: Diagnosis not present

## 2022-09-26 DIAGNOSIS — Z23 Encounter for immunization: Secondary | ICD-10-CM | POA: Diagnosis not present

## 2022-09-26 DIAGNOSIS — Z780 Asymptomatic menopausal state: Secondary | ICD-10-CM | POA: Diagnosis not present

## 2022-09-26 DIAGNOSIS — Z0000 Encounter for general adult medical examination without abnormal findings: Secondary | ICD-10-CM | POA: Diagnosis not present

## 2022-09-26 DIAGNOSIS — N1831 Chronic kidney disease, stage 3a: Secondary | ICD-10-CM | POA: Diagnosis not present

## 2022-09-26 DIAGNOSIS — M19271 Secondary osteoarthritis, right ankle and foot: Secondary | ICD-10-CM | POA: Diagnosis not present

## 2022-09-26 DIAGNOSIS — E119 Type 2 diabetes mellitus without complications: Secondary | ICD-10-CM | POA: Diagnosis not present

## 2022-09-30 ENCOUNTER — Other Ambulatory Visit: Payer: Self-pay | Admitting: Internal Medicine

## 2022-09-30 DIAGNOSIS — Z1231 Encounter for screening mammogram for malignant neoplasm of breast: Secondary | ICD-10-CM

## 2022-10-17 IMAGING — DX DG KNEE 1-2V*R*
2 series · 2 of 2 positions shown · non-contrast
Comparison: None.

CLINICAL DATA: Postop, knee replacement

EXAM:
RIGHT KNEE - 1-2 VIEW

[knee ap]
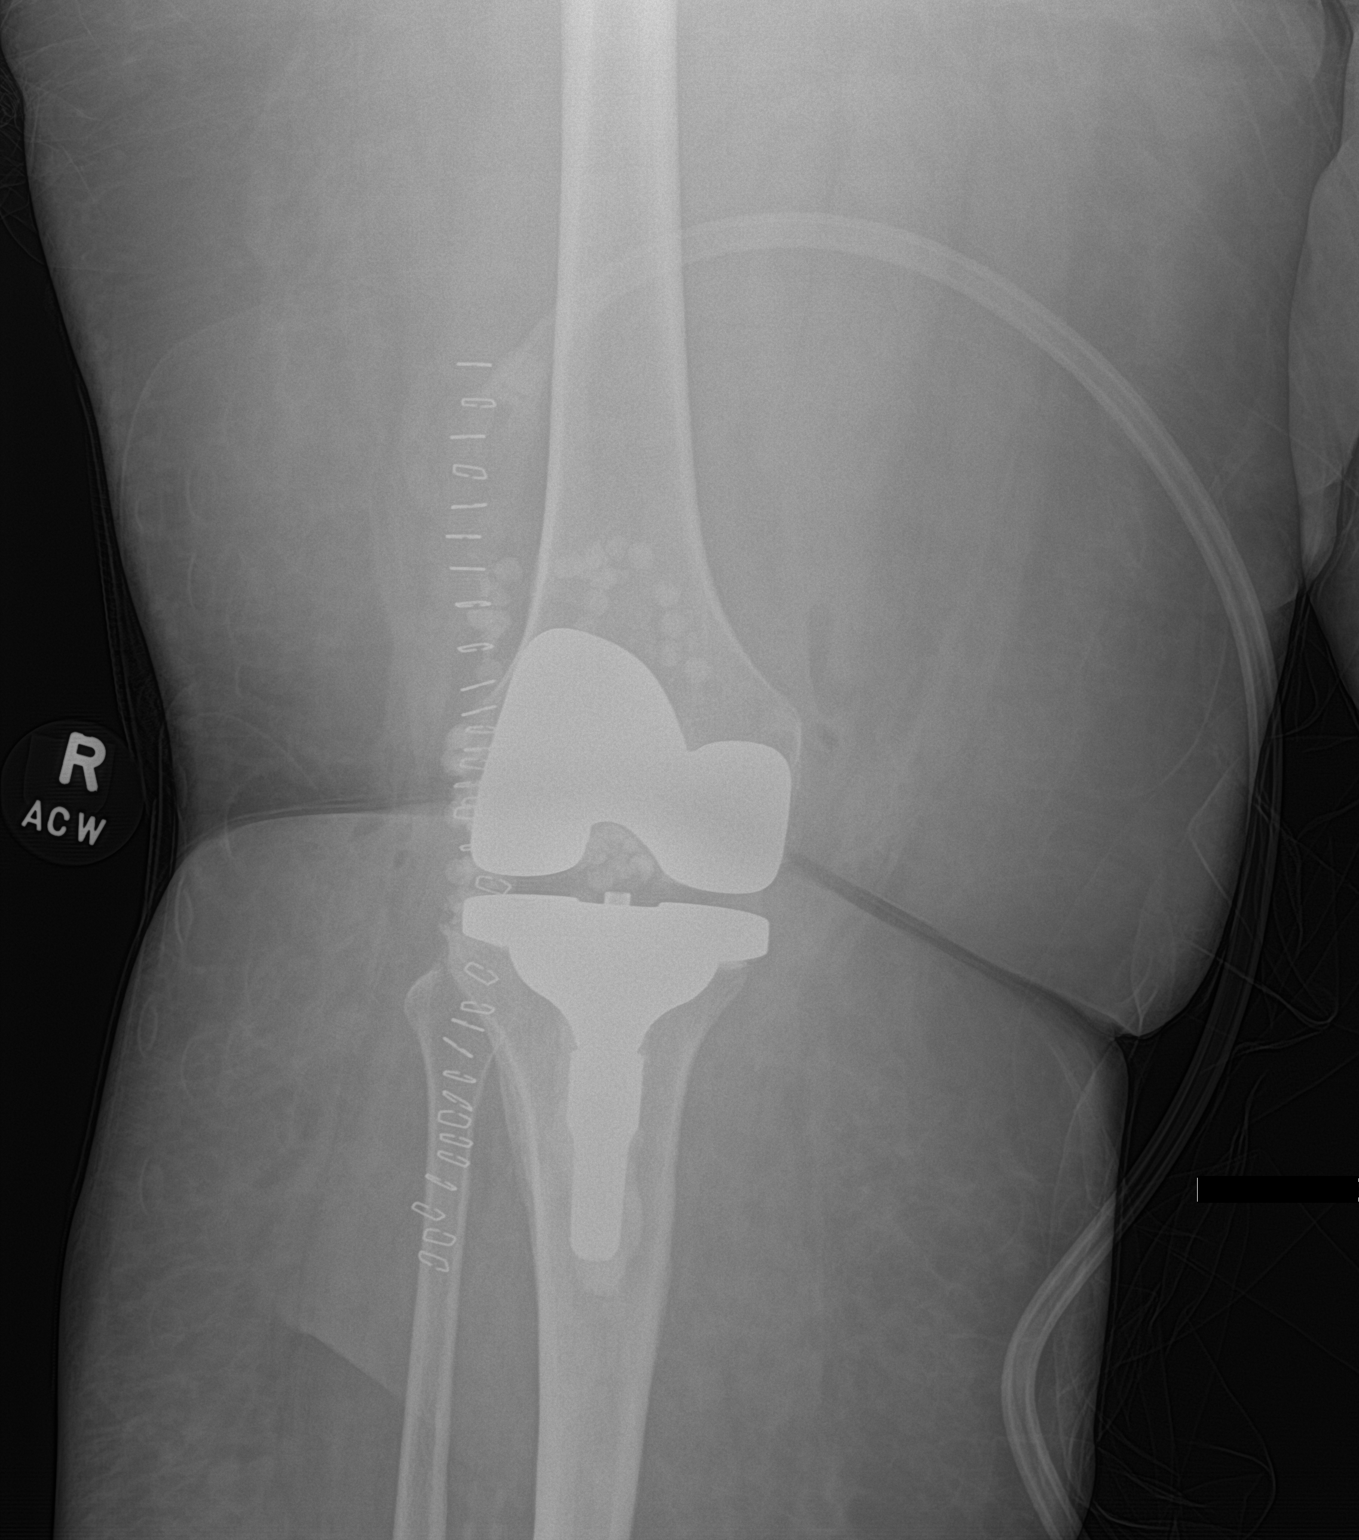

[knee lat]
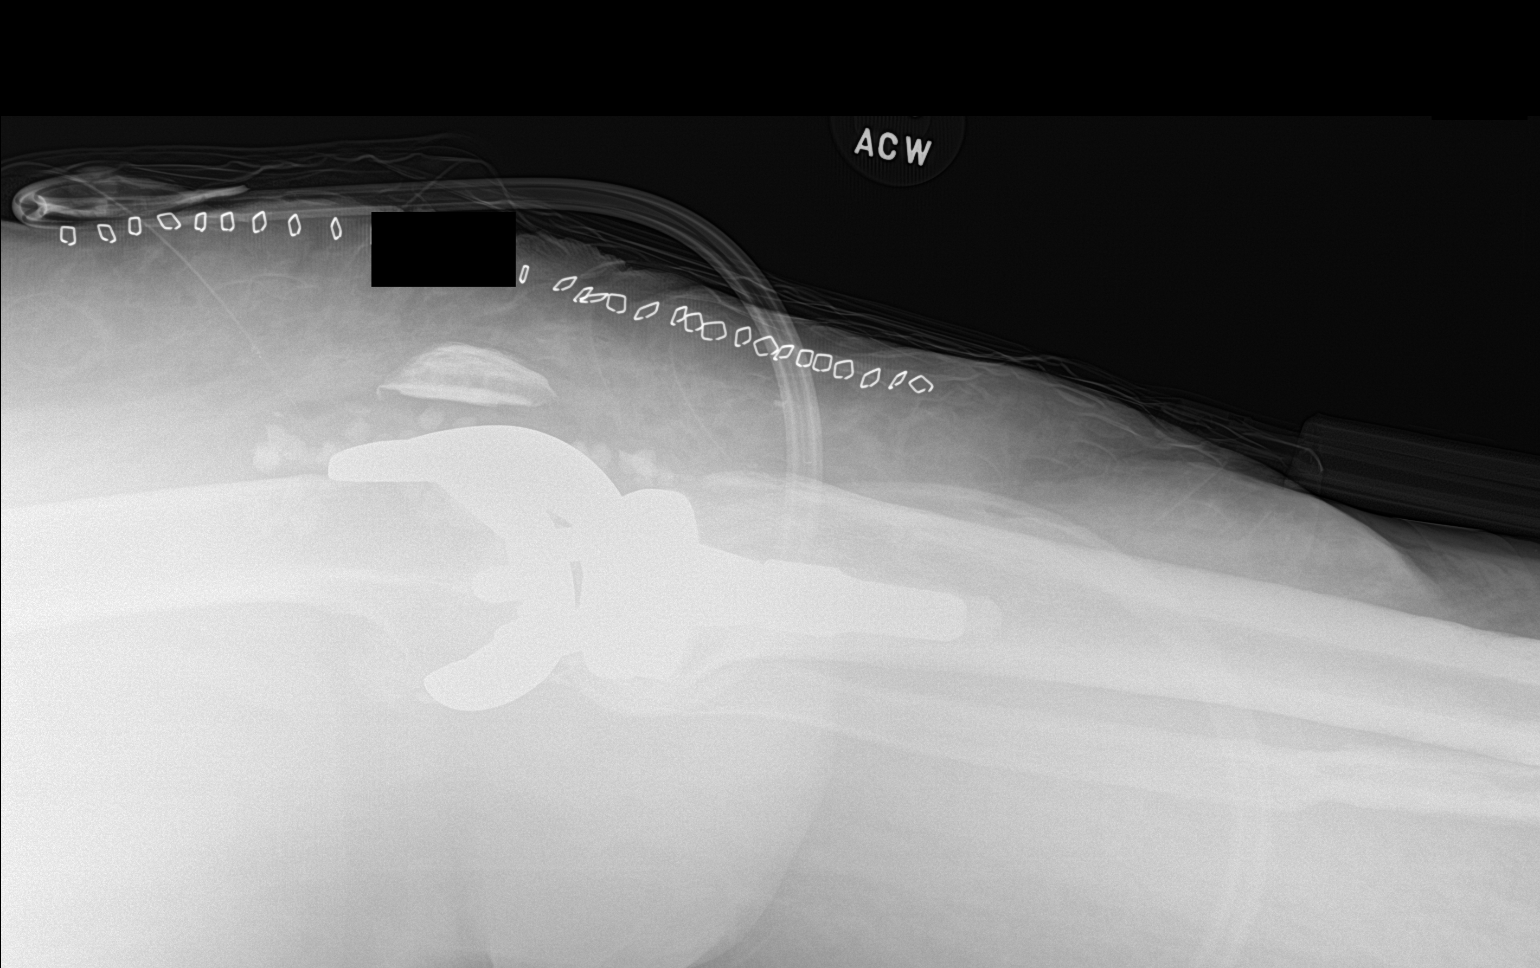

[2 of 2 positions shown; findings below may reference images not displayed]

FINDINGS: Status post right knee total arthroplasty with antibiotic packing
beads and expected overlying postoperative change. No evidence of
perihardware fracture or component malpositioning.
IMPRESSION: Status post right knee total arthroplasty with antibiotic packing
beads and expected overlying postoperative change. No evidence of
perihardware fracture or component malpositioning.

## 2022-10-29 DIAGNOSIS — E119 Type 2 diabetes mellitus without complications: Secondary | ICD-10-CM | POA: Diagnosis not present

## 2022-10-29 DIAGNOSIS — Z780 Asymptomatic menopausal state: Secondary | ICD-10-CM | POA: Diagnosis not present

## 2022-10-29 DIAGNOSIS — M19271 Secondary osteoarthritis, right ankle and foot: Secondary | ICD-10-CM | POA: Diagnosis not present

## 2022-11-07 ENCOUNTER — Ambulatory Visit
Admission: RE | Admit: 2022-11-07 | Discharge: 2022-11-07 | Disposition: A | Discharge status: Home / Self Care | Payer: PPO | Source: Ambulatory Visit | Attending: Internal Medicine | Admitting: Internal Medicine

## 2022-11-07 DIAGNOSIS — Z1231 Encounter for screening mammogram for malignant neoplasm of breast: Secondary | ICD-10-CM | POA: Insufficient documentation

## 2022-11-14 ENCOUNTER — Inpatient Hospital Stay
Admission: RE | Admit: 2022-11-14 | Discharge: 2022-11-14 | Disposition: A | Discharge status: Home / Self Care | Payer: Self-pay | Source: Ambulatory Visit | Attending: *Deleted | Admitting: *Deleted

## 2022-11-14 ENCOUNTER — Other Ambulatory Visit: Payer: Self-pay | Admitting: *Deleted

## 2022-11-14 DIAGNOSIS — Z1231 Encounter for screening mammogram for malignant neoplasm of breast: Secondary | ICD-10-CM

## 2022-12-03 DIAGNOSIS — L2089 Other atopic dermatitis: Secondary | ICD-10-CM | POA: Diagnosis not present

## 2022-12-03 DIAGNOSIS — B001 Herpesviral vesicular dermatitis: Secondary | ICD-10-CM | POA: Diagnosis not present

## 2022-12-18 ENCOUNTER — Other Ambulatory Visit: Payer: Self-pay

## 2022-12-18 ENCOUNTER — Telehealth: Payer: Self-pay

## 2022-12-18 DIAGNOSIS — M25561 Pain in right knee: Secondary | ICD-10-CM | POA: Diagnosis not present

## 2022-12-18 DIAGNOSIS — M25562 Pain in left knee: Secondary | ICD-10-CM | POA: Diagnosis not present

## 2022-12-18 DIAGNOSIS — L851 Acquired keratosis [keratoderma] palmaris et plantaris: Secondary | ICD-10-CM | POA: Diagnosis not present

## 2022-12-18 DIAGNOSIS — B351 Tinea unguium: Secondary | ICD-10-CM | POA: Diagnosis not present

## 2022-12-18 DIAGNOSIS — I890 Lymphedema, not elsewhere classified: Secondary | ICD-10-CM | POA: Diagnosis not present

## 2022-12-18 DIAGNOSIS — M069 Rheumatoid arthritis, unspecified: Secondary | ICD-10-CM | POA: Diagnosis not present

## 2022-12-18 DIAGNOSIS — E119 Type 2 diabetes mellitus without complications: Secondary | ICD-10-CM | POA: Diagnosis not present

## 2022-12-18 DIAGNOSIS — M778 Other enthesopathies, not elsewhere classified: Secondary | ICD-10-CM | POA: Diagnosis not present

## 2022-12-18 DIAGNOSIS — G8929 Other chronic pain: Secondary | ICD-10-CM | POA: Diagnosis not present

## 2022-12-18 DIAGNOSIS — N1831 Chronic kidney disease, stage 3a: Secondary | ICD-10-CM | POA: Diagnosis not present

## 2022-12-18 DIAGNOSIS — L600 Ingrowing nail: Secondary | ICD-10-CM | POA: Diagnosis not present

## 2022-12-18 DIAGNOSIS — Q663 Other congenital varus deformities of feet, unspecified foot: Secondary | ICD-10-CM | POA: Diagnosis not present

## 2022-12-18 NOTE — Telephone Encounter (Signed)
Faxed lab orders to dr. Wyatt Portela office to have the labs drawn Per Dr. Janese Banks.

## 2022-12-20 ENCOUNTER — Telehealth: Payer: Self-pay

## 2022-12-20 NOTE — Telephone Encounter (Signed)
-----   Message from Jorene Minors, RN sent at 12/18/2022  3:12 PM EST ----- Regarding: labs Done   ----- Message ----- From: Rochele Raring Sent: 12/18/2022   2:43 PM EST To: Luella Cook, RN; Jorene Minors, RN  Hey, patient wants to see if she can her labs drawn at Dr. Wyatt Portela on April 30th, 2024. She sees Janese Banks on May 7th.   Thank you! Let me know if I can help

## 2023-01-02 ENCOUNTER — Ambulatory Visit (INDEPENDENT_AMBULATORY_CARE_PROVIDER_SITE_OTHER): Payer: PPO | Admitting: Nurse Practitioner

## 2023-02-27 ENCOUNTER — Telehealth: Payer: Self-pay | Admitting: *Deleted

## 2023-02-27 ENCOUNTER — Encounter: Payer: Self-pay | Admitting: *Deleted

## 2023-02-27 NOTE — Telephone Encounter (Signed)
Pt had sent my chart several weeks ago to get the labs that we need for her and at PCP at [facility] I faxed the results 4/2 and got a transmission with the fax. I called today and wanted to see if they can add on our labs at cancer center and told me that they never got it.. I got a phone call with staff of PCP and they gave me another fax number and I faxed it [phone]. The fax went over also.  Staff said they would lookat it and let us Know if she can have labs there for cancer center.

## 2023-03-04 ENCOUNTER — Encounter: Payer: Self-pay | Admitting: *Deleted

## 2023-03-11 DIAGNOSIS — E611 Iron deficiency: Secondary | ICD-10-CM | POA: Diagnosis not present

## 2023-03-11 DIAGNOSIS — E119 Type 2 diabetes mellitus without complications: Secondary | ICD-10-CM | POA: Diagnosis not present

## 2023-03-17 ENCOUNTER — Other Ambulatory Visit: Payer: PPO

## 2023-03-17 ENCOUNTER — Ambulatory Visit: Payer: PPO | Admitting: Oncology

## 2023-03-18 ENCOUNTER — Other Ambulatory Visit: Payer: PPO

## 2023-03-18 ENCOUNTER — Inpatient Hospital Stay: Payer: PPO | Attending: Oncology | Admitting: Oncology

## 2023-03-18 ENCOUNTER — Encounter: Payer: Self-pay | Admitting: Oncology

## 2023-03-18 VITALS — BP 147/72 | HR 99 | Temp 96.3°F | Resp 18 | Ht 62.0 in | Wt 277.3 lb

## 2023-03-18 DIAGNOSIS — M069 Rheumatoid arthritis, unspecified: Secondary | ICD-10-CM | POA: Insufficient documentation

## 2023-03-18 DIAGNOSIS — I129 Hypertensive chronic kidney disease with stage 1 through stage 4 chronic kidney disease, or unspecified chronic kidney disease: Secondary | ICD-10-CM | POA: Insufficient documentation

## 2023-03-18 DIAGNOSIS — E119 Type 2 diabetes mellitus without complications: Secondary | ICD-10-CM | POA: Diagnosis not present

## 2023-03-18 DIAGNOSIS — N183 Chronic kidney disease, stage 3 unspecified: Secondary | ICD-10-CM | POA: Diagnosis not present

## 2023-03-18 DIAGNOSIS — N1831 Chronic kidney disease, stage 3a: Secondary | ICD-10-CM | POA: Diagnosis not present

## 2023-03-18 DIAGNOSIS — D631 Anemia in chronic kidney disease: Secondary | ICD-10-CM | POA: Insufficient documentation

## 2023-03-18 DIAGNOSIS — R7989 Other specified abnormal findings of blood chemistry: Secondary | ICD-10-CM | POA: Diagnosis not present

## 2023-03-18 DIAGNOSIS — I890 Lymphedema, not elsewhere classified: Secondary | ICD-10-CM | POA: Diagnosis not present

## 2023-03-18 NOTE — Progress Notes (Signed)
Hematology/Oncology Consult note [facility]  Telephone:([phone] Fax:([phone]  Patient Care Team: Enid Baas, MD as PCP - General (Internal Medicine) Creig Hines, MD as Consulting Physician (Oncology)   Name of the patient: Autumn Burton  [id number]  [birth year]   Date of visit: 03/18/23  Diagnosis- Normocytic anemia likely secondary to anemia of chronic kidney disease/anemia of chronic disease    Chief complaint/ Reason for visit-routine follow-up of anemia  Heme/Onc history: patient is a 67 year old female with a past medical history significant for rheumatoid arthritis, obesity hypertension among other medical problems.  She is a resident at [facility] facility.  She has been referred to [place] for anemia. Patient was recently discharged from orthopedic service after total left knee replacement.  Patient's baseline hemoglobin runs around 10-11 since March 2021.  Prior to that her hemoglobin was closer to 12.  However since her knee replacement surgery her hemoglobin dropped from 10-7.1 gradually and had to be given a unit of blood transfusion.  White count and platelets normal.  MCV 97.7.  Patient denies any blood loss in her stool or.  Denies any dark melanotic stools.  Energy levels have been overall stable.  She is able to walk with the help of a walker around the facility.  She has some component of chronic kidney disease and her creatinine typically fluctuates between 1.1- 1.4.   Results of blood work from 06/18/2021 were as follows: CBC showed white count of 9, H&H of 8.9/28 with a platelet count of 309.  Serum creatinine mildly elevated at 1.29.  Folate levels normal.  B12 levels low at 239.  Ferritin levels elevated at 376 and iron saturation 19% with a normal TIBC.  Reticulocyte count normal for the degree of anemia at 3.6.  Myeloma panel showed no M protein.  Haptoglobin mildly elevated at 372.  TSH normal.  Interval history-patient is  doing well presently.  Denies any specific complaints at this time. ECOG PS- 0 Pain scale- 0   Review of systems- Review of Systems  Constitutional:  Negative for chills, fever, malaise/fatigue and weight loss.  HENT:  Negative for congestion, ear discharge and nosebleeds.   Eyes:  Negative for blurred vision.  Respiratory:  Negative for cough, hemoptysis, sputum production, shortness of breath and wheezing.   Cardiovascular:  Negative for chest pain, palpitations, orthopnea and claudication.  Gastrointestinal:  Negative for abdominal pain, blood in stool, constipation, diarrhea, heartburn, melena, nausea and vomiting.  Genitourinary:  Negative for dysuria, flank pain, frequency, hematuria and urgency.  Musculoskeletal:  Negative for back pain, joint pain and myalgias.  Skin:  Negative for rash.  Neurological:  Negative for dizziness, tingling, focal weakness, seizures, weakness and headaches.  Endo/Heme/Allergies:  Does not bruise/bleed easily.  Psychiatric/Behavioral:  Negative for depression and suicidal ideas. The patient does not have insomnia.       Allergies  Allergen Reactions   Latex Hives, Itching and Rash    Redness     Past Medical History:  Diagnosis Date   Anemia    Arthritis    Chronic kidney disease    stage 3   Diabetes mellitus without complication (HCC)    Eczema    GERD (gastroesophageal reflux disease)    Gout    Headache    Hypertension    Lymphedema of both lower extremities    being followed by Dr Sabino Gasser [facility]s bil   Morbid obesity with BMI of 45.0-49.9, adult (HCC)  Venous ulcer of right leg (HCC)    thigh-being followed by dr dew     Past Surgical History:  Procedure Laterality Date   COLONOSCOPY WITH PROPOFOL N/A 10/28/2016   Procedure: COLONOSCOPY WITH PROPOFOL;  Surgeon: Wyline Mood, MD;  Location: [facility] ENDOSCOPY;  Service: Endoscopy;  Laterality: N/A;   COLONOSCOPY WITH PROPOFOL N/A 03/18/2022   Procedure: COLONOSCOPY WITH PROPOFOL;   Surgeon: Wyline Mood, MD;  Location: [facility] ENDOSCOPY;  Service: Gastroenterology;  Laterality: N/A;   DG RIGHT TIBIA AND FIBULA ([facility] HX)     LEG SURGERY Right    skin grafting to right thigh     TOTAL KNEE ARTHROPLASTY Left 06/05/2021   Procedure: TOTAL KNEE ARTHROPLASTY;  Surgeon: Kennedy Bucker, MD;  Location: [facility] ORS;  Service: Orthopedics;  Laterality: Left;   TOTAL KNEE ARTHROPLASTY Right 10/30/2021   Procedure: TOTAL KNEE ARTHROPLASTY;  Surgeon: Kennedy Bucker, MD;  Location: [facility] ORS;  Service: Orthopedics;  Laterality: Right;    Social History   Socioeconomic History   Marital status: Widowed    Spouse name: Not on file   Number of children: 1   Years of education: Not on file   Highest education level: Not on file  Occupational History   Not on file  Tobacco Use   Smoking status: Never   Smokeless tobacco: Never  Vaping Use   Vaping Use: Never used  Substance and Sexual Activity   Alcohol use: No    Alcohol/week: 0.0 standard drinks of alcohol   Drug use: No   Sexual activity: Not on file  Other Topics Concern   Not on file  Social History Narrative   Live alone.  Sisters, son live close by.   Social Determinants of Health   Financial Resource Strain: Not on file  Food Insecurity: Not on file  Transportation Needs: Not on file  Physical Activity: Not on file  Stress: Not on file  Social Connections: Not on file  Intimate Partner Violence: Not on file    Family History  Problem Relation Age of Onset   Diabetes Mother    COPD Mother    Heart disease Mother    Healthy Sister    Healthy Son      Current Outpatient Medications:    aspirin EC 81 MG tablet, Take 81 mg by mouth daily. Swallow whole., Disp: , Rfl:    calcium carbonate (TUMS - DOSED IN MG ELEMENTAL CALCIUM) 500 MG chewable tablet, Chew 1 tablet by mouth daily as needed for indigestion or heartburn., Disp: , Rfl:    cholecalciferol (VITAMIN D3) 25 MCG (1000 UNIT) tablet, Take 1,000 Units by  mouth daily., Disp: , Rfl:    docusate sodium (COLACE) 100 MG capsule, Take 1 capsule (100 mg total) by mouth 2 (two) times daily., Disp: 10 capsule, Rfl: 0   ferrous sulfate 325 (65 FE) MG tablet, Take 325 mg by mouth daily with breakfast., Disp: , Rfl:    folic acid (FOLVITE) 1 MG tablet, Take 1 mg by mouth See admin instructions. Take 1 mg by mouth daily except for Sunday, Disp: , Rfl:    furosemide (LASIX) 20 MG tablet, Take 1 tablet (20 mg total) by mouth daily., Disp: 90 tablet, Rfl: 1   gabapentin (NEURONTIN) 300 MG capsule, Take 300 mg by mouth 3 (three) times daily., Disp: , Rfl:    halobetasol (ULTRAVATE) 0.05 % ointment, Apply 1 application topically 2 (two) times daily as needed (irritation). (Patient not taking: Reported on 12/11/2021), Disp: ,  Rfl:    hydrochlorothiazide (HYDRODIURIL) 25 MG tablet, Take 1 tablet (25 mg total) by mouth daily., Disp: 90 tablet, Rfl: 1   HYDROcodone-acetaminophen (NORCO) 7.5-325 MG tablet, Take 1-2 tablets by mouth every 4 (four) hours as needed for severe pain (pain score 7-10)., Disp: 30 tablet, Rfl: 0   methocarbamol (ROBAXIN) 500 MG tablet, Take 1 tablet (500 mg total) by mouth every 6 (six) hours as needed for muscle spasms., Disp: , Rfl:    methotrexate 2.5 MG tablet, Take 15 mg by mouth every Sunday., Disp: , Rfl:    metoprolol tartrate (LOPRESSOR) 25 MG tablet, Take 1 tablet (25 mg total) by mouth daily. (Patient taking differently: Take 25 mg by mouth every morning.), Disp: 90 tablet, Rfl: 1   olmesartan (BENICAR) 20 MG tablet, Take 1 tablet (20 mg total) by mouth daily. (Patient taking differently: Take 20 mg by mouth every morning.), Disp: 90 tablet, Rfl: 1   polyethylene glycol (MIRALAX / GLYCOLAX) 17 g packet, Take 17 g by mouth daily as needed for mild constipation., Disp: 14 each, Rfl: 0   potassium chloride (KLOR-CON) 10 MEQ tablet, TAKE 1 TABLET BY MOUTH ONCE DAILY. TAKE WITH FUROSEMIDE. (Patient taking differently: Take 10 mEq by mouth  daily.), Disp: 90 tablet, Rfl: 1   traMADol (ULTRAM) 50 MG tablet, Take 1 tablet (50 mg total) by mouth every 6 (six) hours as needed., Disp: 30 tablet, Rfl: 0   triamcinolone ointment (KENALOG) 0.1 %, Apply 1 application topically daily as needed (eczema flares)., Disp: , Rfl:    vitamin B-12 (CYANOCOBALAMIN) 1000 MCG tablet, Take 1,000 mcg by mouth daily., Disp: , Rfl:   Physical exam: There were no vitals filed for this visit. Physical Exam Cardiovascular:     Rate and Rhythm: Normal rate and regular rhythm.     Heart sounds: Normal heart sounds.  Pulmonary:     Effort: Pulmonary effort is normal.     Breath sounds: Normal breath sounds.  Skin:    General: Skin is warm and dry.  Neurological:     Mental Status: She is alert and oriented to person, place, and time.         Latest Ref Rng & Units 03/12/2022   12:26 PM  CMP  Glucose 70 - 99 mg/dL 528   BUN 8 - 23 mg/dL 30   Creatinine 4.13 - 1.00 mg/dL 2.44   Sodium 010 - 272 mmol/L 133   Potassium 3.5 - 5.1 mmol/L 4.2   Chloride 98 - 111 mmol/L 100   CO2 22 - 32 mmol/L 26   Calcium 8.9 - 10.3 mg/dL 9.3   Total Protein 6.5 - 8.1 g/dL 8.2   Total Bilirubin 0.3 - 1.2 mg/dL 0.6   Alkaline Phos 38 - 126 U/L 80   AST 15 - 41 U/L 17   ALT 0 - 44 U/L 12       Latest Ref Rng & Units 03/12/2022   12:26 PM  CBC  WBC 4.0 - 10.5 K/uL 4.8   Hemoglobin 12.0 - 15.0 g/dL 53.6   Hematocrit 64.4 - 46.0 % 35.1   Platelets 150 - 400 K/uL 282      Assessment and plan- Patient is a 67 y.o. female here for routine follow-up of anemia  Patient's hemoglobin has remained stable between 11-12.  Her most recent hemoglobin was 12.7 on 03/11/2023.  Ferritin normal at 123.  Iron studies showed an iron saturation of 23%.  She does not require any IV  iron at this time.  Given stability of her labs I will check CBC ferritin and iron studies and CMP in 6 months in 1 year and I will see her back in 1 year   Visit Diagnosis 1. Anemia of chronic kidney  failure, stage 3 (moderate) (HCC)      Dr. Owens Shark, MD, MPH [facility] at [facility] [phone] 03/18/2023 12:54 PM

## 2023-05-20 DIAGNOSIS — E785 Hyperlipidemia, unspecified: Secondary | ICD-10-CM | POA: Diagnosis not present

## 2023-05-20 DIAGNOSIS — N1832 Chronic kidney disease, stage 3b: Secondary | ICD-10-CM | POA: Diagnosis not present

## 2023-05-20 DIAGNOSIS — E668 Other obesity: Secondary | ICD-10-CM | POA: Diagnosis not present

## 2023-05-20 DIAGNOSIS — E1122 Type 2 diabetes mellitus with diabetic chronic kidney disease: Secondary | ICD-10-CM | POA: Diagnosis not present

## 2023-05-20 DIAGNOSIS — I10 Essential (primary) hypertension: Secondary | ICD-10-CM | POA: Diagnosis not present

## 2023-05-26 ENCOUNTER — Other Ambulatory Visit: Payer: Self-pay | Admitting: Nephrology

## 2023-05-26 DIAGNOSIS — N1832 Chronic kidney disease, stage 3b: Secondary | ICD-10-CM

## 2023-05-26 DIAGNOSIS — E1122 Type 2 diabetes mellitus with diabetic chronic kidney disease: Secondary | ICD-10-CM

## 2023-05-29 ENCOUNTER — Ambulatory Visit
Admission: RE | Admit: 2023-05-29 | Discharge: 2023-05-29 | Disposition: A | Discharge status: Home / Self Care | Payer: PPO | Source: Ambulatory Visit | Attending: Nephrology | Admitting: Nephrology

## 2023-05-29 DIAGNOSIS — N281 Cyst of kidney, acquired: Secondary | ICD-10-CM | POA: Diagnosis not present

## 2023-05-29 DIAGNOSIS — E1122 Type 2 diabetes mellitus with diabetic chronic kidney disease: Secondary | ICD-10-CM | POA: Diagnosis not present

## 2023-05-29 DIAGNOSIS — N1832 Chronic kidney disease, stage 3b: Secondary | ICD-10-CM | POA: Insufficient documentation

## 2023-06-17 DIAGNOSIS — E119 Type 2 diabetes mellitus without complications: Secondary | ICD-10-CM | POA: Diagnosis not present

## 2023-06-24 DIAGNOSIS — M069 Rheumatoid arthritis, unspecified: Secondary | ICD-10-CM | POA: Diagnosis not present

## 2023-06-24 DIAGNOSIS — N1831 Chronic kidney disease, stage 3a: Secondary | ICD-10-CM | POA: Diagnosis not present

## 2023-06-24 DIAGNOSIS — N183 Chronic kidney disease, stage 3 unspecified: Secondary | ICD-10-CM | POA: Diagnosis not present

## 2023-06-24 DIAGNOSIS — E1122 Type 2 diabetes mellitus with diabetic chronic kidney disease: Secondary | ICD-10-CM | POA: Diagnosis not present

## 2023-06-24 DIAGNOSIS — I890 Lymphedema, not elsewhere classified: Secondary | ICD-10-CM | POA: Diagnosis not present

## 2023-06-24 DIAGNOSIS — M25562 Pain in left knee: Secondary | ICD-10-CM | POA: Diagnosis not present

## 2023-06-24 DIAGNOSIS — G8929 Other chronic pain: Secondary | ICD-10-CM | POA: Diagnosis not present

## 2023-06-24 DIAGNOSIS — M25561 Pain in right knee: Secondary | ICD-10-CM | POA: Diagnosis not present

## 2023-06-24 DIAGNOSIS — E119 Type 2 diabetes mellitus without complications: Secondary | ICD-10-CM | POA: Diagnosis not present

## 2023-06-24 DIAGNOSIS — E611 Iron deficiency: Secondary | ICD-10-CM | POA: Diagnosis not present

## 2023-08-05 DIAGNOSIS — E1122 Type 2 diabetes mellitus with diabetic chronic kidney disease: Secondary | ICD-10-CM | POA: Diagnosis not present

## 2023-08-05 DIAGNOSIS — E785 Hyperlipidemia, unspecified: Secondary | ICD-10-CM | POA: Diagnosis not present

## 2023-08-05 DIAGNOSIS — N1832 Chronic kidney disease, stage 3b: Secondary | ICD-10-CM | POA: Diagnosis not present

## 2023-08-05 DIAGNOSIS — E668 Other obesity: Secondary | ICD-10-CM | POA: Diagnosis not present

## 2023-08-05 DIAGNOSIS — I10 Essential (primary) hypertension: Secondary | ICD-10-CM | POA: Diagnosis not present

## 2023-09-18 ENCOUNTER — Inpatient Hospital Stay: Payer: PPO

## 2023-09-23 ENCOUNTER — Inpatient Hospital Stay: Payer: PPO | Attending: Oncology

## 2023-09-23 DIAGNOSIS — D631 Anemia in chronic kidney disease: Secondary | ICD-10-CM | POA: Diagnosis not present

## 2023-09-23 DIAGNOSIS — N183 Chronic kidney disease, stage 3 unspecified: Secondary | ICD-10-CM | POA: Insufficient documentation

## 2023-09-23 LAB — CBC WITH DIFFERENTIAL (CANCER CENTER ONLY)
Abs Immature Granulocytes: 0.01 10*3/uL (ref 0.00–0.07)
Basophils Absolute: 0 10*3/uL (ref 0.0–0.1)
Basophils Relative: 1 %
Eosinophils Absolute: 0.2 10*3/uL (ref 0.0–0.5)
Eosinophils Relative: 3 %
HCT: 44.8 % (ref 36.0–46.0)
Hemoglobin: 14.1 g/dL (ref 12.0–15.0)
Immature Granulocytes: 0 %
Lymphocytes Relative: 37 %
Lymphs Abs: 2.1 10*3/uL (ref 0.7–4.0)
MCH: 29 pg (ref 26.0–34.0)
MCHC: 31.5 g/dL (ref 30.0–36.0)
MCV: 92 fL (ref 80.0–100.0)
Monocytes Absolute: 0.4 10*3/uL (ref 0.1–1.0)
Monocytes Relative: 7 %
Neutro Abs: 3 10*3/uL (ref 1.7–7.7)
Neutrophils Relative %: 52 %
Platelet Count: 256 10*3/uL (ref 150–400)
RBC: 4.87 MIL/uL (ref 3.87–5.11)
RDW: 14.2 % (ref 11.5–15.5)
WBC Count: 5.8 10*3/uL (ref 4.0–10.5)
nRBC: 0 % (ref 0.0–0.2)

## 2023-09-23 LAB — CMP (CANCER CENTER ONLY)
ALT: 13 U/L (ref 0–44)
AST: 17 U/L (ref 15–41)
Albumin: 3.7 g/dL (ref 3.5–5.0)
Alkaline Phosphatase: 85 U/L (ref 38–126)
Anion gap: 10 (ref 5–15)
BUN: 26 mg/dL — ABNORMAL HIGH (ref 8–23)
CO2: 27 mmol/L (ref 22–32)
Calcium: 9 mg/dL (ref 8.9–10.3)
Chloride: 99 mmol/L (ref 98–111)
Creatinine: 1.47 mg/dL — ABNORMAL HIGH (ref 0.44–1.00)
GFR, Estimated: 39 mL/min — ABNORMAL LOW (ref >60)
Glucose, Bld: 100 mg/dL — ABNORMAL HIGH (ref 70–99)
Potassium: 4 mmol/L (ref 3.5–5.1)
Sodium: 136 mmol/L (ref 135–145)
Total Bilirubin: 0.3 mg/dL (ref <1.2)
Total Protein: 7.7 g/dL (ref 6.5–8.1)

## 2023-09-23 LAB — IRON AND TIBC
Iron: 66 ug/dL (ref 28–170)
Saturation Ratios: 19 % (ref 10.4–31.8)
TIBC: 349 ug/dL (ref 250–450)
UIBC: 283 ug/dL

## 2023-09-23 LAB — FERRITIN: Ferritin: 131 ng/mL (ref 11–307)

## 2023-11-14 ENCOUNTER — Other Ambulatory Visit: Payer: Self-pay | Admitting: Internal Medicine

## 2023-11-14 DIAGNOSIS — E1122 Type 2 diabetes mellitus with diabetic chronic kidney disease: Secondary | ICD-10-CM | POA: Diagnosis not present

## 2023-11-14 DIAGNOSIS — Z23 Encounter for immunization: Secondary | ICD-10-CM | POA: Diagnosis not present

## 2023-11-14 DIAGNOSIS — E559 Vitamin D deficiency, unspecified: Secondary | ICD-10-CM | POA: Diagnosis not present

## 2023-11-14 DIAGNOSIS — M069 Rheumatoid arthritis, unspecified: Secondary | ICD-10-CM | POA: Diagnosis not present

## 2023-11-14 DIAGNOSIS — Z1231 Encounter for screening mammogram for malignant neoplasm of breast: Secondary | ICD-10-CM

## 2023-11-14 DIAGNOSIS — N183 Chronic kidney disease, stage 3 unspecified: Secondary | ICD-10-CM | POA: Diagnosis not present

## 2023-11-14 DIAGNOSIS — Z0000 Encounter for general adult medical examination without abnormal findings: Secondary | ICD-10-CM | POA: Diagnosis not present

## 2023-12-11 ENCOUNTER — Other Ambulatory Visit
Admission: RE | Admit: 2023-12-11 | Discharge: 2023-12-11 | Disposition: A | Discharge status: Home / Self Care | Payer: PPO | Attending: Nephrology | Admitting: Nephrology

## 2023-12-11 DIAGNOSIS — N1832 Chronic kidney disease, stage 3b: Secondary | ICD-10-CM | POA: Diagnosis not present

## 2023-12-11 DIAGNOSIS — I129 Hypertensive chronic kidney disease with stage 1 through stage 4 chronic kidney disease, or unspecified chronic kidney disease: Secondary | ICD-10-CM | POA: Insufficient documentation

## 2023-12-11 DIAGNOSIS — E785 Hyperlipidemia, unspecified: Secondary | ICD-10-CM | POA: Insufficient documentation

## 2023-12-11 DIAGNOSIS — E1122 Type 2 diabetes mellitus with diabetic chronic kidney disease: Secondary | ICD-10-CM | POA: Insufficient documentation

## 2023-12-11 LAB — CBC WITH DIFFERENTIAL/PLATELET
Abs Immature Granulocytes: 0.01 10*3/uL (ref 0.00–0.07)
Basophils Absolute: 0 10*3/uL (ref 0.0–0.1)
Basophils Relative: 1 %
Eosinophils Absolute: 0.2 10*3/uL (ref 0.0–0.5)
Eosinophils Relative: 3 %
HCT: 43.2 % (ref 36.0–46.0)
Hemoglobin: 13.6 g/dL (ref 12.0–15.0)
Immature Granulocytes: 0 %
Lymphocytes Relative: 37 %
Lymphs Abs: 2 10*3/uL (ref 0.7–4.0)
MCH: 28.8 pg (ref 26.0–34.0)
MCHC: 31.5 g/dL (ref 30.0–36.0)
MCV: 91.5 fL (ref 80.0–100.0)
Monocytes Absolute: 0.4 10*3/uL (ref 0.1–1.0)
Monocytes Relative: 8 %
Neutro Abs: 2.7 10*3/uL (ref 1.7–7.7)
Neutrophils Relative %: 51 %
Platelets: 242 10*3/uL (ref 150–400)
RBC: 4.72 MIL/uL (ref 3.87–5.11)
RDW: 15 % (ref 11.5–15.5)
WBC: 5.3 10*3/uL (ref 4.0–10.5)
nRBC: 0 % (ref 0.0–0.2)

## 2023-12-11 LAB — URINALYSIS, COMPLETE (UACMP) WITH MICROSCOPIC
Bilirubin Urine: NEGATIVE
Glucose, UA: 50 mg/dL — AB
Hgb urine dipstick: NEGATIVE
Ketones, ur: NEGATIVE mg/dL
Leukocytes,Ua: NEGATIVE
Nitrite: NEGATIVE
Protein, ur: NEGATIVE mg/dL
Specific Gravity, Urine: 1.009 (ref 1.005–1.030)
pH: 5 (ref 5.0–8.0)

## 2023-12-11 LAB — RENAL FUNCTION PANEL
Albumin: 3.7 g/dL (ref 3.5–5.0)
Anion gap: 12 (ref 5–15)
BUN: 38 mg/dL — ABNORMAL HIGH (ref 8–23)
CO2: 26 mmol/L (ref 22–32)
Calcium: 9.3 mg/dL (ref 8.9–10.3)
Chloride: 98 mmol/L (ref 98–111)
Creatinine, Ser: 1.24 mg/dL — ABNORMAL HIGH (ref 0.44–1.00)
GFR, Estimated: 47 mL/min — ABNORMAL LOW (ref >60)
Glucose, Bld: 100 mg/dL — ABNORMAL HIGH (ref 70–99)
Phosphorus: 3.3 mg/dL (ref 2.5–4.6)
Potassium: 4.4 mmol/L (ref 3.5–5.1)
Sodium: 136 mmol/L (ref 135–145)

## 2023-12-11 LAB — PROTEIN / CREATININE RATIO, URINE
Creatinine, Urine: 48 mg/dL
Total Protein, Urine: <6 mg/dL

## 2023-12-11 LAB — HEMOGLOBIN A1C
Hgb A1c MFr Bld: 6.5 % — ABNORMAL HIGH (ref 4.8–5.6)
Mean Plasma Glucose: 139.85 mg/dL

## 2023-12-11 LAB — URIC ACID: Uric Acid, Serum: 8.7 mg/dL — ABNORMAL HIGH (ref 2.5–7.1)

## 2023-12-12 LAB — PARATHYROID HORMONE, INTACT (NO CA): PTH: 23 pg/mL (ref 15–65)

## 2023-12-16 ENCOUNTER — Ambulatory Visit
Admission: RE | Admit: 2023-12-16 | Discharge: 2023-12-16 | Disposition: A | Discharge status: Home / Self Care | Payer: PPO | Source: Ambulatory Visit | Attending: Internal Medicine | Admitting: Internal Medicine

## 2023-12-16 DIAGNOSIS — Z1231 Encounter for screening mammogram for malignant neoplasm of breast: Secondary | ICD-10-CM | POA: Insufficient documentation

## 2023-12-16 DIAGNOSIS — N1832 Chronic kidney disease, stage 3b: Secondary | ICD-10-CM | POA: Diagnosis not present

## 2023-12-16 DIAGNOSIS — E785 Hyperlipidemia, unspecified: Secondary | ICD-10-CM | POA: Diagnosis not present

## 2023-12-16 DIAGNOSIS — I10 Essential (primary) hypertension: Secondary | ICD-10-CM | POA: Diagnosis not present

## 2023-12-16 DIAGNOSIS — E1122 Type 2 diabetes mellitus with diabetic chronic kidney disease: Secondary | ICD-10-CM | POA: Diagnosis not present

## 2023-12-16 DIAGNOSIS — E669 Obesity, unspecified: Secondary | ICD-10-CM | POA: Diagnosis not present

## 2023-12-24 DIAGNOSIS — H2513 Age-related nuclear cataract, bilateral: Secondary | ICD-10-CM | POA: Diagnosis not present

## 2023-12-24 DIAGNOSIS — H40003 Preglaucoma, unspecified, bilateral: Secondary | ICD-10-CM | POA: Diagnosis not present

## 2023-12-24 DIAGNOSIS — E119 Type 2 diabetes mellitus without complications: Secondary | ICD-10-CM | POA: Diagnosis not present

## 2024-01-27 DIAGNOSIS — L859 Epidermal thickening, unspecified: Secondary | ICD-10-CM | POA: Diagnosis not present

## 2024-01-27 DIAGNOSIS — L2089 Other atopic dermatitis: Secondary | ICD-10-CM | POA: Diagnosis not present

## 2024-03-15 DIAGNOSIS — L84 Corns and callosities: Secondary | ICD-10-CM | POA: Diagnosis not present

## 2024-03-15 DIAGNOSIS — I890 Lymphedema, not elsewhere classified: Secondary | ICD-10-CM | POA: Diagnosis not present

## 2024-03-15 DIAGNOSIS — E1122 Type 2 diabetes mellitus with diabetic chronic kidney disease: Secondary | ICD-10-CM | POA: Diagnosis not present

## 2024-03-15 DIAGNOSIS — M069 Rheumatoid arthritis, unspecified: Secondary | ICD-10-CM | POA: Diagnosis not present

## 2024-03-15 DIAGNOSIS — N183 Chronic kidney disease, stage 3 unspecified: Secondary | ICD-10-CM | POA: Diagnosis not present

## 2024-03-15 DIAGNOSIS — N1832 Chronic kidney disease, stage 3b: Secondary | ICD-10-CM | POA: Diagnosis not present

## 2024-03-15 DIAGNOSIS — N1831 Chronic kidney disease, stage 3a: Secondary | ICD-10-CM | POA: Diagnosis not present

## 2024-03-15 DIAGNOSIS — E559 Vitamin D deficiency, unspecified: Secondary | ICD-10-CM | POA: Diagnosis not present

## 2024-03-16 ENCOUNTER — Other Ambulatory Visit: Payer: Self-pay | Admitting: *Deleted

## 2024-03-16 DIAGNOSIS — N183 Chronic kidney disease, stage 3 unspecified: Secondary | ICD-10-CM

## 2024-03-17 ENCOUNTER — Inpatient Hospital Stay: Payer: PPO | Admitting: Oncology

## 2024-03-17 ENCOUNTER — Inpatient Hospital Stay: Payer: PPO | Attending: Oncology

## 2024-03-17 ENCOUNTER — Encounter: Payer: Self-pay | Admitting: Oncology

## 2024-03-17 VITALS — BP 159/93 | HR 81 | Temp 97.6°F | Resp 16 | Wt 290.0 lb

## 2024-03-17 DIAGNOSIS — D509 Iron deficiency anemia, unspecified: Secondary | ICD-10-CM

## 2024-03-17 DIAGNOSIS — N183 Chronic kidney disease, stage 3 unspecified: Secondary | ICD-10-CM

## 2024-03-17 LAB — CBC WITH DIFFERENTIAL/PLATELET
Abs Immature Granulocytes: 0.04 10*3/uL (ref 0.00–0.07)
Basophils Absolute: 0 10*3/uL (ref 0.0–0.1)
Basophils Relative: 0 %
Eosinophils Absolute: 0.2 10*3/uL (ref 0.0–0.5)
Eosinophils Relative: 2 %
HCT: 38.1 % (ref 36.0–46.0)
Hemoglobin: 12.3 g/dL (ref 12.0–15.0)
Immature Granulocytes: 1 %
Lymphocytes Relative: 29 %
Lymphs Abs: 1.9 10*3/uL (ref 0.7–4.0)
MCH: 29.4 pg (ref 26.0–34.0)
MCHC: 32.3 g/dL (ref 30.0–36.0)
MCV: 91.1 fL (ref 80.0–100.0)
Monocytes Absolute: 0.6 10*3/uL (ref 0.1–1.0)
Monocytes Relative: 9 %
Neutro Abs: 4 10*3/uL (ref 1.7–7.7)
Neutrophils Relative %: 59 %
Platelets: 246 10*3/uL (ref 150–400)
RBC: 4.18 MIL/uL (ref 3.87–5.11)
RDW: 14.5 % (ref 11.5–15.5)
WBC: 6.8 10*3/uL (ref 4.0–10.5)
nRBC: 0 % (ref 0.0–0.2)

## 2024-03-17 LAB — CMP (CANCER CENTER ONLY)
ALT: 13 U/L (ref 0–44)
AST: 18 U/L (ref 15–41)
Albumin: 3.6 g/dL (ref 3.5–5.0)
Alkaline Phosphatase: 77 U/L (ref 38–126)
Anion gap: 14 (ref 5–15)
BUN: 34 mg/dL — ABNORMAL HIGH (ref 8–23)
CO2: 22 mmol/L (ref 22–32)
Calcium: 8.9 mg/dL (ref 8.9–10.3)
Chloride: 100 mmol/L (ref 98–111)
Creatinine: 1.54 mg/dL — ABNORMAL HIGH (ref 0.44–1.00)
GFR, Estimated: 37 mL/min — ABNORMAL LOW (ref >60)
Glucose, Bld: 110 mg/dL — ABNORMAL HIGH (ref 70–99)
Potassium: 3.8 mmol/L (ref 3.5–5.1)
Sodium: 136 mmol/L (ref 135–145)
Total Bilirubin: 0.3 mg/dL (ref 0.0–1.2)
Total Protein: 7.9 g/dL (ref 6.5–8.1)

## 2024-03-17 LAB — IRON AND TIBC
Iron: 43 ug/dL (ref 28–170)
Saturation Ratios: 12 % (ref 10.4–31.8)
TIBC: 356 ug/dL (ref 250–450)
UIBC: 313 ug/dL

## 2024-03-17 LAB — FERRITIN: Ferritin: 113 ng/mL (ref 11–307)

## 2024-03-17 NOTE — Progress Notes (Signed)
 Hematology/Oncology Consult note [facility]  Telephone:([phone] Fax:([phone]  Patient Care Team: Rex Castor, MD as PCP - General (Internal Medicine) Avonne Boettcher, MD as Consulting Physician (Oncology)   Name of the patient: Autumn Burton  [id number]  Jan 28, 1956   Date of visit: 03/17/24  Diagnosis-iron deficiency anemia  Chief complaint/ Reason for visit-routine follow-up of iron deficiency anemia  Heme/Onc history: patient is a 68 year old female with a past medical history significant for rheumatoid arthritis, obesity hypertension among other medical problems.   Patient's baseline hemoglobin runs around 10-11 since March 2021.  Prior to that her hemoglobin was closer to 12.  However since her knee replacement surgery her hemoglobin dropped from 10-7.1 gradually and had to be given a unit of blood transfusion.  White count and platelets normal.  MCV 97.7.   Results of blood work from 06/18/2021 were as follows: CBC showed white count of 9, H&H of 8.9/28 with a platelet count of 309.  Serum creatinine mildly elevated at 1.29.  Folate levels normal.  B12 levels low at 239.  Ferritin levels elevated at 376 and iron saturation 19% with a normal TIBC.  Reticulocyte count normal for the degree of anemia at 3.6.  Myeloma panel showed no M protein.  Haptoglobin mildly elevated at 372.  TSH normal.    Interval history-overall she is doing well and denies any blood loss in her stool or urine.  She follows up with nephrology for her CKD as well.  ECOG PS- 1 Pain scale- 0   Review of systems- Review of Systems  Constitutional:  Negative for chills, fever, malaise/fatigue and weight loss.  HENT:  Negative for congestion, ear discharge and nosebleeds.   Eyes:  Negative for blurred vision.  Respiratory:  Negative for cough, hemoptysis, sputum production, shortness of breath and wheezing.   Cardiovascular:  Negative for chest pain, palpitations,  orthopnea and claudication.  Gastrointestinal:  Negative for abdominal pain, blood in stool, constipation, diarrhea, heartburn, melena, nausea and vomiting.  Genitourinary:  Negative for dysuria, flank pain, frequency, hematuria and urgency.  Musculoskeletal:  Negative for back pain, joint pain and myalgias.  Skin:  Negative for rash.  Neurological:  Negative for dizziness, tingling, focal weakness, seizures, weakness and headaches.  Endo/Heme/Allergies:  Does not bruise/bleed easily.  Psychiatric/Behavioral:  Negative for depression and suicidal ideas. The patient does not have insomnia.       Allergies  Allergen Reactions   Latex Hives, Itching and Rash    Redness     Past Medical History:  Diagnosis Date   Anemia    Arthritis    Chronic kidney disease    stage 3   Diabetes mellitus without complication (HCC)    Eczema    GERD (gastroesophageal reflux disease)    Gout    Headache    Hypertension    Lymphedema of both lower extremities    being followed by Dr Sulema Endo Boots bil   Morbid obesity with BMI of 45.0-49.9, adult (HCC)    Venous ulcer of right leg (HCC)    thigh-being followed by dr dew     Past Surgical History:  Procedure Laterality Date   COLONOSCOPY WITH PROPOFOL  N/A 10/28/2016   Procedure: COLONOSCOPY WITH PROPOFOL ;  Surgeon: Luke Salaam, MD;  Location: [facility] ENDOSCOPY;  Service: Endoscopy;  Laterality: N/A;   COLONOSCOPY WITH PROPOFOL  N/A 03/18/2022   Procedure: COLONOSCOPY WITH PROPOFOL ;  Surgeon: Luke Salaam, MD;  Location: [facility] ENDOSCOPY;  Service: Gastroenterology;  Laterality: N/A;   DG RIGHT TIBIA AND FIBULA ([facility] HX)     LEG SURGERY Right    skin grafting to right thigh     TOTAL KNEE ARTHROPLASTY Left 06/05/2021   Procedure: TOTAL KNEE ARTHROPLASTY;  Surgeon: Molli Angelucci, MD;  Location: [facility] ORS;  Service: Orthopedics;  Laterality: Left;   TOTAL KNEE ARTHROPLASTY Right 10/30/2021   Procedure: TOTAL KNEE ARTHROPLASTY;  Surgeon: Molli Angelucci,  MD;  Location: [facility] ORS;  Service: Orthopedics;  Laterality: Right;    Social History   Socioeconomic History   Marital status: Widowed    Spouse name: Not on file   Number of children: 1   Years of education: Not on file   Highest education level: Not on file  Occupational History   Not on file  Tobacco Use   Smoking status: Never   Smokeless tobacco: Never  Vaping Use   Vaping status: Never Used  Substance and Sexual Activity   Alcohol use: No    Alcohol/week: 0.0 standard drinks of alcohol   Drug use: No   Sexual activity: Not on file  Other Topics Concern   Not on file  Social History Narrative   Live alone.  Sisters, son live close by.   Social Drivers of Corporate investment banker Strain: Low Risk  (03/15/2024)   Received from [facility]   Overall Financial Resource Strain (CARDIA)    Difficulty of Paying Living Expenses: Not hard at all  Food Insecurity: No Food Insecurity (03/15/2024)   Received from [facility]   Hunger Vital Sign    Worried About Running Out of Food in the Last Year: Never true    Ran Out of Food in the Last Year: Never true  Transportation Needs: No Transportation Needs (03/15/2024)   Received from [facility] - Transportation    In the past 12 months, has lack of transportation kept you from medical appointments or from getting medications?: No    Lack of Transportation (Non-Medical): No  Physical Activity: Not on file  Stress: Not on file  Social Connections: Not on file  Intimate Partner Violence: Not on file    Family History  Problem Relation Age of Onset   Diabetes Mother    COPD Mother    Heart disease Mother    Healthy Sister    Healthy Son      Current Outpatient Medications:    aspirin  EC 81 MG tablet, Take 81 mg by mouth daily. Swallow whole., Disp: , Rfl:    calcium  carbonate (TUMS - DOSED IN MG ELEMENTAL CALCIUM ) 500 MG chewable tablet, Chew 1 tablet by  mouth daily as needed for indigestion or heartburn., Disp: , Rfl:    cholecalciferol (VITAMIN D3) 25 MCG (1000 UNIT) tablet, Take 1,000 Units by mouth daily., Disp: , Rfl:    dapagliflozin propanediol (FARXIGA) 10 MG TABS tablet, Take 1 tablet by mouth daily., Disp: , Rfl:    ferrous sulfate  325 (65 FE) MG tablet, Take 325 mg by mouth daily with breakfast., Disp: , Rfl:    furosemide  (LASIX ) 20 MG tablet, Take 1 tablet by mouth daily., Disp: , Rfl:    levocetirizine (XYZAL) 5 MG tablet, Take 1 tablet by mouth every evening., Disp: , Rfl:    methocarbamol  (ROBAXIN ) 500 MG tablet, Take 1 tablet (500 mg total) by mouth every 6 (six) hours as needed for muscle spasms., Disp: , Rfl:    olmesartan -hydrochlorothiazide  (BENICAR   HCT) 40-12.5 MG tablet, Take 1 tablet by mouth daily., Disp: , Rfl:    potassium chloride  (KLOR-CON ) 10 MEQ tablet, TAKE 1 TABLET BY MOUTH ONCE DAILY. TAKE WITH FUROSEMIDE . (Patient taking differently: Take 10 mEq by mouth daily.), Disp: 90 tablet, Rfl: 1   triamcinolone  ointment (KENALOG ) 0.1 %, Apply 1 application topically daily as needed (eczema flares)., Disp: , Rfl:    vitamin B-12 (CYANOCOBALAMIN ) 1000 MCG tablet, Take 1,000 mcg by mouth daily., Disp: , Rfl:    tirzepatide (MOUNJARO) 2.5 MG/0.5ML Pen, Inject 2.5 mg into the skin. (Patient not taking: Reported on 03/17/2024), Disp: , Rfl:   Physical exam:  Vitals:   03/17/24 1345  BP: (!) 159/93  Pulse: 81  Resp: 16  Temp: 97.6 F (36.4 C)  TempSrc: Tympanic  SpO2: 100%  Weight: 290 lb (131.5 kg)   Physical Exam Cardiovascular:     Rate and Rhythm: Normal rate and regular rhythm.     Heart sounds: Normal heart sounds.  Pulmonary:     Effort: Pulmonary effort is normal.     Breath sounds: Normal breath sounds.  Skin:    General: Skin is warm and dry.  Neurological:     Mental Status: She is alert and oriented to person, place, and time.      I have personally reviewed labs listed below:    Latest Ref Rng &  Units 03/17/2024    1:09 PM  CMP  Glucose 70 - 99 mg/dL 811   BUN 8 - 23 mg/dL 34   Creatinine 9.14 - 1.00 mg/dL 7.82   Sodium 956 - 213 mmol/L 136   Potassium 3.5 - 5.1 mmol/L 3.8   Chloride 98 - 111 mmol/L 100   CO2 22 - 32 mmol/L 22   Calcium  8.9 - 10.3 mg/dL 8.9   Total Protein 6.5 - 8.1 g/dL 7.9   Total Bilirubin 0.0 - 1.2 mg/dL 0.3   Alkaline Phos 38 - 126 U/L 77   AST 15 - 41 U/L 18   ALT 0 - 44 U/L 13       Latest Ref Rng & Units 03/17/2024    1:09 PM  CBC  WBC 4.0 - 10.5 K/uL 6.8   Hemoglobin 12.0 - 15.0 g/dL 08.6   Hematocrit 57.8 - 46.0 % 38.1   Platelets 150 - 400 K/uL 246       Assessment and plan- Patient is a 68 y.o. female here for routine follow-up of iron deficiency anemia  Patient has not Required any IV iron so far.  Her hemoglobin has remained stable between 12-14 since November 2024.  Iron studies are currently pending.  She also has a component of chronic kidney disease which may potentially worsen her anemia in the future.  However given that her counts have overall remained stable she can continue to follow-up with her primary care doctor and nephrology at this time.  She can be referred to us  in the future if questions or concerns arise   Visit Diagnosis 1. Iron deficiency anemia, unspecified iron deficiency anemia type      Dr. Seretha Dance, MD, MPH [facility] at [facility] [phone] 03/17/2024 2:18 PM

## 2024-03-17 NOTE — Progress Notes (Signed)
 Pt seen at [facility] clinic last week and had CMP and A1C drawn.  Pt recently saw Dr Kapoor for elevated BUN.  Pt had several medication updates since last seen a year ago. Reports swelling in lower extremities and lasix  was increased for this week to 2 a day.

## 2024-04-27 DIAGNOSIS — L2089 Other atopic dermatitis: Secondary | ICD-10-CM | POA: Diagnosis not present

## 2024-05-20 DIAGNOSIS — N1831 Chronic kidney disease, stage 3a: Secondary | ICD-10-CM | POA: Diagnosis not present

## 2024-05-20 DIAGNOSIS — I890 Lymphedema, not elsewhere classified: Secondary | ICD-10-CM | POA: Diagnosis not present

## 2024-05-20 DIAGNOSIS — N183 Chronic kidney disease, stage 3 unspecified: Secondary | ICD-10-CM | POA: Diagnosis not present

## 2024-05-20 DIAGNOSIS — E1122 Type 2 diabetes mellitus with diabetic chronic kidney disease: Secondary | ICD-10-CM | POA: Diagnosis not present

## 2024-05-20 DIAGNOSIS — R0789 Other chest pain: Secondary | ICD-10-CM | POA: Diagnosis not present

## 2024-05-20 DIAGNOSIS — M069 Rheumatoid arthritis, unspecified: Secondary | ICD-10-CM | POA: Diagnosis not present

## 2024-05-25 DIAGNOSIS — L2084 Intrinsic (allergic) eczema: Secondary | ICD-10-CM | POA: Diagnosis not present

## 2024-06-15 DIAGNOSIS — N1832 Chronic kidney disease, stage 3b: Secondary | ICD-10-CM | POA: Diagnosis not present

## 2024-06-15 DIAGNOSIS — I10 Essential (primary) hypertension: Secondary | ICD-10-CM | POA: Diagnosis not present

## 2024-06-15 DIAGNOSIS — E785 Hyperlipidemia, unspecified: Secondary | ICD-10-CM | POA: Diagnosis not present

## 2024-06-15 DIAGNOSIS — E669 Obesity, unspecified: Secondary | ICD-10-CM | POA: Diagnosis not present

## 2024-06-15 DIAGNOSIS — E1122 Type 2 diabetes mellitus with diabetic chronic kidney disease: Secondary | ICD-10-CM | POA: Diagnosis not present

## 2024-06-24 DIAGNOSIS — E119 Type 2 diabetes mellitus without complications: Secondary | ICD-10-CM | POA: Diagnosis not present

## 2024-06-24 DIAGNOSIS — H2513 Age-related nuclear cataract, bilateral: Secondary | ICD-10-CM | POA: Diagnosis not present

## 2024-06-24 DIAGNOSIS — H40003 Preglaucoma, unspecified, bilateral: Secondary | ICD-10-CM | POA: Diagnosis not present

## 2024-08-19 DIAGNOSIS — N183 Chronic kidney disease, stage 3 unspecified: Secondary | ICD-10-CM | POA: Diagnosis not present

## 2024-08-19 DIAGNOSIS — E1122 Type 2 diabetes mellitus with diabetic chronic kidney disease: Secondary | ICD-10-CM | POA: Diagnosis not present

## 2024-08-19 DIAGNOSIS — N1831 Chronic kidney disease, stage 3a: Secondary | ICD-10-CM | POA: Diagnosis not present

## 2024-08-24 DIAGNOSIS — E1122 Type 2 diabetes mellitus with diabetic chronic kidney disease: Secondary | ICD-10-CM | POA: Diagnosis not present

## 2024-08-24 DIAGNOSIS — N183 Chronic kidney disease, stage 3 unspecified: Secondary | ICD-10-CM | POA: Diagnosis not present

## 2024-08-24 DIAGNOSIS — Z23 Encounter for immunization: Secondary | ICD-10-CM | POA: Diagnosis not present

## 2024-08-24 DIAGNOSIS — I890 Lymphedema, not elsewhere classified: Secondary | ICD-10-CM | POA: Diagnosis not present

## 2024-08-24 DIAGNOSIS — N1831 Chronic kidney disease, stage 3a: Secondary | ICD-10-CM | POA: Diagnosis not present

## 2024-08-24 DIAGNOSIS — M069 Rheumatoid arthritis, unspecified: Secondary | ICD-10-CM | POA: Diagnosis not present

## 2024-09-14 DIAGNOSIS — L851 Acquired keratosis [keratoderma] palmaris et plantaris: Secondary | ICD-10-CM | POA: Diagnosis not present

## 2024-09-14 DIAGNOSIS — M216X2 Other acquired deformities of left foot: Secondary | ICD-10-CM | POA: Diagnosis not present

## 2024-09-14 DIAGNOSIS — M216X1 Other acquired deformities of right foot: Secondary | ICD-10-CM | POA: Diagnosis not present

## 2024-09-14 DIAGNOSIS — E1140 Type 2 diabetes mellitus with diabetic neuropathy, unspecified: Secondary | ICD-10-CM | POA: Diagnosis not present

## 2024-09-14 DIAGNOSIS — N1831 Chronic kidney disease, stage 3a: Secondary | ICD-10-CM | POA: Diagnosis not present

## 2024-09-14 DIAGNOSIS — B351 Tinea unguium: Secondary | ICD-10-CM | POA: Diagnosis not present

## 2024-09-14 DIAGNOSIS — R8290 Unspecified abnormal findings in urine: Secondary | ICD-10-CM | POA: Diagnosis not present
